# Patient Record
Sex: Male | Born: 1965 | Race: White | Hispanic: No | Marital: Married | Smoking: Never smoker
Health system: Southern US, Community
[De-identification: ages and names within clinical notes are randomized; demographics above are authoritative.]

## PROBLEM LIST (undated history)

## (undated) DIAGNOSIS — B029 Zoster without complications: Secondary | ICD-10-CM

## (undated) DIAGNOSIS — M25539 Pain in unspecified wrist: Secondary | ICD-10-CM

## (undated) DIAGNOSIS — E8881 Metabolic syndrome: Secondary | ICD-10-CM

## (undated) DIAGNOSIS — M1990 Unspecified osteoarthritis, unspecified site: Secondary | ICD-10-CM

## (undated) DIAGNOSIS — R739 Hyperglycemia, unspecified: Secondary | ICD-10-CM

## (undated) DIAGNOSIS — R609 Edema, unspecified: Secondary | ICD-10-CM

## (undated) DIAGNOSIS — I10 Essential (primary) hypertension: Secondary | ICD-10-CM

## (undated) DIAGNOSIS — R51 Headache: Secondary | ICD-10-CM

## (undated) DIAGNOSIS — G4730 Sleep apnea, unspecified: Secondary | ICD-10-CM

## (undated) DIAGNOSIS — E250 Congenital adrenogenital disorders associated with enzyme deficiency: Secondary | ICD-10-CM

## (undated) DIAGNOSIS — E7800 Pure hypercholesterolemia, unspecified: Secondary | ICD-10-CM

## (undated) HISTORY — DX: Unspecified osteoarthritis, unspecified site: M19.90

## (undated) HISTORY — DX: Metabolic syndrome: E88.810

## (undated) HISTORY — DX: Metabolic syndrome: E88.81

## (undated) HISTORY — DX: Zoster without complications: B02.9

---

## 1984-12-20 HISTORY — PX: APPENDECTOMY: SHX54

## 1999-04-06 ENCOUNTER — Ambulatory Visit (HOSPITAL_COMMUNITY): Admission: RE | Admit: 1999-04-06 | Discharge: 1999-04-06 | Payer: Self-pay | Admitting: Family Medicine

## 1999-04-06 ENCOUNTER — Encounter: Payer: Self-pay | Admitting: Family Medicine

## 1999-12-21 HISTORY — PX: KNEE ARTHROSCOPY: SUR90

## 2000-05-31 ENCOUNTER — Ambulatory Visit (HOSPITAL_COMMUNITY): Admission: RE | Admit: 2000-05-31 | Discharge: 2000-05-31 | Payer: Self-pay | Admitting: Family Medicine

## 2000-05-31 ENCOUNTER — Encounter: Payer: Self-pay | Admitting: Family Medicine

## 2002-07-23 ENCOUNTER — Encounter: Admission: RE | Admit: 2002-07-23 | Discharge: 2002-07-23 | Payer: Self-pay | Admitting: *Deleted

## 2002-07-23 ENCOUNTER — Encounter: Payer: Self-pay | Admitting: *Deleted

## 2002-07-31 ENCOUNTER — Ambulatory Visit (HOSPITAL_BASED_OUTPATIENT_CLINIC_OR_DEPARTMENT_OTHER): Admission: RE | Admit: 2002-07-31 | Discharge: 2002-07-31 | Payer: Self-pay | Admitting: Orthopaedic Surgery

## 2004-11-19 ENCOUNTER — Encounter: Admission: RE | Admit: 2004-11-19 | Discharge: 2004-11-19 | Payer: Self-pay | Admitting: Family Medicine

## 2006-10-06 ENCOUNTER — Emergency Department (HOSPITAL_COMMUNITY): Admission: EM | Admit: 2006-10-06 | Discharge: 2006-10-06 | Payer: Self-pay | Admitting: Emergency Medicine

## 2006-11-14 ENCOUNTER — Encounter: Admission: RE | Admit: 2006-11-14 | Discharge: 2007-02-12 | Payer: Self-pay | Admitting: Specialist

## 2007-03-27 ENCOUNTER — Encounter: Admission: RE | Admit: 2007-03-27 | Discharge: 2007-06-25 | Payer: Self-pay | Admitting: Specialist

## 2008-05-24 ENCOUNTER — Emergency Department (HOSPITAL_COMMUNITY): Admission: AC | Admit: 2008-05-24 | Discharge: 2008-05-24 | Payer: Self-pay | Admitting: Emergency Medicine

## 2011-01-09 ENCOUNTER — Encounter: Payer: Self-pay | Admitting: Family Medicine

## 2011-05-07 NOTE — Op Note (Signed)
NAME:  Brian Newton, Brian Newton NO.:  [id number]   MEDICAL RECORD NO.:  [id number]                    PATIENT TYPE:   LOCATION:                                       FACILITY:   PHYSICIAN:  Lubertha Basque. Jerl Santos, M.D.             DATE OF BIRTH:   DATE OF PROCEDURE:  DATE OF DISCHARGE:                                 OPERATIVE REPORT   DIAGNOSES:  1. Right knee torn medial meniscus.  2. Right knee chondromalacia.   PROCEDURES:  1. Right knee partial medial meniscectomy.  2. Right knee chondroplasty of patellofemoral joint.   ANESTHESIA:  Knee block and MAC.   ATTENDING SURGEON:  Lubertha Basque. Jerl Santos, M.D.   ASSISTANT:  Prince Rome, P.A.   INDICATIONS FOR PROCEDURE:  The patient is a 45 year old male with a long  history of right knee pain and swelling.  This has persisted despite oral  anti-inflammatories, a brace, and injection which did help for a short  period of time.  He has undergone an MRI scan, which shows a complex tear of  the posterior horn of the medial meniscus.  He is offered an arthroscopy.  The procedure was discussed with the patient and an informed operative  consent was obtained, after discussion with possible complications of  reaction to anesthesia and infection.   DESCRIPTION OF PROCEDURE:  The patient was taken to the operating suite,  where a knee block was applied without difficulty.  He was also given some  MAC.  He was positioned supine and prepped and draped in normal sterile  fashion.  After the administration of preoperative antibiotics, an  arthroscopy of the right knee was performed through two inferior portals.  Suprapatellar pouch, one with __________ mild chondromalacia addressed with  a brief chondroplasty.  Medial compartment exhibited a complex tear of the  posterior horn of the medial meniscus.  This was also involved with a  portion of the middle horn.  A 15% partial medial meniscectomy was  performed.  There  were minimal degenerative changes in this compartment.  Lateral compartment was completely benign and the ACL and PCL were intact in  the notch.  The knee was thoroughly irrigated at the end of the case,  followed the placement of Marcaine with epinephrine and morphine.  Adaptic  was sewn through the portals, followed by dry gauze and a loose Ace wrap.  The estimated blood loss and intraoperative fluids can be obtained from the  anesthesia records.   DISPOSITION:  The patient was taken to recovery room in stable condition.    PLAN:  It was allowed for him to go home the same day and follow up in the  office in one week.  I will contact him by phone tonight.  Lubertha Basque Jerl Santos, M.D.    PGD/MEDQ  D:  07/31/2002  T:  08/02/2002  Job:  [id number]

## 2011-09-16 LAB — POCT I-STAT, CHEM 8
Chloride: 105
Glucose, Bld: 123 — ABNORMAL HIGH
HCT: 50
Hemoglobin: 17
Potassium: 3.6
Sodium: 139

## 2011-09-16 LAB — URINALYSIS, ROUTINE W REFLEX MICROSCOPIC
Nitrite: NEGATIVE
Specific Gravity, Urine: 1.016
Urobilinogen, UA: 1
pH: 7

## 2011-09-16 LAB — DIFFERENTIAL
Basophils Absolute: 0
Basophils Relative: 1
Eosinophils Absolute: 0.2
Monocytes Relative: 8
Neutro Abs: 4.6
Neutrophils Relative %: 57

## 2011-09-16 LAB — CBC
MCHC: 34.5
Platelets: 255
RBC: 5.02

## 2013-01-24 ENCOUNTER — Other Ambulatory Visit: Payer: Self-pay | Admitting: Gastroenterology

## 2013-02-23 ENCOUNTER — Ambulatory Visit (HOSPITAL_COMMUNITY)
Admission: RE | Admit: 2013-02-23 | Payer: Federal, State, Local not specified - PPO | Source: Ambulatory Visit | Admitting: Gastroenterology

## 2013-02-23 ENCOUNTER — Encounter (HOSPITAL_COMMUNITY): Admission: RE | Payer: Self-pay | Source: Ambulatory Visit

## 2013-02-23 SURGERY — COLONOSCOPY
Anesthesia: Moderate Sedation

## 2013-02-26 ENCOUNTER — Other Ambulatory Visit: Payer: Self-pay | Admitting: Gastroenterology

## 2013-03-01 ENCOUNTER — Encounter (HOSPITAL_COMMUNITY): Payer: Self-pay

## 2013-03-02 ENCOUNTER — Encounter (HOSPITAL_COMMUNITY)
Admission: RE | Disposition: A | Discharge status: Home / Self Care | Payer: Self-pay | Source: Ambulatory Visit | Attending: Gastroenterology

## 2013-03-02 ENCOUNTER — Encounter (HOSPITAL_COMMUNITY): Payer: Self-pay

## 2013-03-02 ENCOUNTER — Ambulatory Visit (HOSPITAL_COMMUNITY)
Admission: RE | Admit: 2013-03-02 | Discharge: 2013-03-02 | Disposition: A | Discharge status: Home / Self Care | Payer: Federal, State, Local not specified - PPO | Source: Ambulatory Visit | Attending: Gastroenterology | Admitting: Gastroenterology

## 2013-03-02 DIAGNOSIS — E7800 Pure hypercholesterolemia, unspecified: Secondary | ICD-10-CM | POA: Insufficient documentation

## 2013-03-02 DIAGNOSIS — D371 Neoplasm of uncertain behavior of stomach: Secondary | ICD-10-CM | POA: Insufficient documentation

## 2013-03-02 DIAGNOSIS — K5730 Diverticulosis of large intestine without perforation or abscess without bleeding: Secondary | ICD-10-CM | POA: Insufficient documentation

## 2013-03-02 DIAGNOSIS — I10 Essential (primary) hypertension: Secondary | ICD-10-CM | POA: Insufficient documentation

## 2013-03-02 HISTORY — DX: Congenital adrenogenital disorders associated with enzyme deficiency: E25.0

## 2013-03-02 HISTORY — DX: Essential (primary) hypertension: I10

## 2013-03-02 HISTORY — DX: Pure hypercholesterolemia, unspecified: E78.00

## 2013-03-02 HISTORY — DX: Headache: R51

## 2013-03-02 HISTORY — PX: COLONOSCOPY: SHX5424

## 2013-03-02 SURGERY — COLONOSCOPY
Anesthesia: Moderate Sedation

## 2013-03-02 MED ORDER — FENTANYL CITRATE 0.05 MG/ML IJ SOLN
INTRAMUSCULAR | Status: AC
  Filled 2013-03-02: qty 2

## 2013-03-02 MED ORDER — FENTANYL CITRATE 0.05 MG/ML IJ SOLN
INTRAMUSCULAR | Status: DC | PRN
  Administered 2013-03-02 (×3): 25 ug via INTRAVENOUS

## 2013-03-02 MED ORDER — MIDAZOLAM HCL 10 MG/2ML IJ SOLN
INTRAMUSCULAR | Status: AC
  Filled 2013-03-02: qty 2

## 2013-03-02 MED ORDER — MIDAZOLAM HCL 5 MG/5ML IJ SOLN
INTRAMUSCULAR | Status: DC | PRN
  Administered 2013-03-02: 2 mg via INTRAVENOUS
  Administered 2013-03-02: 3 mg via INTRAVENOUS
  Administered 2013-03-02: 2 mg via INTRAVENOUS

## 2013-03-02 MED ORDER — DIPHENHYDRAMINE HCL 50 MG/ML IJ SOLN
INTRAMUSCULAR | Status: AC
  Filled 2013-03-02: qty 1

## 2013-03-02 MED ORDER — SODIUM CHLORIDE 0.9 % IV SOLN
INTRAVENOUS | Status: DC
  Administered 2013-03-02: 500 mL via INTRAVENOUS

## 2013-03-02 NOTE — H&P (Signed)
  Brian Newton HPI: The patient has a history of an adenoma in the past and he is here to have a repeat colonoscopy.  As a result of his weight gain he is undergoing the procedure at the hospital.  Past Medical History  Diagnosis Date  . Hypertension   . 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 Headache   . Hypercholesteremia     Past Surgical History  Procedure Laterality Date  . Appendectomy    . Ankle arthroscopy      History reviewed. No pertinent family history.  Social History:  has no tobacco, alcohol, and drug history on file.  Allergies:  Allergies  Allergen Reactions  . Penicillins Rash    Medications:  Scheduled:  Continuous: . sodium chloride 500 mL (03/02/13 1027)    No results found for this or any previous visit (from the past 24 hour(s)).   No results found.  ROS:  As stated above in the HPI otherwise negative.  Blood pressure 151/92, pulse 76, temperature 98.2 F (36.8 C), temperature source Oral, resp. rate 21, height 5\' 5"  (1.651 m), weight 330 lb (149.687 kg), SpO2 94.00%.    PE: Gen: NAD, Alert and Oriented HEENT:  /AT, EOMI Neck: Supple, no LAD, thick neck Lungs: CTA Bilaterally CV: RRR without M/G/R ABM: Soft, morbidly obese, +BS Ext: No C/C/E  Assessment/Plan: 1) Personal history of polyps.  Plan: 1) Colonoscopy.  HUNG,PATRICK D 03/02/2013, 11:37 AM

## 2013-03-02 NOTE — Op Note (Signed)
[facility], [facility] [address] [place], [address]   COLONOSCOPY PROCEDURE REPORT  PATIENT: Brian Newton, Brian Newton  MR#: [id number] BIRTHDATE: [birth year] , 46  yrs. old GENDER: Male ENDOSCOPIST: Jeani Hawking, MD REFERRED BY: PROCEDURE DATE:  03/02/2013 PROCEDURE:   Colonoscopy with snare polypectomy ASA CLASS:   Class III INDICATIONS:Patient's personal history of colon polyps. MEDICATIONS: Versed 7 mg IV and Fentanyl 75 mcg IV  DESCRIPTION OF PROCEDURE:   After the risks benefits and alternatives of the procedure were thoroughly explained, informed consent was obtained.  A digital rectal exam revealed no abnormalities of the rectum.   The Pentax Colonoscope [id number] endoscope was introduced through the anus and advanced to the cecum, which was identified by both the appendix and ileocecal valve. No adverse events experienced.   The quality of the prep was good.  The instrument was then slowly withdrawn as the colon was fully examined.      FINDINGS: A 3 mm sessile proximal transverse colon polyp was removed with a cold snare.  A few left sided diverticula were identified. No other abnormalities noted.  Retroflexed views revealed internal/external hemorrhoids. The time to cecum=  .  Withdrawal time=  .  The scope was withdrawn and the procedure completed. COMPLICATIONS: There were no complications.  ENDOSCOPIC IMPRESSION: 1) Transverse colon polyp, but the specimen was not able to be retrieved.  It broke up during the suctioning process.  RECOMMENDATIONS: 1) Repeat the colonoscopy in 5 years.   eSigned:  Jeani Hawking, MD 03/02/2013 12:25 PM   cc:

## 2013-03-02 NOTE — Progress Notes (Signed)
Polyp removed by Dr Elnoria Howard with cold snare and suctioned into trap . Fluid  Passed through filter no polyp found . Dr Elnoria Howard notified polyp not retrived.

## 2013-03-05 ENCOUNTER — Encounter (HOSPITAL_COMMUNITY): Payer: Self-pay | Admitting: Gastroenterology

## 2013-04-03 ENCOUNTER — Telehealth: Payer: Self-pay | Admitting: Family Medicine

## 2013-04-03 MED ORDER — PRAVASTATIN SODIUM 40 MG PO TABS: 40.0000 mg | ORAL_TABLET | Freq: Every day | ORAL | Status: DC

## 2013-04-03 MED ORDER — AMLODIPINE BESY-BENAZEPRIL HCL 5-20 MG PO CAPS: 2.0000 | ORAL_CAPSULE | Freq: Every morning | ORAL | Status: DC

## 2013-04-03 NOTE — Telephone Encounter (Signed)
Rx Refilled  

## 2013-04-11 ENCOUNTER — Other Ambulatory Visit (INDEPENDENT_AMBULATORY_CARE_PROVIDER_SITE_OTHER): Payer: Federal, State, Local not specified - PPO

## 2013-04-11 ENCOUNTER — Other Ambulatory Visit: Payer: Self-pay | Admitting: Family Medicine

## 2013-04-11 DIAGNOSIS — E785 Hyperlipidemia, unspecified: Secondary | ICD-10-CM

## 2013-04-11 DIAGNOSIS — E291 Testicular hypofunction: Secondary | ICD-10-CM

## 2013-04-11 DIAGNOSIS — Z0000 Encounter for general adult medical examination without abnormal findings: Secondary | ICD-10-CM

## 2013-04-11 DIAGNOSIS — Z79899 Other long term (current) drug therapy: Secondary | ICD-10-CM

## 2013-04-11 DIAGNOSIS — I10 Essential (primary) hypertension: Secondary | ICD-10-CM

## 2013-04-11 LAB — CBC WITH DIFFERENTIAL/PLATELET
Basophils Absolute: 0 10*3/uL (ref 0.0–0.1)
Eosinophils Relative: 3 % (ref 0–5)
HCT: 44.5 % (ref 39.0–52.0)
Hemoglobin: 14.8 g/dL (ref 13.0–17.0)
Lymphocytes Relative: 46 % (ref 12–46)
MCV: 89 fL (ref 78.0–100.0)
Monocytes Absolute: 0.4 10*3/uL (ref 0.1–1.0)
Monocytes Relative: 7 % (ref 3–12)
RDW: 14.5 % (ref 11.5–15.5)
WBC: 5.8 10*3/uL (ref 4.0–10.5)

## 2013-04-11 LAB — LIPID PANEL
HDL: 45 mg/dL (ref >39)
LDL Cholesterol: 70 mg/dL (ref 0–99)
Total CHOL/HDL Ratio: 2.8 Ratio
VLDL: 11 mg/dL (ref 0–40)

## 2013-04-11 LAB — TESTOSTERONE: Testosterone: 162 ng/dL — ABNORMAL LOW (ref 300–890)

## 2013-04-12 LAB — COMPLETE METABOLIC PANEL WITH GFR
AST: 21 U/L (ref 0–37)
Albumin: 4.1 g/dL (ref 3.5–5.2)
Alkaline Phosphatase: 73 U/L (ref 39–117)
BUN: 14 mg/dL (ref 6–23)
Potassium: 4.5 mEq/L (ref 3.5–5.3)
Sodium: 139 mEq/L (ref 135–145)
Total Protein: 6.5 g/dL (ref 6.0–8.3)

## 2013-04-19 ENCOUNTER — Ambulatory Visit: Payer: Self-pay | Admitting: Family Medicine

## 2013-04-25 ENCOUNTER — Encounter: Payer: Self-pay | Admitting: Family Medicine

## 2013-04-25 ENCOUNTER — Ambulatory Visit (INDEPENDENT_AMBULATORY_CARE_PROVIDER_SITE_OTHER): Payer: Federal, State, Local not specified - PPO | Admitting: Family Medicine

## 2013-04-25 VITALS — BP 132/80 | HR 72 | Temp 98.0°F | Resp 20 | Wt 342.0 lb

## 2013-04-25 DIAGNOSIS — I10 Essential (primary) hypertension: Secondary | ICD-10-CM

## 2013-04-25 DIAGNOSIS — E291 Testicular hypofunction: Secondary | ICD-10-CM

## 2013-04-25 DIAGNOSIS — E250 Congenital adrenogenital disorders associated with enzyme deficiency: Secondary | ICD-10-CM | POA: Insufficient documentation

## 2013-04-25 DIAGNOSIS — M7750 Other enthesopathy of unspecified foot: Secondary | ICD-10-CM

## 2013-04-25 DIAGNOSIS — M7742 Metatarsalgia, left foot: Secondary | ICD-10-CM

## 2013-04-25 DIAGNOSIS — E7800 Pure hypercholesterolemia, unspecified: Secondary | ICD-10-CM | POA: Insufficient documentation

## 2013-04-25 DIAGNOSIS — E8881 Metabolic syndrome: Secondary | ICD-10-CM

## 2013-04-25 DIAGNOSIS — B353 Tinea pedis: Secondary | ICD-10-CM

## 2013-04-25 NOTE — Progress Notes (Signed)
Subjective:    Patient ID: Brian Newton, male    DOB: 02/27/1966, 47 y.o.   MRN: [id number]  HPI Patient reports pain in the lateral aspect of his left foot along the sixth menopausal now for several months. He denies any specific injury. He denies any swelling or erythema. The pain is burning and constant in nature. It is worse with prolonged standing and walking. He tried over-the-counter anti-inflammatories with little success. He also has significant athlete's foot on both feet. Is a red rash with serpiginous borders and white scale. It covers all his toes and the plantar aspects of both feet. He is also discuss his metabolic syndrome, hyperlipidemia, and hypertension. His medication list is reviewed and I reviewed a CMP, fasting lipid panel, and testosterone level with the patient. His labs were significant for an elevated blood sugar of 105. Have his fasting lipid panel looks excellent. His LDL is well below 100. His triglycerides are within normal limits. He is no evidence of liver irritation. He denies any right upper quadrant pain or myalgias on pravastatin. His blood pressure is currently well controlled. He denies any chest pain short of breath or dyspnea on exertion. Past Medical History  Diagnosis Date  . 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 Headache   . Hypercholesteremia   . Hypertension   . Metabolic syndrome    Current Outpatient Prescriptions on File Prior to Visit  Medication Sig Dispense Refill  . amLODipine-benazepril (LOTREL) 5-20 MG per capsule Take 2 capsules by mouth every morning.  60 capsule  5  . aspirin 81 MG tablet Take 81 mg by mouth daily.      . cholecalciferol (VITAMIN D) 1000 UNITS tablet Take 1,000 Units by mouth daily.      Marland Kitchen glucosamine-chondroitin 500-400 MG tablet Take 1 tablet by mouth every morning.      . loratadine (CLARITIN) 10 MG tablet Take 10 mg by mouth every morning.      . metoprolol succinate (TOPROL-XL) 25 MG 24 hr tablet  Take 25 mg by mouth every morning.      . Multiple Vitamins-Minerals (MULTIVITAMIN WITH MINERALS) tablet Take 1 tablet by mouth daily.      . Omega-3 Fatty Acids (FISH OIL) 1000 MG CAPS Take 1,000 mg by mouth daily.      . pravastatin (PRAVACHOL) 40 MG tablet Take 1 tablet (40 mg total) by mouth daily.  30 tablet  5   No current facility-administered medications on file prior to visit.   Allergies  Allergen Reactions  . Penicillins Rash   History   Social History  . Marital Status: Married    Spouse Name: N/A    Number of Children: N/A  . Years of Education: N/A   Occupational History  . Not on file.   Social History Main Topics  . Smoking status: Never Smoker   . Smokeless tobacco: Not on file  . Alcohol Use: No  . Drug Use: No  . Sexually Active: Not on file   Other Topics Concern  . Not on file   Social History Narrative  . No narrative on file      Review of Systems  All other systems reviewed and are negative.       Objective:   Physical Exam  Constitutional: He is oriented to person, place, and time. He appears well-developed and well-nourished.  Cardiovascular: Normal rate, regular rhythm and normal heart sounds.   No murmur heard. Pulmonary/Chest: Effort normal and breath sounds normal.  No respiratory distress. He has no wheezes. He has no rales.  Abdominal: Soft. Bowel sounds are normal. He exhibits no distension. There is no tenderness. There is no rebound and no guarding.  Musculoskeletal: He exhibits tenderness.  Neurological: He is alert and oriented to person, place, and time.  Skin: Rash noted.   patient has significant tenia pedis on both feet.  I watched the patient ambulate in the hallway. He suppinates his feet when he walks. He carries his weight primarily on the fifth metatarsal. He does not pronate with his gait.        Assessment & Plan:  1. Metabolic syndrome Discussed therapeutic lifestyle changes including weight loss. Also  recommended the patient discuss bariatric surgery with [facility].  Patient states he will call the clinic and possibly attend mother seminars.  2. HTN (hypertension) Blood pressure is currently well controlled continue present medications.  3. Hypogonadism male Testosterone remains low, but the patient defers treatment as he saw little benefit when he took testosterone in the past and his hemoglobin became polycythemic.  4. Morbid obesity See problem number1  5. Tinea pedis Lotrimin cream twice a day x2 weeks  6. Metatarsalgia, left The patient would benefit from orthotics which we correct his hyper supination and take pressure off the lateral aspect of his midfoot. - Ambulatory referral to Podiatry

## 2013-05-17 ENCOUNTER — Ambulatory Visit: Payer: Federal, State, Local not specified - PPO | Admitting: Physician Assistant

## 2013-05-25 ENCOUNTER — Other Ambulatory Visit: Payer: Self-pay | Admitting: Family Medicine

## 2013-05-25 NOTE — Telephone Encounter (Signed)
Medication refilled per protocol. 

## 2013-07-19 ENCOUNTER — Other Ambulatory Visit: Payer: Self-pay | Admitting: Family Medicine

## 2013-07-19 NOTE — Telephone Encounter (Signed)
Medication refilled per protocol. 

## 2013-10-18 ENCOUNTER — Other Ambulatory Visit: Payer: Federal, State, Local not specified - PPO

## 2013-10-18 ENCOUNTER — Telehealth: Payer: Self-pay | Admitting: Family Medicine

## 2013-10-18 ENCOUNTER — Other Ambulatory Visit: Payer: Self-pay | Admitting: Family Medicine

## 2013-10-18 DIAGNOSIS — E8881 Metabolic syndrome: Secondary | ICD-10-CM

## 2013-10-18 DIAGNOSIS — E785 Hyperlipidemia, unspecified: Secondary | ICD-10-CM

## 2013-10-18 DIAGNOSIS — I10 Essential (primary) hypertension: Secondary | ICD-10-CM

## 2013-10-18 DIAGNOSIS — Z79899 Other long term (current) drug therapy: Secondary | ICD-10-CM

## 2013-10-18 LAB — CBC WITH DIFFERENTIAL/PLATELET
Basophils Absolute: 0 10*3/uL (ref 0.0–0.1)
HCT: 46 % (ref 39.0–52.0)
Hemoglobin: 15.3 g/dL (ref 13.0–17.0)
Lymphocytes Relative: 36 % (ref 12–46)
Monocytes Absolute: 0.8 10*3/uL (ref 0.1–1.0)
Neutro Abs: 3.5 10*3/uL (ref 1.7–7.7)
RBC: 5.32 MIL/uL (ref 4.22–5.81)
RDW: 14.8 % (ref 11.5–15.5)
WBC: 6.9 10*3/uL (ref 4.0–10.5)

## 2013-10-18 LAB — COMPLETE METABOLIC PANEL WITH GFR
AST: 24 U/L (ref 0–37)
Alkaline Phosphatase: 74 U/L (ref 39–117)
BUN: 20 mg/dL (ref 6–23)
Creat: 0.94 mg/dL (ref 0.50–1.35)
GFR, Est Non African American: >89 mL/min
Glucose, Bld: 93 mg/dL (ref 70–99)
Potassium: 4.8 mEq/L (ref 3.5–5.3)
Total Bilirubin: 0.6 mg/dL (ref 0.3–1.2)

## 2013-10-18 LAB — LIPID PANEL
Cholesterol: 157 mg/dL (ref 0–200)
Total CHOL/HDL Ratio: 3.3 Ratio
Triglycerides: 97 mg/dL (ref <150)
VLDL: 19 mg/dL (ref 0–40)

## 2013-10-18 MED ORDER — AMLODIPINE BESY-BENAZEPRIL HCL 5-20 MG PO CAPS: 2.0000 | ORAL_CAPSULE | Freq: Every morning | ORAL | Status: DC

## 2013-10-18 MED ORDER — PRAVASTATIN SODIUM 40 MG PO TABS: 40.0000 mg | ORAL_TABLET | Freq: Every day | ORAL | Status: DC

## 2013-10-18 NOTE — Telephone Encounter (Signed)
Needs Amlodipine and Prevastatin called in

## 2013-10-18 NOTE — Telephone Encounter (Signed)
Rx Refilled  

## 2013-10-25 ENCOUNTER — Ambulatory Visit (INDEPENDENT_AMBULATORY_CARE_PROVIDER_SITE_OTHER): Payer: Federal, State, Local not specified - PPO | Admitting: Family Medicine

## 2013-10-25 ENCOUNTER — Encounter: Payer: Self-pay | Admitting: Family Medicine

## 2013-10-25 VITALS — BP 122/80 | HR 80 | Temp 97.3°F | Resp 20 | Ht 65.0 in | Wt 344.0 lb

## 2013-10-25 DIAGNOSIS — M25569 Pain in unspecified knee: Secondary | ICD-10-CM

## 2013-10-25 DIAGNOSIS — E8881 Metabolic syndrome: Secondary | ICD-10-CM

## 2013-10-25 DIAGNOSIS — M25561 Pain in right knee: Secondary | ICD-10-CM

## 2013-10-25 NOTE — Progress Notes (Signed)
Subjective:    Patient ID: Brian Newton, male    DOB: September 10, 1966, 47 y.o.   MRN: [id number]  HPI Patient is a very pleasant 47 year old white male with a history of hypertension, hyperlipidemia, hypogonadism, and metabolic syndrome. He is currently taking Lotrel 5/20 one by mouth daily, Toprol-XL 25 mg by mouth daily and pravastatin 40 mg by mouth daily. He denies any myalgia right quadrant pain. He denies any chest pain, shortness of breath, dyspnea on exertion. His most recent labwork as listed below. He described his blood sugar almost 10 points. Unfortunately his weight remains significantly elevated he has a BMI greater than 50. He is also complaining of medial joint line pain in his right knee. He denies any specific injury. He denies any locking or catching in the knee. He denies any laxity in the knee. He denies any erythema or effusion. This has been a gradual pain that has slowly worsened. Past Medical History  Diagnosis Date  . 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 Headache(784.0)   . Hypercholesteremia   . Hypertension   . Metabolic syndrome    Past Surgical History  Procedure Laterality Date  . Appendectomy    . Ankle arthroscopy    . Colonoscopy N/A 03/02/2013    Procedure: COLONOSCOPY;  Surgeon: Theda Belfast, MD;  Location: [facility];  Service: Endoscopy;  Laterality: N/A;   Current Outpatient Prescriptions on File Prior to Visit  Medication Sig Dispense Refill  . amLODipine-benazepril (LOTREL) 5-20 MG per capsule TAKE (2) CAPSULES BY MOUTH ONCE EVERY MORNING.  60 capsule  2  . aspirin 81 MG tablet Take 81 mg by mouth daily.      . cholecalciferol (VITAMIN D) 1000 UNITS tablet Take 1,000 Units by mouth daily.      . furosemide (LASIX) 20 MG tablet TAKE 1 TABLET BY MOUTH ONCE DAILY AS NEEDED FOR LEG SWELLING.  30 tablet  5  . glucosamine-chondroitin 500-400 MG tablet Take 1 tablet by mouth every morning.      . loratadine (CLARITIN) 10 MG tablet Take 10  mg by mouth every morning.      . metoprolol succinate (TOPROL-XL) 25 MG 24 hr tablet TAKE 1 TABLET BY MOUTH ONCE A DAY.  30 tablet  5  . Multiple Vitamins-Minerals (MULTIVITAMIN WITH MINERALS) tablet Take 1 tablet by mouth daily.      . Omega-3 Fatty Acids (FISH OIL) 1000 MG CAPS Take 1,000 mg by mouth daily.      . pravastatin (PRAVACHOL) 40 MG tablet Take 1 tablet (40 mg total) by mouth daily.  30 tablet  5   No current facility-administered medications on file prior to visit.   Allergies  Allergen Reactions  . Penicillins Rash   History   Social History  . Marital Status: Married    Spouse Name: N/A    Number of Children: N/A  . Years of Education: N/A   Occupational History  . Not on file.   Social History Main Topics  . Smoking status: Never Smoker   . Smokeless tobacco: Not on file  . Alcohol Use: No  . Drug Use: No  . Sexual Activity: Not on file   Other Topics Concern  . Not on file   Social History Narrative  . No narrative on file      Review of Systems  All other systems reviewed and are negative.       Objective:   Physical Exam  Vitals reviewed. Cardiovascular: Normal rate, regular  rhythm and intact distal pulses.  Exam reveals no gallop and no friction rub.   No murmur heard. Pulmonary/Chest: Effort normal and breath sounds normal. No respiratory distress. He has no wheezes. He has no rales. He exhibits no tenderness.  Abdominal: Soft. Bowel sounds are normal. He exhibits no distension. There is no tenderness. There is no rebound and no guarding.  Musculoskeletal: He exhibits no edema.       Right knee: He exhibits decreased range of motion. He exhibits no swelling, no effusion, no erythema, no LCL laxity, normal patellar mobility, normal meniscus and no MCL laxity. Tenderness found. Medial joint line tenderness noted. No lateral joint line, no MCL and no LCL tenderness noted.          Assessment & Plan:  1. Metabolic syndrome Regarding the  patient's metabolic syndrome, his blood pressure is excellent. His cholesterol is very good, and his blood sugar is much improved. The patient's main medical problem now is his obesity. I recommended at least 100 pounds of weight loss. I feel the patient is an excellent candidate for gastric bypass consultation. I recommended he call [facility] and attend one of their information sessions discussing the gastric bypass.  2. Right knee pain I believe the patient has osteoarthritis in the medial joint line. Discussed the options including daily NSAID therapy versus a cortisone injection in the knee. The patient elects to proceed with a cortisone injection.  Using sterile technique I injected a mixture of 2 cc of lidocaine, 2 cc of Marcaine, and 2 cc of 40 mg mL Kenalog into the right knee using a medial approach and sterile technique. The patient tolerated the procedure well without complication.

## 2013-11-19 ENCOUNTER — Other Ambulatory Visit: Payer: Self-pay | Admitting: Family Medicine

## 2013-11-19 MED ORDER — SUMATRIPTAN SUCCINATE 50 MG PO TABS: ORAL_TABLET | ORAL | Status: DC

## 2014-01-03 ENCOUNTER — Other Ambulatory Visit: Payer: Self-pay | Admitting: Family Medicine

## 2014-01-03 NOTE — Telephone Encounter (Signed)
Medication refilled per protocol. 

## 2014-04-01 ENCOUNTER — Encounter: Payer: Self-pay | Admitting: Physician Assistant

## 2014-04-01 ENCOUNTER — Ambulatory Visit (INDEPENDENT_AMBULATORY_CARE_PROVIDER_SITE_OTHER): Payer: Federal, State, Local not specified - PPO | Admitting: Physician Assistant

## 2014-04-01 VITALS — BP 156/96 | HR 106 | Temp 99.1°F | Resp 20 | Wt 352.0 lb

## 2014-04-01 DIAGNOSIS — J988 Other specified respiratory disorders: Secondary | ICD-10-CM

## 2014-04-01 DIAGNOSIS — J029 Acute pharyngitis, unspecified: Secondary | ICD-10-CM

## 2014-04-01 DIAGNOSIS — A499 Bacterial infection, unspecified: Secondary | ICD-10-CM

## 2014-04-01 DIAGNOSIS — B9689 Other specified bacterial agents as the cause of diseases classified elsewhere: Secondary | ICD-10-CM

## 2014-04-01 LAB — RAPID STREP SCREEN (MED CTR MEBANE ONLY): Streptococcus, Group A Screen (Direct): NEGATIVE

## 2014-04-01 MED ORDER — AZITHROMYCIN 250 MG PO TABS: ORAL_TABLET | ORAL | Status: DC

## 2014-04-01 NOTE — Progress Notes (Signed)
Patient ID: Brian Newton MRN: [id number], DOB: 17-Oct-1966, 48 y.o. Date of Encounter: 04/01/2014, 11:35 AM    Chief Complaint:  Chief Complaint  Patient presents with  . cough, sore thorat, congestion    x 4 days  dtr has strep throat     HPI: 48 y.o. year old morbidly obese white male presents with  symptoms as above.  Says Last week his daughter was seen here and was positive for strep. However patient says that he has different symptoms. Says he has had no significant sore throat with this entire illness.  Reports that he started to get sick on Thursday 03/28/14. At that time developed a cough. Says that since then his symptoms are only worsening. He has had worsening cough and also head and nasal congestion. He feels pressure in his face. Says that this morning he was very hoarse but had no sore throat. Says that last night he checked his temperature at different times through the night and got between 99.1-99.6.  Says that his usual work schedule is that he is off Wednesdays and Thursdays. Usually works Fridays through Tuesdays.  Because of this illness he has been out of work since Friday and is still out of work today.     Home Meds: See attached medication section for any medications that were entered at today's visit. The computer does not put those onto this list.The following list is a list of meds entered prior to today's visit.   Current Outpatient Prescriptions on File Prior to Visit  Medication Sig Dispense Refill  . amLODipine-benazepril (LOTREL) 5-20 MG per capsule TAKE (2) CAPSULES BY MOUTH ONCE EVERY MORNING.  60 capsule  2  . aspirin 81 MG tablet Take 81 mg by mouth daily.      . cholecalciferol (VITAMIN D) 1000 UNITS tablet Take 1,000 Units by mouth daily.      . furosemide (LASIX) 20 MG tablet TAKE 1 TABLET BY MOUTH ONCE DAILY AS NEEDED FOR LEG SWELLING.  30 tablet  5  . glucosamine-chondroitin 500-400 MG tablet Take 1 tablet by mouth every morning.      .  loratadine (CLARITIN) 10 MG tablet Take 10 mg by mouth every morning.      . metoprolol succinate (TOPROL-XL) 25 MG 24 hr tablet TAKE 1 TABLET BY MOUTH ONCE A DAY.  30 tablet  5  . Multiple Vitamins-Minerals (MULTIVITAMIN WITH MINERALS) tablet Take 1 tablet by mouth daily.      . Omega-3 Fatty Acids (FISH OIL) 1000 MG CAPS Take 1,000 mg by mouth daily.      . pravastatin (PRAVACHOL) 40 MG tablet Take 1 tablet (40 mg total) by mouth daily.  30 tablet  5  . SUMAtriptan (IMITREX) 50 MG tablet May repeat in 2 hours if headache persists or recurs.  10 tablet  3   No current facility-administered medications on file prior to visit.    Allergies:  Allergies  Allergen Reactions  . Penicillins Rash      Review of Systems: See HPI for pertinent ROS. All other ROS negative.    Physical Exam: Blood pressure 156/96, pulse 106, temperature 99.1 F (37.3 C), temperature source Oral, resp. rate 20, weight 352 lb (159.666 kg), SpO2 95.00%., Body mass index is 58.58 kg/(m^2). General: Morbidly obese white male. Appears in no acute distress. HEENT: Normocephalic, atraumatic, eyes without discharge, sclera non-icteric, nares are without discharge. Bilateral auditory canals clear, TM's are without perforation, pearly grey and translucent with reflective  cone of light bilaterally. Oral cavity moist, posterior pharynx without exudate, erythema, peritonsillar abscess. No Tenderness with percussion of frontal and maxillary sinuses bilaterally.  Neck: Supple. No thyromegaly. No lymphadenopathy. Lungs: Clear bilaterally to auscultation without wheezes, rales, or rhonchi. Breathing is unlabored. Because of his obesity, breath sounds are distant but I hear no wheezes rhonchi or rales. Heart: Regular rhythm. No murmurs, rubs, or gallops. Msk:  Strength and tone normal for age. Extremities/Skin: Warm and dry. Neuro: Alert and oriented X 3. Moves all extremities spontaneously. Gait is normal. CNII-XII grossly in  tact. Psych:  Responds to questions appropriately with a normal affect.   Results for orders placed in visit on 04/01/14  RAPID STREP SCREEN      Result Value Ref Range   Source THROAT     Streptococcus, Group A Screen (Direct) NEG  NEGATIVE     ASSESSMENT AND PLAN:  48 y.o. year old male with  1. Bacterial respiratory infection - azithromycin (ZITHROMAX) 250 MG tablet; Day 1: Take 2 daily.   Days 2-5: Take 1 daily.  Dispense: 6 tablet; Refill: 0  Complete all of  Antibiotic. If Symptoms do not resolve within one week after completion of antibiotic then follow up. Recommend  using over-the-counter Mucinex DM as expectorant and can use decongestants as needed. Gave him a note to be out of work for Friday 03/29/14   Through  Tuesday 04/02/14. He is then already out of work Wednesday he the 15th and  Thursday the 16th. During this time he is to rest and drink increased fluids as well as the above.  2. Sorethroat - Rapid Strep Screen  3. Morbid obesity   Signed, Olean Ree Ailey, [place], [facility] 04/01/2014 11:35 AM

## 2014-04-03 ENCOUNTER — Telehealth: Payer: Self-pay | Admitting: Family Medicine

## 2014-04-03 NOTE — Telephone Encounter (Signed)
Feeling worse.  Taking meds, OTC, tylenol.  Still with fever, cough, congestion.  Feels much worse.

## 2014-04-04 MED ORDER — LEVOFLOXACIN 750 MG PO TABS: 750.0000 mg | ORAL_TABLET | Freq: Every day | ORAL | Status: DC

## 2014-04-04 NOTE — Telephone Encounter (Signed)
I just called pt and discussed his symptoms.  Says he took 2 Azithro on Mon. Took 1 Azithro on [employer] on Wed None yet today. Still with fever.Head /Nasal congestion and mucus  not improved at all. Chestcongestion/cough not improved at all. Told him we will send in a prescription for stronger antibiotic. Offered to give note to be out of work tomorrow. He says at this point he hopes that he can return to work and will followup with [place] if he does need further notes to be out of work.  Prescription for Levaquin 750 mg 1 by mouth daily x10 days #10 with 0 refills --[employer]

## 2014-04-04 NOTE — Telephone Encounter (Signed)
RX to pharmacy 

## 2014-04-18 ENCOUNTER — Other Ambulatory Visit: Payer: Federal, State, Local not specified - PPO

## 2014-04-18 DIAGNOSIS — E250 Congenital adrenogenital disorders associated with enzyme deficiency: Secondary | ICD-10-CM

## 2014-04-18 DIAGNOSIS — E7800 Pure hypercholesterolemia, unspecified: Secondary | ICD-10-CM

## 2014-04-18 DIAGNOSIS — Z0000 Encounter for general adult medical examination without abnormal findings: Secondary | ICD-10-CM

## 2014-04-18 DIAGNOSIS — I10 Essential (primary) hypertension: Secondary | ICD-10-CM

## 2014-04-18 DIAGNOSIS — Z79899 Other long term (current) drug therapy: Secondary | ICD-10-CM

## 2014-04-18 LAB — COMPLETE METABOLIC PANEL WITH GFR
ALK PHOS: 60 U/L (ref 39–117)
ALT: 33 U/L (ref 0–53)
AST: 22 U/L (ref 0–37)
Albumin: 3.9 g/dL (ref 3.5–5.2)
BILIRUBIN TOTAL: 0.4 mg/dL (ref 0.2–1.2)
BUN: 23 mg/dL (ref 6–23)
CO2: 27 mEq/L (ref 19–32)
CREATININE: 0.85 mg/dL (ref 0.50–1.35)
Calcium: 8.9 mg/dL (ref 8.4–10.5)
Chloride: 103 mEq/L (ref 96–112)
GFR, Est Non African American: >89 mL/min
Glucose, Bld: 108 mg/dL — ABNORMAL HIGH (ref 70–99)
Potassium: 4.7 mEq/L (ref 3.5–5.3)
SODIUM: 140 meq/L (ref 135–145)
TOTAL PROTEIN: 6.2 g/dL (ref 6.0–8.3)

## 2014-04-18 LAB — CBC WITH DIFFERENTIAL/PLATELET
BASOS ABS: 0 10*3/uL (ref 0.0–0.1)
BASOS PCT: 0 % (ref 0–1)
Eosinophils Absolute: 0.3 10*3/uL (ref 0.0–0.7)
Eosinophils Relative: 5 % (ref 0–5)
HEMATOCRIT: 46.2 % (ref 39.0–52.0)
Hemoglobin: 15.5 g/dL (ref 13.0–17.0)
LYMPHS PCT: 35 % (ref 12–46)
Lymphs Abs: 2.4 10*3/uL (ref 0.7–4.0)
MCH: 29.1 pg (ref 26.0–34.0)
MCHC: 33.5 g/dL (ref 30.0–36.0)
MCV: 86.8 fL (ref 78.0–100.0)
Monocytes Absolute: 0.7 10*3/uL (ref 0.1–1.0)
Monocytes Relative: 11 % (ref 3–12)
NEUTROS ABS: 3.3 10*3/uL (ref 1.7–7.7)
NEUTROS PCT: 49 % (ref 43–77)
Platelets: 322 10*3/uL (ref 150–400)
RBC: 5.32 MIL/uL (ref 4.22–5.81)
RDW: 14.7 % (ref 11.5–15.5)
WBC: 6.8 10*3/uL (ref 4.0–10.5)

## 2014-04-18 LAB — LIPID PANEL
Cholesterol: 129 mg/dL (ref 0–200)
HDL: 49 mg/dL (ref >39)
LDL CALC: 70 mg/dL (ref 0–99)
TRIGLYCERIDES: 48 mg/dL (ref <150)
Total CHOL/HDL Ratio: 2.6 Ratio
VLDL: 10 mg/dL (ref 0–40)

## 2014-04-18 LAB — TESTOSTERONE: TESTOSTERONE: 123 ng/dL — AB (ref 300–890)

## 2014-04-19 LAB — PSA: PSA: 0.43 ng/mL (ref <=4.00)

## 2014-04-25 ENCOUNTER — Encounter: Payer: Self-pay | Admitting: Family Medicine

## 2014-04-25 ENCOUNTER — Ambulatory Visit (INDEPENDENT_AMBULATORY_CARE_PROVIDER_SITE_OTHER): Payer: Federal, State, Local not specified - PPO | Admitting: Family Medicine

## 2014-04-25 DIAGNOSIS — Z79899 Other long term (current) drug therapy: Secondary | ICD-10-CM

## 2014-04-25 DIAGNOSIS — E8881 Metabolic syndrome: Secondary | ICD-10-CM

## 2014-04-25 DIAGNOSIS — I10 Essential (primary) hypertension: Secondary | ICD-10-CM

## 2014-04-25 DIAGNOSIS — E7800 Pure hypercholesterolemia, unspecified: Secondary | ICD-10-CM

## 2014-04-25 DIAGNOSIS — E291 Testicular hypofunction: Secondary | ICD-10-CM

## 2014-04-25 MED ORDER — PHENTERMINE HCL 37.5 MG PO TABS: 37.5000 mg | ORAL_TABLET | Freq: Every day | ORAL | Status: DC

## 2014-04-25 NOTE — Progress Notes (Signed)
Subjective:    Patient ID: Brian Newton, male    DOB: 03/03/1966, 48 y.o.   MRN: [id number]  HPI Patient is here for follow up of his multiple medical problems including hyperlipidemia, hypertension, morbid obesity, testosterone deficiency, and metabolic syndrome. His blood pressure slightly elevated today at 144/80. His cholesterol is excellent. His fasting blood sugar is slightly elevated at 108.  His most recent labwork as listed below: Lab on 04/18/2014  Component Date Value Ref Range Status  . Cholesterol 04/18/2014 129  0 - 200 mg/dL Final   Comment: ATP III Classification:                                < 200        mg/dL        Desirable                               200 - 239     mg/dL        Borderline High                               >= 240        mg/dL        High                             . Triglycerides 04/18/2014 48  <150 mg/dL Final  . HDL 04/18/2014 49  >39 mg/dL Final  . Total CHOL/HDL Ratio 04/18/2014 2.6   Final  . VLDL 04/18/2014 10  0 - 40 mg/dL Final  . LDL Cholesterol 04/18/2014 70  0 - 99 mg/dL Final   Comment:                            Total Cholesterol/HDL Ratio:CHD Risk                                                 Coronary Heart Disease Risk Table                                                                 Men       Women                                   1/2 Average Risk              3.4        3.3                                       Average Risk              5.0  4.4                                    2X Average Risk              9.6        7.1                                    3X Average Risk             23.4       11.0                          Use the calculated Patient Ratio above and the CHD Risk table                           to determine the patient's CHD Risk.                          ATP III Classification (LDL):                                < 100        mg/dL         Optimal                               100 - 129      mg/dL         Near or Above Optimal                               130 - 159     mg/dL         Borderline High                               160 - 189     mg/dL         High                                > 190        mg/dL         Very High                             . WBC 04/18/2014 6.8  4.0 - 10.5 K/uL Final  . RBC 04/18/2014 5.32  4.22 - 5.81 MIL/uL Final  . Hemoglobin 04/18/2014 15.5  13.0 - 17.0 g/dL Final  . HCT 04/18/2014 46.2  39.0 - 52.0 % Final  . MCV 04/18/2014 86.8  78.0 - 100.0 fL Final  . MCH 04/18/2014 29.1  26.0 - 34.0 pg Final  . MCHC 04/18/2014 33.5  30.0 - 36.0 g/dL Final  . RDW 04/18/2014 14.7  11.5 - 15.5 % Final  . Platelets 04/18/2014 322  150 - 400 K/uL Final  . Neutrophils Relative % 04/18/2014 49  43 - 77 %  Final  . Neutro Abs 04/18/2014 3.3  1.7 - 7.7 K/uL Final  . Lymphocytes Relative 04/18/2014 35  12 - 46 % Final  . Lymphs Abs 04/18/2014 2.4  0.7 - 4.0 K/uL Final  . Monocytes Relative 04/18/2014 11  3 - 12 % Final  . Monocytes Absolute 04/18/2014 0.7  0.1 - 1.0 K/uL Final  . Eosinophils Relative 04/18/2014 5  0 - 5 % Final  . Eosinophils Absolute 04/18/2014 0.3  0.0 - 0.7 K/uL Final  . Basophils Relative 04/18/2014 0  0 - 1 % Final  . Basophils Absolute 04/18/2014 0.0  0.0 - 0.1 K/uL Final  . Smear Review 04/18/2014 Criteria for review not met   Final  . Testosterone 04/18/2014 123* 300 - 890 ng/dL Final   Comment:           [facility]       Male              Male                                        I              < 30 ng/dL        < 10 ng/dL                                        II             < 150 ng/dL       < 30 ng/dL                                        III            100-320 ng/dL     < 35 ng/dL                                        IV             200-970 ng/dL     15-40 ng/dL                                        V/Adult        300-890 ng/dL     10-70 ng/dL                             . PSA 04/18/2014 0.43  <=4.00 ng/mL Final    Comment: Test Methodology: ECLIA PSA (Electrochemiluminescence Immunoassay)                                                     For PSA values from 2.5-4.0, particularly in younger men <60 years  old, the AUA and NCCN suggest testing for % Free PSA (3515) and                          evaluation of the rate of increase in PSA (PSA velocity).  . Sodium 04/18/2014 140  135 - 145 mEq/L Final  . Potassium 04/18/2014 4.7  3.5 - 5.3 mEq/L Final  . Chloride 04/18/2014 103  96 - 112 mEq/L Final  . CO2 04/18/2014 27  19 - 32 mEq/L Final  . Glucose, Bld 04/18/2014 108* 70 - 99 mg/dL Final  . BUN 04/18/2014 23  6 - 23 mg/dL Final  . Creat 04/18/2014 0.85  0.50 - 1.35 mg/dL Final  . Total Bilirubin 04/18/2014 0.4  0.2 - 1.2 mg/dL Final  . Alkaline Phosphatase 04/18/2014 60  39 - 117 U/L Final  . AST 04/18/2014 22  0 - 37 U/L Final  . ALT 04/18/2014 33  0 - 53 U/L Final  . Total Protein 04/18/2014 6.2  6.0 - 8.3 g/dL Final  . Albumin 04/18/2014 3.9  3.5 - 5.2 g/dL Final  . Calcium 04/18/2014 8.9  8.4 - 10.5 mg/dL Final  . GFR, Est African American 04/18/2014 >89   Final  . GFR, Est Non African American 04/18/2014 >89   Final   Comment:                            The estimated GFR is a calculation valid for adults (>=55 years old)                          that uses the CKD-EPI algorithm to adjust for age and sex. It is                            not to be used for children, pregnant women, hospitalized patients,                             patients on dialysis, or with rapidly changing kidney function.                          According to the NKDEP, eGFR >89 is normal, 60-89 shows mild                          impairment, 30-59 shows moderate impairment, 15-29 shows severe                          impairment and <15 is ESRD.                             Office Visit on 04/01/2014  Component Date Value Ref Range Status  . Source 04/01/2014 THROAT   Final  . Streptococcus,  Group A Screen (Dir* 04/01/2014 NEG  NEGATIVE Final   Comment:                            A Rapid Antigen test may result negative if the antigen level in the  sample is below the detection level of this test. The FDA has not                          cleared this test as a stand-alone test therefore the rapid antigen                          negative result has reflexed to a Group A Strep culture, unit code                          70060.                               Unfortunately his weight continues to go up. He now has a BMI greater than 50. He is trying diet and exercise although is not able to exercise very often because of severe pain in his right knee due to his weight, severe osteoarthritis, and low back pain. He is now interested in bariatric surgery along with weight loss medication Wt Readings from Last 3 Encounters:  04/25/14 352 lb (159.666 kg)  04/01/14 352 lb (159.666 kg)  10/25/13 344 lb (156.037 kg)   Past Medical History  Diagnosis Date  . 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 Headache(784.0)   . Hypercholesteremia   . Hypertension   . Metabolic syndrome    Past Surgical History  Procedure Laterality Date  . Appendectomy    . Ankle arthroscopy    . Colonoscopy N/A 03/02/2013    Procedure: COLONOSCOPY;  Surgeon: Beryle Beams, MD;  Location: [facility];  Service: Endoscopy;  Laterality: N/A;   Current Outpatient Prescriptions on File Prior to Visit  Medication Sig Dispense Refill  . amLODipine-benazepril (LOTREL) 5-20 MG per capsule TAKE (2) CAPSULES BY MOUTH ONCE EVERY MORNING.  60 capsule  2  . aspirin 81 MG tablet Take 81 mg by mouth daily.      . cholecalciferol (VITAMIN D) 1000 UNITS tablet Take 1,000 Units by mouth daily.      . furosemide (LASIX) 20 MG tablet TAKE 1 TABLET BY MOUTH ONCE DAILY AS NEEDED FOR LEG SWELLING.  30 tablet  5  . glucosamine-chondroitin 500-400 MG tablet Take 1 tablet by mouth every morning.       . loratadine (CLARITIN) 10 MG tablet Take 10 mg by mouth every morning.      . metoprolol succinate (TOPROL-XL) 25 MG 24 hr tablet TAKE 1 TABLET BY MOUTH ONCE A DAY.  30 tablet  5  . Multiple Vitamins-Minerals (MULTIVITAMIN WITH MINERALS) tablet Take 1 tablet by mouth daily.      . Omega-3 Fatty Acids (FISH OIL) 1000 MG CAPS Take 1,000 mg by mouth daily.      . pravastatin (PRAVACHOL) 40 MG tablet Take 1 tablet (40 mg total) by mouth daily.  30 tablet  5  . SUMAtriptan (IMITREX) 50 MG tablet May repeat in 2 hours if headache persists or recurs.  10 tablet  3   No current facility-administered medications on file prior to visit.   Allergies  Allergen Reactions  . Penicillins Rash   History   Social History  . Marital Status: Married    Spouse Name: N/A    Number of Children: N/A  . Years of Education: N/A   Occupational History  . Not on file.   Social  History Main Topics  . Smoking status: Never Smoker   . Smokeless tobacco: Never Used  . Alcohol Use: No  . Drug Use: No  . Sexual Activity: Yes   Other Topics Concern  . Not on file   Social History Narrative  . No narrative on file     Review of Systems  All other systems reviewed and are negative.      Objective:   Physical Exam  Vitals reviewed. Constitutional: He appears well-developed and well-nourished.  Cardiovascular: Normal rate, regular rhythm and normal heart sounds.   No murmur heard. Pulmonary/Chest: Effort normal and breath sounds normal. No respiratory distress. He has no wheezes. He has no rales.  Abdominal: Soft. Bowel sounds are normal. He exhibits no distension. There is no tenderness. There is no rebound and no guarding.  Musculoskeletal: He exhibits edema.          Assessment & Plan:  Morbid obesity - Plan: phentermine (ADIPEX-P) 37.5 MG tablet, Ambulatory referral to General Surgery  HTN (hypertension)  Hypercholesteremia  Encounter for long-term (current) use of other  medications  Hypogonadism male  Metabolic syndrome   I had a long discussion with Gerik today regarding his multiple medical problems. Sugar is starting to rise. His blood pressure slightly elevated. His right knee pain is getting worse. I believe his sleep apnea is likely getting worse and we have not checked that in several years. However all these problems are related to his increasing weight. I believe his life is in danger of the next 2 years if we do not see drastic weight reduction. Therefore I have recommended a referral to general surgeon to discuss bariatric surgery. In the meantime will start patient on adipex 37.5 mg poqam for 3 months.  I encouraged the lifestyle changes to address his obesity as well including diet and exercise. I've also discussed with the patient using contrave in the future.

## 2014-05-21 ENCOUNTER — Telehealth: Payer: Self-pay | Admitting: *Deleted

## 2014-05-21 MED ORDER — MELOXICAM 15 MG PO TABS: 15.0000 mg | ORAL_TABLET | Freq: Every day | ORAL | Status: DC

## 2014-05-21 MED ORDER — HYDROCODONE-ACETAMINOPHEN 5-325 MG PO TABS: 1.0000 | ORAL_TABLET | Freq: Four times a day (QID) | ORAL | Status: DC | PRN

## 2014-05-21 NOTE — Telephone Encounter (Signed)
Start Mobic 15 mg poqday for knee pain.  He can use norco 5/325 poq 6 hrs prn severe pain (20).  He can get a cortisone injection as well if he would like or we can also consult ortho.

## 2014-05-21 NOTE — Telephone Encounter (Signed)
Pt called needing pain medication for his left knee  Pharmacy [facility]

## 2014-05-21 NOTE — Telephone Encounter (Signed)
Do you know what he is referring to?

## 2014-05-21 NOTE — Telephone Encounter (Signed)
Pt states that the injections did not help that much so he prefers not to have that done and does not want to see ortho right now. Will try Mobic and Norco and call if no better. Rx printed and left up front for pt and mobic sent to pharm

## 2014-05-23 ENCOUNTER — Other Ambulatory Visit: Payer: Self-pay | Admitting: Family Medicine

## 2014-05-23 NOTE — Telephone Encounter (Signed)
Refill appropriate and filled per protocol. 

## 2014-05-23 NOTE — Telephone Encounter (Signed)
Prescription sent to pharmacy.

## 2014-06-26 ENCOUNTER — Encounter (INDEPENDENT_AMBULATORY_CARE_PROVIDER_SITE_OTHER): Payer: Self-pay | Admitting: General Surgery

## 2014-06-26 ENCOUNTER — Ambulatory Visit (INDEPENDENT_AMBULATORY_CARE_PROVIDER_SITE_OTHER): Payer: Federal, State, Local not specified - PPO | Admitting: General Surgery

## 2014-06-26 NOTE — Progress Notes (Signed)
Subjective:   morbid obesity  Patient ID: Brian Newton, male   DOB: [birth year], 48 y.o.   MRN: [id number]  HPI Patient is a very pleasant 48 year old male referred by Dr. Dennard Schaumann for consideration for surgical treatment for morbid obesity. The patient gives a history of struggling with his weight essentially all of his adult life. He has been through enumerable efforts at physician supervised and unsupervised attempts at weight loss including standardized and exercise programs and medications. He has been able to lose up to 40-50 pounds at a time but then experiences progressive weight regain and additional weight. His current weight of 343 pounds and BMI of 57 is close to his maximum. His weight has begun to affect his life and numerous ways. He feels continually fatigue. He has increasing difficulty performing routine activities of daily life and participating in activities with his family. He has seen progressive deterioration in his health and development of multiple illnesses directly related to his weight including hypertension, elevated cholesterol, degenerative joint disease, obstructive sleep apnea and metabolic syndrome. He is concerned about his long-term health going forward and is currently and is unable to control it with routine measures. He has participated in our on line seminar and has an aunt who has had successful bariatric surgery.  He is open to discussion of various options currently. He denies any significant GI complaints such as abdominal pain or reflux. He has had mildly elevated glucose but no definite diagnosis of diabetes.  Past Medical History  Diagnosis Date  . 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 Headache(784.0)   . Hypercholesteremia   . Hypertension   . Metabolic syndrome   . Arthritis    Past Surgical History  Procedure Laterality Date  . Appendectomy    . Ankle arthroscopy    . Colonoscopy N/A 03/02/2013    Procedure: COLONOSCOPY;  Surgeon:  Beryle Beams, MD;  Location: [facility];  Service: Endoscopy;  Laterality: N/A;   Current Outpatient Prescriptions  Medication Sig Dispense Refill  . amLODipine-benazepril (LOTREL) 5-20 MG per capsule TAKE (2) CAPSULES BY MOUTH ONCE EVERY MORNING.  60 capsule  3  . aspirin 81 MG tablet Take 81 mg by mouth daily.      . cholecalciferol (VITAMIN D) 1000 UNITS tablet Take 1,000 Units by mouth daily.      . furosemide (LASIX) 20 MG tablet TAKE 1 TABLET BY MOUTH ONCE DAILY AS NEEDED FOR LEG SWELLING.  30 tablet  5  . glucosamine-chondroitin 500-400 MG tablet Take 1 tablet by mouth every morning.      Marland Kitchen HYDROcodone-acetaminophen (NORCO) 5-325 MG per tablet Take 1 tablet by mouth every 6 (six) hours as needed for moderate pain.  20 tablet  0  . loratadine (CLARITIN) 10 MG tablet Take 10 mg by mouth every morning.      . meloxicam (MOBIC) 15 MG tablet Take 1 tablet (15 mg total) by mouth daily.  30 tablet  5  . metoprolol succinate (TOPROL-XL) 25 MG 24 hr tablet TAKE 1 TABLET BY MOUTH ONCE A DAY.  30 tablet  5  . Multiple Vitamins-Minerals (MULTIVITAMIN WITH MINERALS) tablet Take 1 tablet by mouth daily.      . Omega-3 Fatty Acids (FISH OIL) 1000 MG CAPS Take 1,000 mg by mouth daily.      . phentermine (ADIPEX-P) 37.5 MG tablet Take 1 tablet (37.5 mg total) by mouth daily before breakfast.  30 tablet  2  . pravastatin (PRAVACHOL) 40 MG tablet TAKE (1)  TABLET BY MOUTH ONCE DAILY.  30 tablet  3  . SUMAtriptan (IMITREX) 50 MG tablet May repeat in 2 hours if headache persists or recurs.  10 tablet  3   No current facility-administered medications for this visit.   Allergies  Allergen Reactions  . Penicillins Rash   History  Substance Use Topics  . Smoking status: Never Smoker   . Smokeless tobacco: Never Used  . Alcohol Use: No     Review of Systems  Constitutional: Positive for fatigue.  HENT: Negative.   Eyes: Negative.   Respiratory: Positive for shortness of breath. Negative for  wheezing and stridor.        Sleep apnea on CPAP  Cardiovascular: Positive for leg swelling. Negative for chest pain and palpitations.  Gastrointestinal: Negative.        Colonoscopy is up-to-date  Genitourinary: Negative.   Musculoskeletal: Positive for arthralgias.  Hematological: Negative.   Psychiatric/Behavioral: Negative.        Objective:   Physical Exam General: Alert, morbidly obese Caucasian male, in no distress Skin: Warm and dry without rash or infection. HEENT: No palpable masses or thyromegaly. Sclera nonicteric. Pupils equal round and reactive. Oropharynx clear. Lymph nodes: No cervical, supraclavicular, or inguinal nodes palpable. Lungs: Breath sounds clear and equal without increased work of breathing Cardiovascular: Regular rate and rhythm without murmur. No JVD or edema. Peripheral pulses intact. Abdomen: Nondistended. Soft and nontender. No masses palpable. No organomegaly. No palpable hernias. Extremities: 1+ lower extremity edema, mild chronic venous stasis changes.  No joint swelling or deformity.  Neurologic: Alert and fully oriented. Gait normal.    Assessment:     48 year old with progressive marked morbid obesity unresponsive to multiple efforts at nonsurgical management. He presents with a BMI of 57 and multiple comorbidities including hypertension, elevated cholesterol, degenerative joint disease, obstructive sleep apnea and metabolic syndrome. We discussed the risks of morbid obesity and of remaining at his current weight.  We had a long discussion regarding surgical treatment offered for morbid obesity including lap band, sleeve gastrectomy, and a gastric bypass. We discussed lap band with advantages of safety and quick return to work but discussed that weight loss has been off any inadequate in our higher BMI patients.  We discussed the sleeve gastrectomy and Roux-en-Y gastric bypass in detail and compared the potential complications of the operations  including anesthetic complications, bleeding, leakage and infection, pulmonary embolus and risk of death. We discussed long-term complications of each including stricture, ulceration, risk of reflux with sleeve gastrectomy, internal hernia with gastric bypass, nutritional deficiencies. We discussed risk of gallstones. All of his and his wife's questions were answered. I believe he would be a good candidate for either sleeve gastrectomy or gastric bypass. We discussed that sleeve gastrectomy has slightly lower risk profile the gastric bypass and he does not have any specific indications for gastric bypass such as insulin dependent diabetes mellitus or reflux. At this point he would like to think over his options a little bit more and discussed it with his family and will get back to me regarding choice of surgical option. He feels strongly that he would like to proceed with surgical intervention and I believe this would be in his best interest with potential significant medical benefits.     Plan:     Patient to consider surgical options and then we will begin preoperative workup based on this decision. I gave him further references for education and urged him to call me  should he have any questions.

## 2014-06-28 ENCOUNTER — Ambulatory Visit (INDEPENDENT_AMBULATORY_CARE_PROVIDER_SITE_OTHER): Payer: Federal, State, Local not specified - PPO | Admitting: Surgery

## 2014-07-01 ENCOUNTER — Other Ambulatory Visit (INDEPENDENT_AMBULATORY_CARE_PROVIDER_SITE_OTHER): Payer: Self-pay

## 2014-07-01 DIAGNOSIS — E119 Type 2 diabetes mellitus without complications: Secondary | ICD-10-CM

## 2014-07-01 DIAGNOSIS — G4733 Obstructive sleep apnea (adult) (pediatric): Secondary | ICD-10-CM

## 2014-07-01 DIAGNOSIS — E785 Hyperlipidemia, unspecified: Secondary | ICD-10-CM

## 2014-07-04 ENCOUNTER — Other Ambulatory Visit (INDEPENDENT_AMBULATORY_CARE_PROVIDER_SITE_OTHER): Payer: Self-pay

## 2014-07-04 ENCOUNTER — Encounter: Payer: Self-pay | Admitting: Dietician

## 2014-07-04 ENCOUNTER — Encounter: Payer: Federal, State, Local not specified - PPO | Attending: General Surgery | Admitting: Dietician

## 2014-07-04 DIAGNOSIS — E119 Type 2 diabetes mellitus without complications: Secondary | ICD-10-CM

## 2014-07-04 DIAGNOSIS — E785 Hyperlipidemia, unspecified: Secondary | ICD-10-CM

## 2014-07-04 DIAGNOSIS — Z713 Dietary counseling and surveillance: Secondary | ICD-10-CM | POA: Insufficient documentation

## 2014-07-04 DIAGNOSIS — G4733 Obstructive sleep apnea (adult) (pediatric): Secondary | ICD-10-CM

## 2014-07-04 DIAGNOSIS — Z6841 Body Mass Index (BMI) 40.0 and over, adult: Secondary | ICD-10-CM | POA: Insufficient documentation

## 2014-07-04 LAB — COMPREHENSIVE METABOLIC PANEL
ALBUMIN: 4.1 g/dL (ref 3.5–5.2)
ALK PHOS: 68 U/L (ref 39–117)
ALT: 36 U/L (ref 0–53)
AST: 23 U/L (ref 0–37)
BUN: 21 mg/dL (ref 6–23)
CALCIUM: 8.9 mg/dL (ref 8.4–10.5)
CHLORIDE: 103 meq/L (ref 96–112)
CO2: 28 mEq/L (ref 19–32)
Creat: 0.88 mg/dL (ref 0.50–1.35)
Glucose, Bld: 105 mg/dL — ABNORMAL HIGH (ref 70–99)
POTASSIUM: 4.5 meq/L (ref 3.5–5.3)
SODIUM: 139 meq/L (ref 135–145)
TOTAL PROTEIN: 6.3 g/dL (ref 6.0–8.3)
Total Bilirubin: 0.4 mg/dL (ref 0.2–1.2)

## 2014-07-04 LAB — CBC WITH DIFFERENTIAL/PLATELET
BASOS PCT: 0 % (ref 0–1)
Basophils Absolute: 0 10*3/uL (ref 0.0–0.1)
Eosinophils Absolute: 0.2 10*3/uL (ref 0.0–0.7)
Eosinophils Relative: 4 % (ref 0–5)
HCT: 43.9 % (ref 39.0–52.0)
Hemoglobin: 15.3 g/dL (ref 13.0–17.0)
Lymphocytes Relative: 35 % (ref 12–46)
Lymphs Abs: 2.1 10*3/uL (ref 0.7–4.0)
MCH: 30.1 pg (ref 26.0–34.0)
MCHC: 34.9 g/dL (ref 30.0–36.0)
MCV: 86.2 fL (ref 78.0–100.0)
MONOS PCT: 10 % (ref 3–12)
Monocytes Absolute: 0.6 10*3/uL (ref 0.1–1.0)
NEUTROS ABS: 3.1 10*3/uL (ref 1.7–7.7)
NEUTROS PCT: 51 % (ref 43–77)
Platelets: 282 10*3/uL (ref 150–400)
RBC: 5.09 MIL/uL (ref 4.22–5.81)
RDW: 14.5 % (ref 11.5–15.5)
WBC: 6 10*3/uL (ref 4.0–10.5)

## 2014-07-04 LAB — T4: T4 TOTAL: 9.3 ug/dL (ref 5.0–12.5)

## 2014-07-04 LAB — HEMOGLOBIN A1C
Hgb A1c MFr Bld: 6.2 % — ABNORMAL HIGH (ref <5.7)
MEAN PLASMA GLUCOSE: 131 mg/dL — AB (ref <117)

## 2014-07-04 LAB — TSH: TSH: 2.116 u[IU]/mL (ref 0.350–4.500)

## 2014-07-04 NOTE — Patient Instructions (Signed)
Follow Pre-Op Goals Try Protein Shakes Call [facility] at [phone] when surgery is scheduled to enroll in Pre-Op Class 

## 2014-07-04 NOTE — Progress Notes (Signed)
  Pre-Op Assessment Visit:  Pre-Operative RYGB Surgery  Medical Nutrition Therapy:  Appt start time: 0800   End time:  0830.  Patient was seen on 07/04/2014 for Pre-Operative RYGB Nutrition Assessment. Assessment and letter of approval faxed to [facility] Surgery Bariatric Surgery Program coordinator on 07/03/2014.   Preferred Learning Style:   No preference indicated   Learning Readiness:   Ready  Handouts given during visit include:  Pre-Op Goals Bariatric Surgery Protein Shakes  Teaching Method Utilized:  Visual Auditory Hands on  Barriers to learning/adherence to lifestyle change: none  Demonstrated degree of understanding via:  Teach Back   Patient to call [facility] to enroll in Pre-Op and Post-Op Nutrition Education when surgery date is scheduled.

## 2014-07-16 ENCOUNTER — Other Ambulatory Visit (HOSPITAL_COMMUNITY): Payer: Federal, State, Local not specified - PPO

## 2014-07-16 ENCOUNTER — Ambulatory Visit (HOSPITAL_COMMUNITY): Payer: Federal, State, Local not specified - PPO

## 2014-08-07 ENCOUNTER — Ambulatory Visit: Payer: Federal, State, Local not specified - PPO | Admitting: Dietician

## 2014-08-07 ENCOUNTER — Other Ambulatory Visit (INDEPENDENT_AMBULATORY_CARE_PROVIDER_SITE_OTHER): Payer: Self-pay | Admitting: General Surgery

## 2014-08-07 ENCOUNTER — Ambulatory Visit (HOSPITAL_COMMUNITY)
Admission: RE | Admit: 2014-08-07 | Discharge: 2014-08-07 | Disposition: A | Discharge status: Home / Self Care | Payer: Federal, State, Local not specified - PPO | Source: Ambulatory Visit | Attending: General Surgery | Admitting: General Surgery

## 2014-08-07 ENCOUNTER — Encounter: Payer: Federal, State, Local not specified - PPO | Attending: General Surgery | Admitting: Dietician

## 2014-08-07 DIAGNOSIS — E119 Type 2 diabetes mellitus without complications: Secondary | ICD-10-CM

## 2014-08-07 DIAGNOSIS — G4733 Obstructive sleep apnea (adult) (pediatric): Secondary | ICD-10-CM | POA: Diagnosis not present

## 2014-08-07 DIAGNOSIS — Z6841 Body Mass Index (BMI) 40.0 and over, adult: Secondary | ICD-10-CM | POA: Insufficient documentation

## 2014-08-07 DIAGNOSIS — Z713 Dietary counseling and surveillance: Secondary | ICD-10-CM | POA: Insufficient documentation

## 2014-08-07 DIAGNOSIS — E785 Hyperlipidemia, unspecified: Secondary | ICD-10-CM

## 2014-08-07 DIAGNOSIS — I10 Essential (primary) hypertension: Secondary | ICD-10-CM | POA: Diagnosis not present

## 2014-08-07 DIAGNOSIS — Z13820 Encounter for screening for osteoporosis: Secondary | ICD-10-CM | POA: Insufficient documentation

## 2014-08-07 DIAGNOSIS — R918 Other nonspecific abnormal finding of lung field: Secondary | ICD-10-CM | POA: Diagnosis not present

## 2014-08-07 DIAGNOSIS — E8881 Metabolic syndrome: Secondary | ICD-10-CM | POA: Diagnosis not present

## 2014-08-07 DIAGNOSIS — M1990 Unspecified osteoarthritis, unspecified site: Secondary | ICD-10-CM | POA: Diagnosis not present

## 2014-08-07 DIAGNOSIS — E7800 Pure hypercholesterolemia, unspecified: Secondary | ICD-10-CM | POA: Diagnosis not present

## 2014-08-07 DIAGNOSIS — I517 Cardiomegaly: Secondary | ICD-10-CM | POA: Diagnosis not present

## 2014-08-07 DIAGNOSIS — K7689 Other specified diseases of liver: Secondary | ICD-10-CM | POA: Insufficient documentation

## 2014-08-07 NOTE — Patient Instructions (Addendum)
-  Practice eating slowly and chewing thoroughly -Exercise as tolerated

## 2014-08-07 NOTE — Progress Notes (Signed)
  Medical Nutrition Therapy:  Appt start time: 1130 end time:  8299.  SWL visit #1:  Primary concerns today:  Javonn returns today with his wife for his 1st SWL visit in preparation for RYGB. He brings a protein shake (Ansi Flurry Protein Shake) from [employer] that meets the protein and carbohydrate criteria. He reports he is having a hard time chewing thoroughly. Not having trouble avoiding drinking while eating. Does not have energy after work to exercise. However, they have a pool at home and [employer] "gets in" a few times a week when the weather is nice.  MEDICATIONS: see list  Recent physical activity: none  Estimated energy needs: 1800-2000 calories  Progress Towards Goal(s):  In progress.   Nutritional Diagnosis:  [place]-3.3 Overweight/obesity related to past poor dietary habits and physical inactivity as evidenced by patient w/ pending RYGB surgery following dietary guidelines for continued weight loss.     Intervention:  Nutrition counseling provided. Reviewed pre op goals.  Monitoring/Evaluation:  Dietary intake, exercise, and body weight in 4 week(s).

## 2014-08-08 ENCOUNTER — Encounter (HOSPITAL_COMMUNITY)
Admission: RE | Disposition: A | Discharge status: Home / Self Care | Payer: Federal, State, Local not specified - PPO | Source: Ambulatory Visit | Attending: General Surgery

## 2014-08-08 ENCOUNTER — Ambulatory Visit (HOSPITAL_COMMUNITY)
Admission: RE | Admit: 2014-08-08 | Discharge: 2014-08-08 | Disposition: A | Discharge status: Home / Self Care | Payer: Federal, State, Local not specified - PPO | Source: Ambulatory Visit | Attending: General Surgery | Admitting: General Surgery

## 2014-08-08 HISTORY — PX: BREATH TEK H PYLORI: SHX5422

## 2014-08-08 SURGERY — BREATH TEST, FOR HELICOBACTER PYLORI

## 2014-08-09 ENCOUNTER — Encounter (HOSPITAL_COMMUNITY): Payer: Self-pay | Admitting: General Surgery

## 2014-08-09 NOTE — Progress Notes (Signed)
08/08/14 [place]  Referring MD [place]  Time of Last PO Intake 2100 (08/07/2014)  The Patient Had The Following Meds In The Last Two Weeks (No)  Baseline Breath At: 0748  Pranactin Given At: 0750  Post-Dose Breath At: 0805  Sample 1 3.7  Sample 2 3.1  Test Negative

## 2014-08-14 ENCOUNTER — Other Ambulatory Visit: Payer: Self-pay | Admitting: Family Medicine

## 2014-09-04 ENCOUNTER — Encounter: Payer: Federal, State, Local not specified - PPO | Attending: General Surgery | Admitting: Dietician

## 2014-09-04 ENCOUNTER — Encounter: Payer: Self-pay | Admitting: Family Medicine

## 2014-09-04 DIAGNOSIS — Z6841 Body Mass Index (BMI) 40.0 and over, adult: Secondary | ICD-10-CM | POA: Insufficient documentation

## 2014-09-04 DIAGNOSIS — Z713 Dietary counseling and surveillance: Secondary | ICD-10-CM | POA: Diagnosis not present

## 2014-09-04 NOTE — Patient Instructions (Addendum)
-  Practice eating slowly and chewing thoroughly -Fix up weight bench -Try using bike for exercise as tolerated -Work on phasing out fried foods

## 2014-09-04 NOTE — Progress Notes (Addendum)
  Medical Nutrition Therapy:  Appt start time: 920 end time:  940.  SWL visit #2:  Primary concerns today:  Brian Newton returns today with his wife for his 2nd SWL visit in preparation for RYGB. Returns with a 6 lb weight loss since last visit. Still working on chewing foods well. Has tried protein shakes. Has a lot of knee pain so unable to exercise much. Not eating too much sugar. Does not drink soda. Has sweet tea with Splenda at home.   Wt Readings from Last 3 Encounters:  09/04/14 337 lb 9.6 oz (153.134 kg)  08/07/14 343 lb 14.4 oz (155.992 kg)  07/04/14 344 lb 9.6 oz (156.31 kg)   Ht Readings from Last 3 Encounters:  09/04/14 5\' 5"  (1.651 m)  08/07/14 5\' 5"  (1.651 m)  07/04/14 5\' 5"  (1.651 m)   Body mass index is 56.18 kg/(m^2). @BMIFA @ Normalized weight-for-age data available only for age 44 to 64 years. Normalized stature-for-age data available only for age 44 to 50 years.   MEDICATIONS: see list  Recent physical activity: nothing outside of working  Estimated energy needs: 1800-2000 calories  Progress Towards Goal(s):  In progress.   Nutritional Diagnosis:  [place]-3.3 Overweight/obesity related to past poor dietary habits and physical inactivity as evidenced by patient w/ pending RYGB surgery following dietary guidelines for continued weight loss.     Intervention:  Nutrition counseling provided. Reviewed pre op goals.  Plan: -Practice eating slowly and chewing thoroughly -Fix up weight bench -Try using bike for exercise as tolerated -Work on phasing out fried foods  Monitoring/Evaluation:  Dietary intake, exercise, and body weight in 4 week(s).

## 2014-09-13 ENCOUNTER — Other Ambulatory Visit: Payer: Self-pay | Admitting: Family Medicine

## 2014-09-13 NOTE — Telephone Encounter (Signed)
Medication refilled per protocol. 

## 2014-09-19 ENCOUNTER — Ambulatory Visit (INDEPENDENT_AMBULATORY_CARE_PROVIDER_SITE_OTHER): Payer: Federal, State, Local not specified - PPO | Admitting: Family Medicine

## 2014-09-19 ENCOUNTER — Other Ambulatory Visit: Payer: Federal, State, Local not specified - PPO

## 2014-09-19 DIAGNOSIS — Z23 Encounter for immunization: Secondary | ICD-10-CM

## 2014-09-19 DIAGNOSIS — E785 Hyperlipidemia, unspecified: Secondary | ICD-10-CM

## 2014-09-19 LAB — LIPID PANEL
Cholesterol: 132 mg/dL (ref 0–200)
HDL: 46 mg/dL (ref >39)
LDL Cholesterol: 74 mg/dL (ref 0–99)
Total CHOL/HDL Ratio: 2.9 Ratio
Triglycerides: 61 mg/dL (ref <150)
VLDL: 12 mg/dL (ref 0–40)

## 2014-09-24 ENCOUNTER — Encounter: Payer: Self-pay | Admitting: Family Medicine

## 2014-09-24 ENCOUNTER — Ambulatory Visit (INDEPENDENT_AMBULATORY_CARE_PROVIDER_SITE_OTHER): Payer: Federal, State, Local not specified - PPO | Admitting: Family Medicine

## 2014-09-24 VITALS — BP 130/74 | HR 82 | Temp 98.4°F | Resp 22 | Ht 65.0 in | Wt 342.0 lb

## 2014-09-24 DIAGNOSIS — E8881 Metabolic syndrome: Secondary | ICD-10-CM

## 2014-09-24 DIAGNOSIS — G43001 Migraine without aura, not intractable, with status migrainosus: Secondary | ICD-10-CM

## 2014-09-24 MED ORDER — KETOROLAC TROMETHAMINE 60 MG/2ML IM SOLN
60.0000 mg | Freq: Once | INTRAMUSCULAR | Status: AC
  Administered 2014-09-24: 60 mg via INTRAMUSCULAR

## 2014-09-24 MED ORDER — PROMETHAZINE HCL 25 MG/ML IJ SOLN
25.0000 mg | Freq: Once | INTRAMUSCULAR | Status: AC
  Administered 2014-09-24: 25 mg via INTRAMUSCULAR

## 2014-09-24 NOTE — Addendum Note (Signed)
Addended by: Shary Decamp B on: 09/24/2014 05:07 PM   Modules accepted: Orders

## 2014-09-24 NOTE — Progress Notes (Signed)
Subjective:    Patient ID: Brian Newton, male    DOB: 10-23-1966, 48 y.o.   MRN: [id number]  HPI Patient is a severe headache now for greater than 72 hours. It is located in both temples. It is pulsatile in nature and associated with photophobia and phonophobia. He also reports nausea. Patient has a past medical history of migraines. This feels like his typical migraine. He denies any neurologic deficits. He denies any vision changes. He also followup for his metabolic syndrome. Since I last saw the patient in May, he has lost 10 pounds through aggressive diet exercise and nutrition. Unfortunately he continues to have issues with obesity. In July his hemoglobin A1c was elevated at 6.2. His cholesterol was acceptable in July. His most recent cholesterol in October was excellent. He is scheduled for gastric bypass in November. Past Medical History  Diagnosis Date  . 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 Headache(784.0)   . Hypercholesteremia   . Hypertension   . Metabolic syndrome   . Arthritis    Past Surgical History  Procedure Laterality Date  . Appendectomy    . Ankle arthroscopy    . Colonoscopy N/A 03/02/2013    Procedure: COLONOSCOPY;  Surgeon: Beryle Beams, MD;  Location: [facility];  Service: Endoscopy;  Laterality: N/A;  . Breath tek h pylori N/A 08/08/2014    Procedure: BREATH TEK H PYLORI;  Surgeon: Edward Jolly, MD;  Location: [facility];  Service: General;  Laterality: N/A;   Current Outpatient Prescriptions on File Prior to Visit  Medication Sig Dispense Refill  . amLODipine-benazepril (LOTREL) 5-20 MG per capsule TAKE (2) CAPSULES BY MOUTH ONCE EVERY MORNING.  60 capsule  2  . aspirin 81 MG tablet Take 81 mg by mouth daily.      . cholecalciferol (VITAMIN D) 1000 UNITS tablet Take 1,000 Units by mouth daily.      . furosemide (LASIX) 20 MG tablet TAKE 1 TABLET BY MOUTH ONCE DAILY AS NEEDED FOR LEG SWELLING.  30 tablet  5  .  glucosamine-chondroitin 500-400 MG tablet Take 1 tablet by mouth every morning.      Marland Kitchen HYDROcodone-acetaminophen (NORCO) 5-325 MG per tablet Take 1 tablet by mouth every 6 (six) hours as needed for moderate pain.  20 tablet  0  . loratadine (CLARITIN) 10 MG tablet Take 10 mg by mouth every morning.      . meloxicam (MOBIC) 15 MG tablet Take 1 tablet (15 mg total) by mouth daily.  30 tablet  5  . metoprolol succinate (TOPROL-XL) 25 MG 24 hr tablet TAKE 1 TABLET BY MOUTH ONCE A DAY.  30 tablet  5  . Multiple Vitamins-Minerals (MULTIVITAMIN WITH MINERALS) tablet Take 1 tablet by mouth daily.      . Omega-3 Fatty Acids (FISH OIL) 1000 MG CAPS Take 1,000 mg by mouth daily.      . phentermine (ADIPEX-P) 37.5 MG tablet Take 1 tablet (37.5 mg total) by mouth daily before breakfast.  30 tablet  2  . pravastatin (PRAVACHOL) 40 MG tablet TAKE (1) TABLET BY MOUTH ONCE DAILY.  30 tablet  2  . SUMAtriptan (IMITREX) 50 MG tablet May repeat in 2 hours if headache persists or recurs.  10 tablet  3  . vitamin C (ASCORBIC ACID) 250 MG tablet Take 250 mg by mouth daily.       No current facility-administered medications on file prior to visit.   Allergies  Allergen Reactions  . Penicillins Rash  History   Social History  . Marital Status: Married    Spouse Name: N/A    Number of Children: N/A  . Years of Education: N/A   Occupational History  . Not on file.   Social History Main Topics  . Smoking status: Never Smoker   . Smokeless tobacco: Never Used  . Alcohol Use: No  . Drug Use: No  . Sexual Activity: Yes   Other Topics Concern  . Not on file   Social History Narrative  . No narrative on file      Review of Systems  All other systems reviewed and are negative.      Objective:   Physical Exam  Vitals reviewed. Constitutional: He is oriented to person, place, and time. He appears well-developed and well-nourished. No distress.  Eyes: Conjunctivae and EOM are normal. Pupils are  equal, round, and reactive to light.  Cardiovascular: Normal rate, regular rhythm and normal heart sounds.   Pulmonary/Chest: Effort normal and breath sounds normal. No respiratory distress. He has no wheezes. He has no rales.  Abdominal: Soft. Bowel sounds are normal. He exhibits no distension and no mass. There is no tenderness. There is no rebound and no guarding.  Neurological: He is alert and oriented to person, place, and time. He has normal reflexes. He displays normal reflexes. No cranial nerve deficit. He exhibits normal muscle tone. Coordination normal.  Skin: He is not diaphoretic.          Assessment & Plan:  Metabolic syndrome  Migraine without aura and with status migrainosus, not intractable  Patient received Toradol 60 mg IM times one and Phenergan 25 mg IM x1. This is typically aborted his migraine headaches in the past. His blood pressures well controlled and his cholesterol is acceptable. I am very excited for the patient he is scheduled for gastric. I hope with substantial weight loss we can help to his metabolic syndrome including his prediabetes, hyperlipidemia, and hypertension.Marland Kitchen

## 2014-09-26 ENCOUNTER — Ambulatory Visit: Payer: Federal, State, Local not specified - PPO | Admitting: Family Medicine

## 2014-10-02 ENCOUNTER — Ambulatory Visit: Payer: Federal, State, Local not specified - PPO | Admitting: Dietician

## 2014-10-03 ENCOUNTER — Encounter: Payer: Federal, State, Local not specified - PPO | Attending: General Surgery | Admitting: Dietician

## 2014-10-03 DIAGNOSIS — Z713 Dietary counseling and surveillance: Secondary | ICD-10-CM | POA: Insufficient documentation

## 2014-10-03 DIAGNOSIS — Z6841 Body Mass Index (BMI) 40.0 and over, adult: Secondary | ICD-10-CM | POA: Insufficient documentation

## 2014-10-03 NOTE — Progress Notes (Signed)
  Medical Nutrition Therapy:  Appt start time: 305 end time:  320  SWL visit #3:  Primary concerns today:  Brian Newton returns today with his wife for his 44rd and final SWL visit in preparation for RYGB. He has lost 3 pounds in the last week. He states he has been taking Adipex for 3 months. He has been trying to be more active at work and baking most foods. Has been working on pre op goals like chewing thoroughly.    Wt Readings from Last 3 Encounters:  09/24/14 342 lb (155.13 kg)  09/04/14 337 lb 9.6 oz (153.134 kg)  08/07/14 343 lb 14.4 oz (155.992 kg)   Ht Readings from Last 3 Encounters:  09/24/14 5\' 5"  (1.651 m)  09/04/14 5\' 5"  (1.651 m)  08/07/14 5\' 5"  (1.651 m)   There is no weight on file to calculate BMI. @BMIFA @ Normalized weight-for-age data available only for age 39 to 49 years. Normalized stature-for-age data available only for age 39 to 41 years.   MEDICATIONS: see list  Recent physical activity: nothing outside of working  Estimated energy needs: 1800-2000 calories  Progress Towards Goal(s):  In progress.   Nutritional Diagnosis:  [place]-3.3 Overweight/obesity related to past poor dietary habits and physical inactivity as evidenced by patient w/ pending RYGB surgery following dietary guidelines for continued weight loss.     Intervention:  Nutrition counseling provided. Reviewed pre op goals.  Plan: -Practice eating slowly and chewing thoroughly -Fix up weight bench -Try using bike for exercise as tolerated -Work on phasing out fried foods  Monitoring/Evaluation:  Dietary intake, exercise, and body weight in 4 week(s).

## 2014-10-15 NOTE — Progress Notes (Signed)
  Pre-Operative Nutrition Class:  Appt start time: 6004   End time:  1830.  Patient was seen on 10/14/14 for Pre-Operative Bariatric Surgery Education at [facility].   Surgery date: 11/05/2014 Surgery type: RYGB Start weight at [facility]: 344.5 on 07/25/14 Weight today: 344.5 lbs  TANITA  BODY COMP RESULTS  No pre op tanita   BMI (kg/m^2)    Fat Mass (lbs)    Fat Free Mass (lbs)    Total Body Water (lbs)    Samples given per MNT protocol. Patient educated on appropriate usage: Unjury protein powder (chocolate) - qty 1 Lot #: [id number] Exp: 09/2015  Premier protein shake (strawberry) - qty 1 Lot #: [id number] Exp: 04/2015  Celebrate Vitamins Multivitamin (grape) - qty 1 Lot #: [id number] Exp: 04/2015  PB2 - qty 1 Lot #: [id number]  Exp: 07/2015  The following the learning objectives were met by the patient during this course:  Identify Pre-Op Dietary Goals and will begin 2 weeks pre-operatively  Identify appropriate sources of fluids and proteins   State protein recommendations and appropriate sources pre and post-operatively  Identify Post-Operative Dietary Goals and will follow for 2 weeks post-operatively  Identify appropriate multivitamin and calcium sources  Describe the need for physical activity post-operatively and will follow MD recommendations  State when to call healthcare provider regarding medication questions or post-operative complications  Handouts given during class include:  Pre-Op Bariatric Surgery Diet Handout  Protein Shake Handout  Post-Op Bariatric Surgery Nutrition Handout  BELT Program Information Flyer  Support Group Information Flyer  [facility] Bariatric Supplements Price List  Follow-Up Plan: Patient will follow-up at [facility] 2 weeks post operatively for diet advancement per MD.

## 2014-10-18 ENCOUNTER — Telehealth: Payer: Self-pay | Admitting: Family Medicine

## 2014-10-18 NOTE — Telephone Encounter (Signed)
Patient is having bariatric surgery and needs an additional letter sent to the insurance company saying he has not been on drugs, and that he has not been on drugs in the past year  [phone]

## 2014-10-21 NOTE — Telephone Encounter (Signed)
PT has came by to give [place] this contact person [facility]) phone number is [phone] Fax number is [phone] and they are needing this information to get to the insurance to okay it so they can officially schedule his apt.

## 2014-10-22 ENCOUNTER — Encounter: Payer: Self-pay | Admitting: Family Medicine

## 2014-10-22 NOTE — Telephone Encounter (Signed)
[facility] - Not sure what he needs as far as he is not on "drugs"!?

## 2014-10-23 NOTE — Telephone Encounter (Signed)
Letter written, signed and faxed on 10/22/14 to the below stated fax number

## 2014-10-23 NOTE — Progress Notes (Signed)
Please put orders in Epic surgery 11-05-14 pre op 10-31-14 Thanks

## 2014-10-25 ENCOUNTER — Other Ambulatory Visit (INDEPENDENT_AMBULATORY_CARE_PROVIDER_SITE_OTHER): Payer: Self-pay | Admitting: General Surgery

## 2014-10-29 ENCOUNTER — Other Ambulatory Visit (INDEPENDENT_AMBULATORY_CARE_PROVIDER_SITE_OTHER): Payer: Self-pay | Admitting: General Surgery

## 2014-10-30 NOTE — Patient Instructions (Addendum)
Brian Newton  10/30/2014                           YOUR PROCEDURE IS SCHEDULED ON:  11/05/14                ENTER FROM [address] - GO TO PARKING DECK               LOOK FOR VALET PARKING  / [employer]                              FOLLOW  SIGNS TO SHORT STAY CENTER                 ARRIVE AT SHORT STAY AT:  10:45 am               CALL THIS NUMBER IF ANY PROBLEMS THE DAY OF SURGERY :               [phone]                                REMEMBER:   Do not eat food or drink liquids AFTER MIDNIGHT              May have water until 6:40 am                  Take these medicines the morning of surgery with               A SIPS OF WATER :    PRAVASTATIN / METOPROLOL               BRING C PAP MASK AND TUBING TO HOSPITAL     Do not wear jewelry, make-up   Do not wear lotions, powders, or perfumes.   Do not shave legs or underarms 12 hrs. before surgery (men may shave face)  Do not bring valuables to the hospital.  Contacts, dentures or bridgework may not be worn into surgery.  Leave suitcase in the car. After surgery it may be brought to your room.  For patients admitted to the hospital more than one night, checkout time is            11:00 AM                                                        ________________________________________________________________________                                                                                                  [place]  Before surgery, you can play an important role.  Because skin is not sterile, your skin needs to be as free of germs as possible.  You  can reduce the number of germs on your skin by washing with CHG (chlorahexidine gluconate) soap before surgery.  CHG is an antiseptic cleaner which kills germs and bonds with the skin to continue killing germs even after washing. Please DO NOT use if you have an allergy to CHG or antibacterial soaps.  If your skin becomes  reddened/irritated stop using the CHG and inform your nurse when you arrive at Short Stay. Do not shave (including legs and underarms) for at least 48 hours prior to the first CHG shower.  You may shave your face. Please follow these instructions carefully:   1.  Shower with CHG Soap the night before surgery and the  morning of Surgery.   2.  If you choose to wash your hair, wash your hair first as usual with your  normal  Shampoo.   3.  After you shampoo, rinse your hair and body thoroughly to remove the  shampoo.                                         4.  Use CHG as you would any other liquid soap.  You can apply chg directly  to the skin and wash . Gently wash with scrungie or clean wascloth    5.  Apply the CHG Soap to your body ONLY FROM THE NECK DOWN.   Do not use on open                           Wound or open sores. Avoid contact with eyes, ears mouth and genitals (private parts).                        Genitals (private parts) with your normal soap.              6.  Wash thoroughly, paying special attention to the area where your surgery  will be performed.   7.  Thoroughly rinse your body with warm water from the neck down.   8.  DO NOT shower/wash with your normal soap after using and rinsing off  the CHG Soap .                9.  Pat yourself dry with a clean towel.             10.  Wear clean pajamas.             11.  Place clean sheets on your bed the night of your first shower and do not  sleep with pets.  Day of Surgery : Do not apply any lotions/deodorants the morning of surgery.  Please wear clean clothes to the hospital/surgery center.  FAILURE TO FOLLOW THESE INSTRUCTIONS MAY RESULT IN THE CANCELLATION OF YOUR SURGERY    PATIENT SIGNATURE_________________________________  ______________________________________________________________________

## 2014-10-31 ENCOUNTER — Ambulatory Visit: Payer: Federal, State, Local not specified - PPO | Admitting: Family Medicine

## 2014-10-31 ENCOUNTER — Encounter (HOSPITAL_COMMUNITY)
Admission: RE | Admit: 2014-10-31 | Discharge: 2014-10-31 | Disposition: A | Discharge status: Home / Self Care | Payer: Federal, State, Local not specified - PPO | Source: Ambulatory Visit | Attending: General Surgery | Admitting: General Surgery

## 2014-10-31 ENCOUNTER — Encounter (HOSPITAL_COMMUNITY): Payer: Self-pay

## 2014-10-31 DIAGNOSIS — Z01812 Encounter for preprocedural laboratory examination: Secondary | ICD-10-CM | POA: Insufficient documentation

## 2014-10-31 HISTORY — DX: Sleep apnea, unspecified: G47.30

## 2014-10-31 HISTORY — DX: Edema, unspecified: R60.9

## 2014-10-31 HISTORY — DX: Pain in unspecified wrist: M25.539

## 2014-10-31 HISTORY — DX: Hyperglycemia, unspecified: R73.9

## 2014-10-31 LAB — COMPREHENSIVE METABOLIC PANEL
ALT: 36 U/L (ref 0–53)
AST: 34 U/L (ref 0–37)
Albumin: 4.1 g/dL (ref 3.5–5.2)
Alkaline Phosphatase: 71 U/L (ref 39–117)
Anion gap: 11 (ref 5–15)
BILIRUBIN TOTAL: 0.5 mg/dL (ref 0.3–1.2)
BUN: 25 mg/dL — ABNORMAL HIGH (ref 6–23)
CHLORIDE: 101 meq/L (ref 96–112)
CO2: 28 meq/L (ref 19–32)
CREATININE: 0.97 mg/dL (ref 0.50–1.35)
Calcium: 9.5 mg/dL (ref 8.4–10.5)
GLUCOSE: 90 mg/dL (ref 70–99)
Potassium: 4.4 mEq/L (ref 3.7–5.3)
Sodium: 140 mEq/L (ref 137–147)
Total Protein: 7.4 g/dL (ref 6.0–8.3)

## 2014-10-31 LAB — CBC WITH DIFFERENTIAL/PLATELET
Basophils Absolute: 0 10*3/uL (ref 0.0–0.1)
Basophils Relative: 0 % (ref 0–1)
Eosinophils Absolute: 0.3 10*3/uL (ref 0.0–0.7)
Eosinophils Relative: 3 % (ref 0–5)
HEMATOCRIT: 45.6 % (ref 39.0–52.0)
HEMOGLOBIN: 14.7 g/dL (ref 13.0–17.0)
LYMPHS ABS: 2.6 10*3/uL (ref 0.7–4.0)
LYMPHS PCT: 33 % (ref 12–46)
MCH: 29 pg (ref 26.0–34.0)
MCHC: 32.2 g/dL (ref 30.0–36.0)
MCV: 89.9 fL (ref 78.0–100.0)
MONO ABS: 1 10*3/uL (ref 0.1–1.0)
Monocytes Relative: 13 % — ABNORMAL HIGH (ref 3–12)
Neutro Abs: 4 10*3/uL (ref 1.7–7.7)
Neutrophils Relative %: 51 % (ref 43–77)
Platelets: 289 10*3/uL (ref 150–400)
RBC: 5.07 MIL/uL (ref 4.22–5.81)
RDW: 13.9 % (ref 11.5–15.5)
WBC: 7.9 10*3/uL (ref 4.0–10.5)

## 2014-10-31 NOTE — Progress Notes (Signed)
cmet results routed to dr Excell Seltzer inbasket by epic

## 2014-10-31 NOTE — Progress Notes (Signed)
[employer] notified of beri bed order

## 2014-11-05 ENCOUNTER — Encounter (HOSPITAL_COMMUNITY)
Admission: RE | Disposition: A | Discharge status: Home / Self Care | Payer: Self-pay | Source: Ambulatory Visit | Attending: General Surgery

## 2014-11-05 ENCOUNTER — Inpatient Hospital Stay (HOSPITAL_COMMUNITY): Payer: Federal, State, Local not specified - PPO | Admitting: Anesthesiology

## 2014-11-05 ENCOUNTER — Encounter (HOSPITAL_COMMUNITY): Payer: Self-pay | Admitting: *Deleted

## 2014-11-05 ENCOUNTER — Inpatient Hospital Stay (HOSPITAL_COMMUNITY)
Admission: RE | Admit: 2014-11-05 | Discharge: 2014-11-07 | DRG: 621 | Disposition: A | Discharge status: Home / Self Care | Payer: Federal, State, Local not specified - PPO | Source: Ambulatory Visit | Attending: General Surgery | Admitting: General Surgery

## 2014-11-05 DIAGNOSIS — G8929 Other chronic pain: Secondary | ICD-10-CM | POA: Diagnosis present

## 2014-11-05 DIAGNOSIS — E780 Pure hypercholesterolemia: Secondary | ICD-10-CM | POA: Diagnosis present

## 2014-11-05 DIAGNOSIS — Z7982 Long term (current) use of aspirin: Secondary | ICD-10-CM | POA: Diagnosis not present

## 2014-11-05 DIAGNOSIS — Z01812 Encounter for preprocedural laboratory examination: Secondary | ICD-10-CM

## 2014-11-05 DIAGNOSIS — Z79899 Other long term (current) drug therapy: Secondary | ICD-10-CM

## 2014-11-05 DIAGNOSIS — E8881 Metabolic syndrome: Secondary | ICD-10-CM | POA: Diagnosis present

## 2014-11-05 DIAGNOSIS — G4733 Obstructive sleep apnea (adult) (pediatric): Secondary | ICD-10-CM | POA: Diagnosis present

## 2014-11-05 DIAGNOSIS — I10 Essential (primary) hypertension: Secondary | ICD-10-CM | POA: Diagnosis present

## 2014-11-05 DIAGNOSIS — Z6841 Body Mass Index (BMI) 40.0 and over, adult: Secondary | ICD-10-CM | POA: Diagnosis not present

## 2014-11-05 DIAGNOSIS — Z9884 Bariatric surgery status: Secondary | ICD-10-CM

## 2014-11-05 DIAGNOSIS — M1990 Unspecified osteoarthritis, unspecified site: Secondary | ICD-10-CM | POA: Diagnosis present

## 2014-11-05 HISTORY — PX: GASTRIC ROUX-EN-Y: SHX5262

## 2014-11-05 HISTORY — PX: UPPER GI ENDOSCOPY: SHX6162

## 2014-11-05 LAB — HEMOGLOBIN AND HEMATOCRIT, BLOOD
HEMATOCRIT: 44.5 % (ref 39.0–52.0)
HEMOGLOBIN: 14.8 g/dL (ref 13.0–17.0)

## 2014-11-05 SURGERY — LAPAROSCOPIC ROUX-EN-Y GASTRIC BYPASS WITH UPPER ENDOSCOPY
Anesthesia: General

## 2014-11-05 MED ORDER — MIDAZOLAM HCL 5 MG/5ML IJ SOLN
INTRAMUSCULAR | Status: DC | PRN
Start: 2014-11-05 — End: 2014-11-05
  Administered 2014-11-05: 2 mg via INTRAVENOUS

## 2014-11-05 MED ORDER — METOCLOPRAMIDE HCL 5 MG/ML IJ SOLN
INTRAMUSCULAR | Status: AC
  Filled 2014-11-05: qty 2

## 2014-11-05 MED ORDER — GLYCOPYRROLATE 0.2 MG/ML IJ SOLN
INTRAMUSCULAR | Status: AC
  Filled 2014-11-05: qty 4

## 2014-11-05 MED ORDER — UNJURY VANILLA POWDER
2.0000 [oz_av] | Freq: Four times a day (QID) | ORAL | Status: DC
  Administered 2014-11-07: 2 [oz_av] via ORAL

## 2014-11-05 MED ORDER — LACTATED RINGERS IV SOLN
INTRAVENOUS | Status: DC | PRN
  Administered 2014-11-05 (×4): via INTRAVENOUS

## 2014-11-05 MED ORDER — NEOSTIGMINE METHYLSULFATE 10 MG/10ML IV SOLN
INTRAVENOUS | Status: DC | PRN
  Administered 2014-11-05: 4 mg via INTRAVENOUS

## 2014-11-05 MED ORDER — CETYLPYRIDINIUM CHLORIDE 0.05 % MT LIQD
7.0000 mL | Freq: Two times a day (BID) | OROMUCOSAL | Status: DC
  Administered 2014-11-06 (×2): 7 mL via OROMUCOSAL

## 2014-11-05 MED ORDER — MORPHINE SULFATE 2 MG/ML IJ SOLN
2.0000 mg | INTRAMUSCULAR | Status: DC | PRN
  Administered 2014-11-05 (×2): 4 mg via INTRAVENOUS
  Administered 2014-11-05: 2 mg via INTRAVENOUS
  Administered 2014-11-06 (×2): 4 mg via INTRAVENOUS
  Administered 2014-11-06: 2 mg via INTRAVENOUS
  Filled 2014-11-05: qty 1
  Filled 2014-11-05 (×2): qty 2
  Filled 2014-11-05 (×2): qty 1
  Filled 2014-11-05: qty 2
  Filled 2014-11-05: qty 1

## 2014-11-05 MED ORDER — CHLORHEXIDINE GLUCONATE CLOTH 2 % EX PADS: 6.0000 | MEDICATED_PAD | Freq: Once | CUTANEOUS | Status: DC

## 2014-11-05 MED ORDER — POTASSIUM CHLORIDE IN NACL 20-0.9 MEQ/L-% IV SOLN
INTRAVENOUS | Status: DC
  Administered 2014-11-05: 100 mL/h via INTRAVENOUS
  Administered 2014-11-06: 1000 mL via INTRAVENOUS
  Administered 2014-11-07: 04:00:00 via INTRAVENOUS
  Filled 2014-11-05 (×6): qty 1000

## 2014-11-05 MED ORDER — AMLODIPINE BESYLATE 5 MG PO TABS
5.0000 mg | ORAL_TABLET | Freq: Once | ORAL | Status: AC
  Administered 2014-11-05: 5 mg via ORAL
  Filled 2014-11-05: qty 1

## 2014-11-05 MED ORDER — UNJURY CHOCOLATE CLASSIC POWDER: 2.0000 [oz_av] | Freq: Four times a day (QID) | ORAL | Status: DC

## 2014-11-05 MED ORDER — BUPIVACAINE-EPINEPHRINE 0.25% -1:200000 IJ SOLN
INTRAMUSCULAR | Status: DC | PRN
Start: 2014-11-05 — End: 2014-11-05
  Administered 2014-11-05: 30 mL

## 2014-11-05 MED ORDER — LACTATED RINGERS IR SOLN
Status: DC | PRN
  Administered 2014-11-05: 1000 mL

## 2014-11-05 MED ORDER — HYDROMORPHONE HCL 2 MG/ML IJ SOLN
INTRAMUSCULAR | Status: AC
  Filled 2014-11-05: qty 1

## 2014-11-05 MED ORDER — ACETAMINOPHEN 160 MG/5ML PO SOLN: 650.0000 mg | ORAL | Status: DC | PRN

## 2014-11-05 MED ORDER — FENTANYL CITRATE 0.05 MG/ML IJ SOLN
INTRAMUSCULAR | Status: AC
  Filled 2014-11-05: qty 5

## 2014-11-05 MED ORDER — HYDROMORPHONE HCL 1 MG/ML IJ SOLN: 0.2500 mg | INTRAMUSCULAR | Status: DC | PRN

## 2014-11-05 MED ORDER — PROPOFOL 10 MG/ML IV BOLUS
INTRAVENOUS | Status: DC | PRN
  Administered 2014-11-05: 200 mg via INTRAVENOUS

## 2014-11-05 MED ORDER — ACETAMINOPHEN 160 MG/5ML PO SOLN
325.0000 mg | ORAL | Status: DC | PRN
  Administered 2014-11-06: 650 mg via ORAL
  Filled 2014-11-05: qty 20.3

## 2014-11-05 MED ORDER — FENTANYL CITRATE 0.05 MG/ML IJ SOLN
INTRAMUSCULAR | Status: DC | PRN
  Administered 2014-11-05 (×3): 50 ug via INTRAVENOUS
  Administered 2014-11-05: 100 ug via INTRAVENOUS

## 2014-11-05 MED ORDER — METOCLOPRAMIDE HCL 5 MG/ML IJ SOLN
INTRAMUSCULAR | Status: DC | PRN
  Administered 2014-11-05: 10 mg via INTRAVENOUS

## 2014-11-05 MED ORDER — CISATRACURIUM BESYLATE 20 MG/10ML IV SOLN
INTRAVENOUS | Status: AC
  Filled 2014-11-05: qty 10

## 2014-11-05 MED ORDER — UNJURY CHICKEN SOUP POWDER: 2.0000 [oz_av] | Freq: Four times a day (QID) | ORAL | Status: DC

## 2014-11-05 MED ORDER — TISSEEL VH 10 ML EX KIT
PACK | CUTANEOUS | Status: AC
  Filled 2014-11-05: qty 2

## 2014-11-05 MED ORDER — HYDROMORPHONE HCL 1 MG/ML IJ SOLN
INTRAMUSCULAR | Status: DC | PRN
  Administered 2014-11-05: 1 mg via INTRAVENOUS

## 2014-11-05 MED ORDER — LACTATED RINGERS IV SOLN: INTRAVENOUS | Status: DC

## 2014-11-05 MED ORDER — ONDANSETRON HCL 4 MG/2ML IJ SOLN
INTRAMUSCULAR | Status: DC | PRN
  Administered 2014-11-05: 4 mg via INTRAVENOUS

## 2014-11-05 MED ORDER — 0.9 % SODIUM CHLORIDE (POUR BTL) OPTIME
TOPICAL | Status: DC | PRN
  Administered 2014-11-05: 1000 mL

## 2014-11-05 MED ORDER — GLYCOPYRROLATE 0.2 MG/ML IJ SOLN
INTRAMUSCULAR | Status: DC | PRN
  Administered 2014-11-05: .8 mg via INTRAVENOUS

## 2014-11-05 MED ORDER — SUCCINYLCHOLINE CHLORIDE 20 MG/ML IJ SOLN
INTRAMUSCULAR | Status: DC | PRN
  Administered 2014-11-05: 100 mg via INTRAVENOUS

## 2014-11-05 MED ORDER — CHLORHEXIDINE GLUCONATE 0.12 % MT SOLN
15.0000 mL | Freq: Two times a day (BID) | OROMUCOSAL | Status: DC
  Administered 2014-11-05 – 2014-11-07 (×3): 15 mL via OROMUCOSAL
  Filled 2014-11-05 (×6): qty 15

## 2014-11-05 MED ORDER — MIDAZOLAM HCL 2 MG/2ML IJ SOLN
INTRAMUSCULAR | Status: AC
  Filled 2014-11-05: qty 2

## 2014-11-05 MED ORDER — HEPARIN SODIUM (PORCINE) 5000 UNIT/ML IJ SOLN
5000.0000 [IU] | Freq: Three times a day (TID) | INTRAMUSCULAR | Status: DC
  Administered 2014-11-05 – 2014-11-07 (×5): 5000 [IU] via SUBCUTANEOUS
  Filled 2014-11-05 (×8): qty 1

## 2014-11-05 MED ORDER — METOPROLOL TARTRATE 1 MG/ML IV SOLN
5.0000 mg | Freq: Four times a day (QID) | INTRAVENOUS | Status: DC
  Administered 2014-11-06 – 2014-11-07 (×6): 5 mg via INTRAVENOUS
  Filled 2014-11-05 (×11): qty 5

## 2014-11-05 MED ORDER — PROPOFOL 10 MG/ML IV BOLUS
INTRAVENOUS | Status: AC
  Filled 2014-11-05: qty 20

## 2014-11-05 MED ORDER — ONDANSETRON HCL 4 MG/2ML IJ SOLN: 4.0000 mg | INTRAMUSCULAR | Status: DC | PRN

## 2014-11-05 MED ORDER — ONDANSETRON HCL 4 MG/2ML IJ SOLN
INTRAMUSCULAR | Status: AC
  Filled 2014-11-05: qty 2

## 2014-11-05 MED ORDER — TISSEEL VH 10 ML EX KIT
PACK | CUTANEOUS | Status: DC | PRN
  Administered 2014-11-05: 1

## 2014-11-05 MED ORDER — LACTATED RINGERS IV SOLN
INTRAVENOUS | Status: DC
  Administered 2014-11-05: 1000 mL via INTRAVENOUS

## 2014-11-05 MED ORDER — LEVOFLOXACIN IN D5W 750 MG/150ML IV SOLN
750.0000 mg | INTRAVENOUS | Status: AC
  Administered 2014-11-05: 750 mg via INTRAVENOUS
  Filled 2014-11-05: qty 150

## 2014-11-05 MED ORDER — OXYCODONE HCL 5 MG/5ML PO SOLN
5.0000 mg | ORAL | Status: DC | PRN
  Administered 2014-11-06 – 2014-11-07 (×4): 5 mg via ORAL
  Filled 2014-11-05 (×4): qty 5

## 2014-11-05 MED ORDER — BUPIVACAINE-EPINEPHRINE 0.25% -1:200000 IJ SOLN
INTRAMUSCULAR | Status: AC
  Filled 2014-11-05: qty 1

## 2014-11-05 MED ORDER — CISATRACURIUM BESYLATE (PF) 10 MG/5ML IV SOLN
INTRAVENOUS | Status: DC | PRN
  Administered 2014-11-05 (×2): 4 mg via INTRAVENOUS
  Administered 2014-11-05: 8 mg via INTRAVENOUS
  Administered 2014-11-05: 4 mg via INTRAVENOUS

## 2014-11-05 MED ORDER — DEXAMETHASONE SODIUM PHOSPHATE 10 MG/ML IJ SOLN
INTRAMUSCULAR | Status: AC
  Filled 2014-11-05: qty 1

## 2014-11-05 MED ORDER — PHENYLEPHRINE HCL 10 MG/ML IJ SOLN
INTRAMUSCULAR | Status: DC | PRN
  Administered 2014-11-05 (×2): 80 ug via INTRAVENOUS
  Administered 2014-11-05: 160 ug via INTRAVENOUS

## 2014-11-05 MED ORDER — DEXAMETHASONE SODIUM PHOSPHATE 10 MG/ML IJ SOLN
INTRAMUSCULAR | Status: DC | PRN
  Administered 2014-11-05: 10 mg via INTRAVENOUS

## 2014-11-05 SURGICAL SUPPLY — 85 items
ADH SKN CLS APL DERMABOND .7 (GAUZE/BANDAGES/DRESSINGS) ×2
APL SRG 32X5 SNPLK LF DISP (MISCELLANEOUS) ×2
APPLICATOR COTTON TIP 6IN STRL (MISCELLANEOUS) ×6 IMPLANT
APPLIER CLIP ROT 13.4 12 LRG (CLIP)
APR CLP LRG 13.4X12 ROT 20 MLT (CLIP)
BAG SPEC RTRVL LRG 6X4 10 (ENDOMECHANICALS)
BLADE SURG SZ11 CARB STEEL (BLADE) ×3 IMPLANT
CABLE HIGH FREQUENCY MONO STRZ (ELECTRODE) ×3 IMPLANT
CANISTER SUCTION 2500CC (MISCELLANEOUS) ×3 IMPLANT
CHLORAPREP W/TINT 26ML (MISCELLANEOUS) ×3 IMPLANT
CLIP APPLIE ROT 13.4 12 LRG (CLIP) IMPLANT
CLIP SUT LAPRA TY ABSORB (SUTURE) ×6 IMPLANT
CUTTER FLEX LINEAR 45M (STAPLE) ×3 IMPLANT
DECANTER SPIKE VIAL GLASS SM (MISCELLANEOUS) ×3 IMPLANT
DERMABOND ADVANCED (GAUZE/BANDAGES/DRESSINGS) ×1
DERMABOND ADVANCED .7 DNX12 (GAUZE/BANDAGES/DRESSINGS) ×1 IMPLANT
DEVICE SUTURE ENDOST 10MM (ENDOMECHANICALS) IMPLANT
DISSECTOR BLUNT TIP ENDO 5MM (MISCELLANEOUS) IMPLANT
DRAIN PENROSE 18X1/4 LTX STRL (WOUND CARE) ×3 IMPLANT
DRAPE CAMERA CLOSED 9X96 (DRAPES) ×3 IMPLANT
ELECT REM PT RETURN 9FT ADLT (ELECTROSURGICAL) ×3
ELECTRODE REM PT RTRN 9FT ADLT (ELECTROSURGICAL) ×2 IMPLANT
GAUZE SPONGE 4X4 12PLY STRL (GAUZE/BANDAGES/DRESSINGS) ×3 IMPLANT
GAUZE SPONGE 4X4 16PLY XRAY LF (GAUZE/BANDAGES/DRESSINGS) ×3 IMPLANT
GLOVE BIOGEL PI IND STRL 7.5 (GLOVE) ×2 IMPLANT
GLOVE BIOGEL PI INDICATOR 7.5 (GLOVE) ×1
GLOVE SS BIOGEL STRL SZ 7.5 (GLOVE) ×2 IMPLANT
GLOVE SUPERSENSE BIOGEL SZ 7.5 (GLOVE) ×1
GOWN STRL REUS W/TWL XL LVL3 (GOWN DISPOSABLE) ×6 IMPLANT
HEMOSTAT SURGICEL 4X8 (HEMOSTASIS) IMPLANT
HOVERMATT SINGLE USE (MISCELLANEOUS) ×3 IMPLANT
KIT BASIN OR (CUSTOM PROCEDURE TRAY) ×3 IMPLANT
KIT GASTRIC LAVAGE 34FR ADT (SET/KITS/TRAYS/PACK) ×3 IMPLANT
LIQUID BAND (GAUZE/BANDAGES/DRESSINGS) ×3 IMPLANT
NDL SPNL 22GX3.5 QUINCKE BK (NEEDLE) ×2 IMPLANT
NEEDLE SPNL 22GX3.5 QUINCKE BK (NEEDLE) ×3 IMPLANT
PACK CARDIOVASCULAR III (CUSTOM PROCEDURE TRAY) ×3 IMPLANT
PEN SKIN MARKING BROAD (MISCELLANEOUS) ×3 IMPLANT
POUCH SPECIMEN RETRIEVAL 10MM (ENDOMECHANICALS) IMPLANT
RELOAD 45 VASCULAR/THIN (ENDOMECHANICALS) ×3 IMPLANT
RELOAD ENDO STITCH 2.0 (ENDOMECHANICALS)
RELOAD STAPLE 45 2.5 WHT GRN (ENDOMECHANICALS) ×2 IMPLANT
RELOAD STAPLE 45 3.5 BLU ETS (ENDOMECHANICALS) ×1 IMPLANT
RELOAD STAPLE 60 2.6 WHT THN (STAPLE) ×1 IMPLANT
RELOAD STAPLE 60 3.6 BLU REG (STAPLE) ×4 IMPLANT
RELOAD STAPLE 60 3.8 GOLD REG (STAPLE) ×2 IMPLANT
RELOAD STAPLE TA45 3.5 REG BLU (ENDOMECHANICALS) ×6 IMPLANT
RELOAD STAPLER BLUE 60MM (STAPLE) ×8 IMPLANT
RELOAD STAPLER GOLD 60MM (STAPLE) ×2 IMPLANT
RELOAD STAPLER WHITE 60MM (STAPLE) ×2 IMPLANT
RELOAD SUT SNGL STCH ABSRB 2-0 (ENDOMECHANICALS) IMPLANT
RELOAD SUT SNGL STCH BLK 2-0 (ENDOMECHANICALS) IMPLANT
SCISSORS LAP 5X45 EPIX DISP (ENDOMECHANICALS) ×3 IMPLANT
SEALANT SURGICAL APPL DUAL CAN (MISCELLANEOUS) ×3 IMPLANT
SET IRRIG TUBING LAPAROSCOPIC (IRRIGATION / IRRIGATOR) ×3 IMPLANT
SHEARS CURVED HARMONIC AC 45CM (MISCELLANEOUS) ×3 IMPLANT
SLEEVE ADV FIXATION 12X100MM (TROCAR) ×6 IMPLANT
SOLUTION ANTI FOG 6CC (MISCELLANEOUS) ×3 IMPLANT
STAPLE ECHEON FLEX 60 POW ENDO (STAPLE) IMPLANT
STAPLER ECHELON LONG 60 440 (INSTRUMENTS) IMPLANT
STAPLER RELOAD BLUE 60MM (STAPLE) ×12
STAPLER RELOAD GOLD 60MM (STAPLE) ×3
STAPLER RELOAD WHITE 60MM (STAPLE) ×3
STAPLER VISISTAT 35W (STAPLE) ×3 IMPLANT
SUT DEVICE BRAIDED 0X39 (SUTURE) IMPLANT
SUT DVC SILK 2.0X39 (SUTURE) ×15 IMPLANT
SUT DVC VICRYL PGA 2.0X39 (SUTURE) ×15 IMPLANT
SUT MNCRL AB 4-0 PS2 18 (SUTURE) ×3 IMPLANT
SUT RELOAD ENDO STITCH 2 48X1 (ENDOMECHANICALS)
SUT RELOAD ENDO STITCH 2.0 (ENDOMECHANICALS)
SUT VIC AB 2-0 SH 27 (SUTURE)
SUT VIC AB 2-0 SH 27X BRD (SUTURE) IMPLANT
SUTURE RELOAD END STTCH 2 48X1 (ENDOMECHANICALS) IMPLANT
SUTURE RELOAD ENDO STITCH 2.0 (ENDOMECHANICALS) IMPLANT
SYR 20CC LL (SYRINGE) ×6 IMPLANT
SYR 50ML LL SCALE MARK (SYRINGE) ×3 IMPLANT
TOWEL OR 17X26 10 PK STRL BLUE (TOWEL DISPOSABLE) ×3 IMPLANT
TOWEL OR NON WOVEN STRL DISP B (DISPOSABLE) ×3 IMPLANT
TRAY FOLEY CATH 14FRSI W/METER (CATHETERS) ×3 IMPLANT
TROCAR ADV FIXATION 12X100MM (TROCAR) ×3 IMPLANT
TROCAR ADV FIXATION 5X100MM (TROCAR) ×3 IMPLANT
TROCAR BLADELESS OPT 5 100 (ENDOMECHANICALS) ×3 IMPLANT
TROCAR XCEL 12X100 BLDLESS (ENDOMECHANICALS) ×3 IMPLANT
TUBING ENDO SMARTCAP PENTAX (MISCELLANEOUS) ×3 IMPLANT
TUBING FILTER THERMOFLATOR (ELECTROSURGICAL) ×3 IMPLANT

## 2014-11-05 NOTE — Plan of Care (Signed)
Problem: Consults Goal: Gastric Bypass Patient Education See Patient Education Module for education specifics. Outcome: Completed/Met Date Met:  11/05/14 Goal: Skin Care Protocol Initiated - if Braden Score 18 or less If consults are not indicated, leave blank or document N/A Outcome: Not Applicable Date Met:  89/37/37 Goal: Nutrition Consult-if indicated Outcome: Completed/Met Date Met:  11/05/14 Goal: Diabetes Guidelines if Diabetic/Glucose > 140 If diabetic or lab glucose is > 140 mg/dl - Initiate Diabetes/Hyperglycemia Guidelines & Document Interventions  Outcome: Not Applicable Date Met:  49/66/46

## 2014-11-05 NOTE — Progress Notes (Signed)
RT placed patient on CPAP. Patient setting is auto titrate (min 6 and max 20). Sterile water added to water chamber for humidification. 2 liters oxygen bleed into tubing. Patient is tolerating well. RT will continue to monitor.

## 2014-11-05 NOTE — H&P (Signed)
History of Present Illness Brian Kitchen T. Malillany Kazlauskas MD; 10/25/2014 12:06 PM) Patient words: bariatric.  The patient is a 48 year old male who presents with obesity. -patient returns for His preop visit prior to planned Roux-en-Y gastric bypass for marked morbid obesity and multiple comorbidities including degenerative joint disease, obstructive sleep apnea and metabolic syndrome. He has successfully completed his preoperative workup. No concerns on psych her nutrition evaluation. Ultrasound was unremarkable. H. pylori negative. Lab work unremarkable.   Other Problems Davy Pique Bynum, CMA; 10/25/2014 11:48 AM) Back Pain Hemorrhoids High blood pressure Hypercholesterolemia Migraine Headache Sleep Apnea  Past Surgical History Marjean Donna, CMA; 10/25/2014 11:48 AM) Appendectomy Colon Polyp Removal - Colonoscopy Knee Surgery Right. Oral Surgery  Diagnostic Studies History ([place]; 10/25/2014 11:48 AM) Colonoscopy 1-5 years ago  Allergies Davy Pique Bynum, CMA; 10/25/2014 11:48 AM) Penicillamine *ASSORTED CLASSES*  Medication History (Sonya Bynum, CMA; 10/25/2014 11:55 AM) Amlodipine Besy-Benazepril HCl (5-20MG  Capsule, Oral) Active. Aspirin EC (81MG  Tablet DR, Oral daily) Active. Lasix (20MG  Tablet, Oral) Active. Claritin (10MG  Capsule, Oral) Active. Meloxicam (15MG  Tablet, Oral) Active. Metoprolol Succinate ER (25MG  Tablet ER 24HR, Oral) Active. Pravastatin Sodium (40MG  Tablet, Oral) Active. Imitrex (50MG  Tablet, Oral as needed) Active. Vitamin C (250MG  Tablet Chewable, Oral) Active. Glucosamine Chondroitin Complx (Oral) Active.  Social History Marjean Donna, CMA; 10/25/2014 11:48 AM) Caffeine use Carbonated beverages, Tea. No alcohol use No drug use Tobacco use Never smoker.  Family History Marjean Donna, CMA; 10/25/2014 11:48 AM) Heart disease in male family member before age 48 Hypertension Daughter, Mother. Migraine Headache Daughter,  Mother.  Review of Systems ([place]; 10/25/2014 11:48 AM) General Present- Weight Loss. Not Present- Appetite Loss, Chills, Fatigue, Fever, Night Sweats and Weight Gain. Skin Not Present- Change in Wart/Mole, Dryness, Hives, Jaundice, New Lesions, Non-Healing Wounds, Rash and Ulcer. HEENT Present- Wears glasses/contact lenses. Not Present- Earache, Hearing Loss, Hoarseness, Nose Bleed, Oral Ulcers, Ringing in the Ears, Seasonal Allergies, Sinus Pain, Sore Throat, Visual Disturbances and Yellow Eyes. Respiratory Not Present- Bloody sputum, Chronic Cough, Difficulty Breathing, Snoring and Wheezing. Breast Not Present- Breast Mass, Breast Pain, Nipple Discharge and Skin Changes. Cardiovascular Not Present- Chest Pain, Difficulty Breathing Lying Down, Leg Cramps, Palpitations, Rapid Heart Rate, Shortness of Breath and Swelling of Extremities. Gastrointestinal Not Present- Abdominal Pain, Bloating, Bloody Stool, Change in Bowel Habits, Chronic diarrhea, Constipation, Difficulty Swallowing, Excessive gas, Gets full quickly at meals, Hemorrhoids, Indigestion, Nausea, Rectal Pain and Vomiting. Male Genitourinary Not Present- Blood in Urine, Change in Urinary Stream, Frequency, Impotence, Nocturia, Painful Urination, Urgency and Urine Leakage. Musculoskeletal Not Present- Back Pain, Joint Pain, Joint Stiffness, Muscle Pain, Muscle Weakness and Swelling of Extremities. Neurological Not Present- Decreased Memory, Fainting, Headaches, Numbness, Seizures, Tingling, Tremor, Trouble walking and Weakness. Psychiatric Not Present- Anxiety, Bipolar, Change in Sleep Pattern, Depression, Fearful and Frequent crying. Endocrine Not Present- Cold Intolerance, Excessive Hunger, Hair Changes, Heat Intolerance, Hot flashes and New Diabetes. Hematology Not Present- Easy Bruising, Excessive bleeding, Gland problems, HIV and Persistent Infections.   Vitals (Sonya Bynum CMA; 10/25/2014 11:50 AM) 10/25/2014 11:49  AM Weight: 336 lb Height: 65in Body Surface Area: 2.64 m Body Mass Index: 55.91 kg/m Temp.: 54F(Temporal)  Pulse: 79 (Regular)  BP: 136/78 (Sitting, Left Arm, Standard)    Physical Exam Brian Kitchen T. Sammi Stolarz MD; 10/25/2014 12:07 PM) The physical exam findings are as follows: Note:General: Alert, Morbidly obese Caucasian male, in no distress Skin: Warm and dry without rash or infection. HEENT: No palpable masses or thyromegaly. Sclera nonicteric. Pupils equal  round and reactive. Oropharynx clear. Lymph nodes: No cervical, supraclavicular, or inguinal nodes palpable. Lungs: Breath sounds clear and equal. No wheezing or increased work of breathing. Cardiovascular: Regular rate and rhythm without murmer. No JVD or edema. Peripheral pulses intact. No carotid bruits. Abdomen: Nondistended. Soft and nontender. No masses palpable. No organomegaly. No palpable hernias. Extremities: 1+ edema or joint swelling or deformity. mild chronic venous stasis changes. Neurologic: Alert and fully oriented. Gait normal. No focal weakness. Psychiatric: Normal mood and affect. Thought content appropriate with normal judgement and insight    Assessment & Plan Brian Kitchen T. Shawntae Lowy MD; 10/25/2014 12:10 PM) MORBID OBESITY (278.01  E66.01) Impression: Ready to proceed with planned laparoscopic Roux-en-Y gastric bypass. We reviewed the consent form. All his questions were answered. He n medication and bowel prep. his postoperative pain medication and bowel prep. Current Plans  Started OxyCODONE HCl 5MG /5ML, 5-10 Milliliter every four hours, as needed, 200 Milliliter, 10/25/2014, No Refill.

## 2014-11-05 NOTE — Op Note (Signed)
Name:  Brian Newton MRN: [id number] Date of Surgery: 11/05/2014  Preop Diagnosis:  Morbid Obesity, S/P RYGB  Postop Diagnosis:  Morbid Obesity (Weight - 336, BMI - 55.9), S/P RYGB  Procedure:  Upper endoscopy  (Intraoperative)  Surgeon:  Alphonsa Overall, M.D.  Anesthesia:  GET  Indications for procedure: Brian Newton is a 48 y.o. male whose primary care physician is [facility] TOM, MD and has completed a Roux-en-Y gastric bypass today by Dr. Excell Seltzer.  I am doing an intraoperative upper endoscopy to evaluate the gastric pouch and the gastro-jejunal anastomosis.  Operative Note: The patient is under general anesthesia.  Dr. Excell Seltzer is laparoscoping the patient while I do an upper endoscopy to evaluate the stomach pouch and gastrojejunal anastomosis.  With the patient intubated, I passed the Pentax endoscope without difficulty down the esophagus.  The esophago-gastric junction was at 40 cm.  The gastro-jejunal anastomosis was at 45 cm.  The mucosa of the stomach looked viable and the staple line was intact without bleeding.  The gastro-jejunal anastomosis looked okay.  While I insufflated the stomach pouch with air, Dr. Excell Seltzer clamped off the efferent limb of the jejunum.  He then flooded the upper abdomen with saline to put the gastric pouch and gastro-jejunal anastomosis under saline.  There was no bubbling or evidence of a leak.  Photos were taken of the anastomosis.  The scope was then withdrawn.  The esophagus was unremarkable and the patient tolerated the endoscopy without difficulty.  Alphonsa Overall, MD, [facility] Surgery Pager: [phone] Office phone:  [phone]

## 2014-11-05 NOTE — Transfer of Care (Signed)
Immediate Anesthesia Transfer of Care Note  Patient: Brian Newton  Procedure(s) Performed: Procedure(s): LAPAROSCOPIC ROUX-EN-Y GASTRIC BYPASS WITH UPPER ENDOSCOPY (N/A) UPPER GI ENDOSCOPY  Patient Location: PACU  Anesthesia Type:General  Level of Consciousness: awake, alert  and oriented  Airway & Oxygen Therapy: Patient Spontanous Breathing and Patient connected to face mask oxygen  Post-op Assessment: Report given to PACU RN and Post -op Vital signs reviewed and stable  Post vital signs: Reviewed and stable  Complications: No apparent anesthesia complications

## 2014-11-05 NOTE — Interval H&P Note (Signed)
History and Physical Interval Note:  11/05/2014 1:15 PM  Brian Newton  has presented today for surgery, with the diagnosis of Morbid Obesity  The various methods of treatment have been discussed with the patient and family. After consideration of risks, benefits and other options for treatment, the patient has consented to  Procedure(s): LAPAROSCOPIC ROUX-EN-Y GASTRIC BYPASS WITH UPPER ENDOSCOPY (N/A) as a surgical intervention .  The patient's history has been reviewed, patient examined, no change in status, stable for surgery.  I have reviewed the patient's chart and labs.  Questions were answered to the patient's satisfaction.     [name] T

## 2014-11-05 NOTE — Anesthesia Preprocedure Evaluation (Addendum)
Anesthesia Evaluation  Patient identified by MRN, date of birth, ID band Patient awake    Reviewed: Allergy & Precautions, H&P , NPO status , Patient's Chart, lab work & pertinent test results, reviewed documented beta blocker date and time   Airway Mallampati: III  TM Distance: >3 FB Neck ROM: full    Dental no notable dental hx. (+) Teeth Intact, Dental Advisory Given   Pulmonary sleep apnea and Continuous Positive Airway Pressure Ventilation ,  breath sounds clear to auscultation  Pulmonary exam normal       Cardiovascular hypertension, Pt. on medications and Pt. on home beta blockers Rhythm:regular Rate:Normal     Neuro/Psych negative neurological ROS  negative psych ROS   GI/Hepatic negative GI ROS, Neg liver ROS,   Endo/Other  Morbid obesityBorderline DM  Renal/GU negative Renal ROS  negative genitourinary   Musculoskeletal   Abdominal (+) + obese,   Peds  Hematology negative hematology ROS (+)   Anesthesia Other Findings   Reproductive/Obstetrics negative OB ROS                            Anesthesia Physical Anesthesia Plan  ASA: III  Anesthesia Plan: General   Post-op Pain Management:    Induction: Intravenous  Airway Management Planned: Oral ETT  Additional Equipment:   Intra-op Plan:   Post-operative Plan: Extubation in OR  Informed Consent: I have reviewed the patients History and Physical, chart, labs and discussed the procedure including the risks, benefits and alternatives for the proposed anesthesia with the patient or authorized representative who has indicated his/her understanding and acceptance.   Dental Advisory Given  Plan Discussed with: CRNA and Surgeon  Anesthesia Plan Comments:         Anesthesia Quick Evaluation

## 2014-11-05 NOTE — Op Note (Signed)
Preop diagnosis: Morbid obesity  Postop diagnosis: Morbid obesity  Body mass index is 52.59 kg/(m^2).  Surgical procedure: Laparoscopic Roux-en-Y gastric bypass  Surgeon: Marland Kitchen T.Marcques Wrightsman M.D.  Asst.: Alphonsa Overall M.D.  Anesthesia: General  Complications:  None  EBL: Minimal  Drains: None  Disposition: PACU in good condition  Description of procedure: Patient is brought to the operating room and general anesthesia induced. She had received preoperative broad-spectrum IV antibiotics and subcutaneous heparin. The abdomen was widely sterilely prepped and draped. Patient timeout was performed and correct patient and procedure confirmed. Access was obtained with a 12 mm Optiview trocar in the left upper quadrant and pneumoperitoneum established without difficulty. Under direct vision 12 mm trocars were placed laterally in the right upper quadrant, right upper quadrant midclavicular line, and to the left and above the umbilicus for the camera port. A 5 mm trocar was placed laterally in the left upper quadrant. The omentum was brought into the upper abdomen and the transverse mesocolon elevated and the ligament of Treitz clearly identified. A 40 cm biliopancreatic limb was then carefully measured from the ligament of Treitz. The small intestine was divided at this point with a single firing of the white load linear stapler. A Penrose drain was sutured to the end of the Roux-en-Y limb for later identification. A 100 cm Roux-en-Y limb was then carefully measured. At this point a side-to-side anastomosis was created between the Roux limb and the end of the biliopancreatic limb. This was accomplished with a single firing of the 45 mm white load linear stapler. The common enterotomy was closed with a running 2-0 Vicryl begun at either end of the enterotomy and tied centrally. The mesenteric defect was then closed with running 2-0 silk. The omentum was then divided with the harmonic scalpel up towards  the transverse colon to allow mobility of the Roux limb toward the gastric pouch. The patient was then placed in steep reversed Trendelenburg. Through a 5 mm subxiphoid site the [facility] retractor was placed and the left lobe of the liver elevated with excellent exposure of the upper stomach and hiatus. The angle of Hiss was then mobilized with the harmonic scalpel. A 4 cm gastric pouch was then carefully measured along the lesser curve of the stomach. Dissection was carried along the lesser curve at this point with the Harmonic scalpel working carefully back toward the lesser sac at right angles to the lesser curve. The free lesser sac was then entered. After being sure all tubes were removed from the stomach an initial firing of the gold load 60 mm linear stapler was fired at right angles across the lesser curve for about 4 cm. The gastric pouch was further mobilized posteriorly and then the pouch was completed with 2 further firings of the 60 mm blue load linear stapler up through the previously dissected angle of His. It was ensured that the pouch was completely mobilized away from the gastric remnant. This created a nice tubular 4-5 cm gastric pouch. The staple line of the gastric remnant was then oversewn with 2-0 silk for hemostasis. The Roux limb was then brought up in an antecolic fashion with the candycane facing to the patient's left without undue tension. The gastrojejunostomy was created with an initial posterior row of 2-0 Vicryl between the Roux limb and the staple line of the gastric pouch. Enterotomies were then made in the gastric pouch and the Roux limb with the harmonic scalpel and at approximately 2-2-1/2 cm anastomosis was created with a single  firing of the blue load linear stapler. The staple line was inspected and was intact without bleeding. The common enterotomy was then closed with running 2-0 Vicryl begun at either end and tied centrally. The wall tube was then easily passed through the  anastomosis and an outer anterior layer of running 2-0 Vicryl was placed. The Ewald tube was removed. With the outlet of the gastrojejunostomy clamped and under saline irrigation the assistant performed upper endoscopy and with the gastric pouch tensely distended with air there was no evidence of leak. The pouch was desufflated. The Terance Hart defect was closed with running 2-0 silk. The abdomen was inspected for any evidence of bleeding or bowel injury and everything looked fine. The Nathanson retractor was removed under direct vision after coating the anastomosis with Tisseel tissue sealant. All CO2 was evacuated and trochars removed. Skin incisions were closed with staples. Sponge needle and instrument counts were correct. The patient was taken to the PACU in good condition.     Edward Jolly MD, FACS  11/05/2014, 4:43 PM

## 2014-11-05 NOTE — Anesthesia Postprocedure Evaluation (Addendum)
  Anesthesia Post-op Note  Patient: Brian Newton  Procedure(s) Performed: Procedure(s) (LRB): LAPAROSCOPIC ROUX-EN-Y GASTRIC BYPASS WITH UPPER ENDOSCOPY (N/A) UPPER GI ENDOSCOPY  Patient Location: PACU  Anesthesia Type: General  Level of Consciousness: awake and alert   Airway and Oxygen Therapy: Patient Spontanous Breathing  Post-op Pain: mild  Post-op Assessment: Post-op Vital signs reviewed, Patient's Cardiovascular Status Stable, Respiratory Function Stable, Patent Airway and No signs of Nausea or vomiting  Last Vitals:  Filed Vitals:   11/05/14 1744  BP: 138/58  Pulse: 61  Temp: 36.4 C  Resp:     Post-op Vital Signs: stable   Complications: No apparent anesthesia complications. To floor on CO2 monitoring. OSA monitoring.

## 2014-11-06 ENCOUNTER — Inpatient Hospital Stay (HOSPITAL_COMMUNITY): Payer: Federal, State, Local not specified - PPO

## 2014-11-06 LAB — COMPREHENSIVE METABOLIC PANEL
ALK PHOS: 70 U/L (ref 39–117)
ALT: 59 U/L — AB (ref 0–53)
AST: 37 U/L (ref 0–37)
Albumin: 3.8 g/dL (ref 3.5–5.2)
Anion gap: 14 (ref 5–15)
BILIRUBIN TOTAL: 0.3 mg/dL (ref 0.3–1.2)
BUN: 12 mg/dL (ref 6–23)
CO2: 26 mEq/L (ref 19–32)
Calcium: 9.3 mg/dL (ref 8.4–10.5)
Chloride: 98 mEq/L (ref 96–112)
Creatinine, Ser: 0.91 mg/dL (ref 0.50–1.35)
GFR calc non Af Amer: >90 mL/min (ref >90)
Glucose, Bld: 129 mg/dL — ABNORMAL HIGH (ref 70–99)
POTASSIUM: 4.6 meq/L (ref 3.7–5.3)
SODIUM: 138 meq/L (ref 137–147)
Total Protein: 7.2 g/dL (ref 6.0–8.3)

## 2014-11-06 LAB — CBC WITH DIFFERENTIAL/PLATELET
Basophils Absolute: 0 10*3/uL (ref 0.0–0.1)
Basophils Relative: 0 % (ref 0–1)
Eosinophils Absolute: 0 10*3/uL (ref 0.0–0.7)
Eosinophils Relative: 0 % (ref 0–5)
HCT: 45.5 % (ref 39.0–52.0)
Hemoglobin: 15.1 g/dL (ref 13.0–17.0)
LYMPHS ABS: 0.8 10*3/uL (ref 0.7–4.0)
Lymphocytes Relative: 8 % — ABNORMAL LOW (ref 12–46)
MCH: 29.5 pg (ref 26.0–34.0)
MCHC: 33.2 g/dL (ref 30.0–36.0)
MCV: 88.9 fL (ref 78.0–100.0)
Monocytes Absolute: 0.6 10*3/uL (ref 0.1–1.0)
Monocytes Relative: 6 % (ref 3–12)
NEUTROS ABS: 9.1 10*3/uL — AB (ref 1.7–7.7)
NEUTROS PCT: 86 % — AB (ref 43–77)
PLATELETS: 284 10*3/uL (ref 150–400)
RBC: 5.12 MIL/uL (ref 4.22–5.81)
RDW: 13.2 % (ref 11.5–15.5)
WBC: 10.4 10*3/uL (ref 4.0–10.5)

## 2014-11-06 LAB — HEMOGLOBIN AND HEMATOCRIT, BLOOD
HCT: 43.7 % (ref 39.0–52.0)
Hemoglobin: 14.5 g/dL (ref 13.0–17.0)

## 2014-11-06 MED ORDER — IOHEXOL 300 MG/ML  SOLN
50.0000 mL | Freq: Once | INTRAMUSCULAR | Status: AC | PRN
  Administered 2014-11-06: 50 mL via ORAL

## 2014-11-06 NOTE — Progress Notes (Addendum)
Patient refused  ETCO2, He does have CPAP, he voiced that he feels he will lose the seal on  CPAP by wearing both.  Sats in 97, and pulse 90. Pt will wear ETCO2 monitoring during the day.

## 2014-11-06 NOTE — Progress Notes (Signed)
Patient alert and oriented, Post op day 1.  Provided support and encouragement.  Encouraged pulmonary toilet, ambulation and small sips of liquids.  All questions answered.  Will continue to monitor. 

## 2014-11-06 NOTE — Plan of Care (Signed)
Problem: Phase I Progression Outcomes Goal: Pain controlled with appropriate interventions Outcome: Completed/Met Date Met:  18-Apr-2014

## 2014-11-06 NOTE — Care Management Note (Signed)
    Page 1 of 2   11/06/2014     10:43:58 AM CARE MANAGEMENT NOTE 11/06/2014  Patient:  Brian Newton, Brian Newton   Account Number:  [id number]  Date Initiated:  11/06/2014  Documentation initiated by:  Olman Yono  Subjective/Objective Assessment:   Body mass index is 52.59 kg/(m 2).  Laparoscopic Roux-en-Y gastric bypass      Action/Plan:   home when stable   Anticipated DC Date:  11/08/2014   Anticipated DC Plan:  HOME/SELF CARE  In-house referral  NA      DC Planning Services  CM consult      PAC Choice  NA   Choice offered to / List presented to:  NA      DME agency  NA     Glide arranged  NA      [place] agency  NA   Status of service:  In process, will continue to follow Medicare Important Message given?  NA - LOS <3 / Initial given by admissions (If response is "NO", the following Medicare IM given date fields will be blank) Date Medicare IM given:   Medicare IM given by:   Date Additional Medicare IM given:   Additional Medicare IM given by:    Discharge Disposition:    Per UR Regulation:  Reviewed for med. necessity/level of care/duration of stay  If discussed at [place] of Stay Meetings, dates discussed:    Comments:  [id number]/Suellyn Meenan Rosana Hoes, RN, BSN, CCM Chart reviewed. Discharge needs and patient's stay to be reviewed and followed by case manager.

## 2014-11-06 NOTE — Plan of Care (Signed)
Problem: Phase I Progression Outcomes Goal: Voiding-avoid urinary catheter unless indicated Outcome: Completed/Met Date Met:  11/06/14     

## 2014-11-06 NOTE — Progress Notes (Signed)
Patient ID: Brian Newton, male   DOB: 12-08-1966, 48 y.o.   MRN: [id number] 1 Day Post-Op  Subjective: No complaints this morning. Denies pain or nausea. Has been up walking in the halls.  Objective: Vital signs in last 24 hours: Temp:  [97.5 F (36.4 C)-98.6 F (37 C)] 98.3 F (36.8 C) (11/18 1000) Pulse Rate:  [61-79] 68 (11/18 1200) Resp:  [18-24] 18 (11/18 1000) BP: (124-157)/(54-73) 138/59 mmHg (11/18 1200) SpO2:  [90 %-99 %] 97 % (11/18 1000) Last BM Date: 11/04/14  Intake/Output from previous day: 11/17 0701 - 11/18 0700 In: 3200 [I.V.:3200] Out: 1375 [Urine:425; Blood:950] Intake/Output this shift: Total I/O In: -  Out: 500 [Urine:500]  General appearance: alert, cooperative and no distress Resp: no wheezing or increased work of breathing GI: normal findings: soft, non-tender Incision/Wound: no evidence of infection  Lab Results:   Recent Labs  11/05/14 1909 11/06/14 0403  WBC  --  10.4  HGB 14.8 15.1  HCT 44.5 45.5  PLT  --  284   BMET  Recent Labs  11/06/14 0403  NA 138  K 4.6  CL 98  CO2 26  GLUCOSE 129*  BUN 12  CREATININE 0.91  CALCIUM 9.3     Studies/Results: Dg Ugi W/water Sol Cm  11/06/2014   CLINICAL DATA:  Post gastric bypass  EXAM: UPPER GI SERIES WITH KUB  TECHNIQUE: After obtaining a scout radiograph a routine upper GI series was performed using thin barium  FLUOROSCOPY TIME:  53 seconds  COMPARISON:  None.  FINDINGS: Scout radiograph demonstrates surgical sutures in the left upper abdomen.  Normal gastric pouch.  No evidence of leak.  After a brief delay, contrast opacifies the efferent limb.  Reflux of contrast into the distal esophagus.  IMPRESSION: No evidence of leak.  Delayed emptying of contrast into the efferent limb, suggesting edema at the gastrojejunostomy.  Gastroesophageal reflux.   Electronically Signed   By: Julian Hy M.D.   On: 11/06/2014 11:00    Anti-infectives: Anti-infectives    Start     Dose/Rate  Route Frequency Ordered Stop   11/05/14 1053  levofloxacin (LEVAQUIN) IVPB 750 mg     750 mg100 mL/hr over 90 Minutes Intravenous On call to O.R. 11/05/14 1053 11/05/14 1317      Assessment/Plan: s/p Procedure(s): LAPAROSCOPIC ROUX-EN-Y GASTRIC BYPASS WITH UPPER ENDOSCOPY UPPER GI ENDOSCOPY Doing well without evidence of complication. Begin postoperative day #1 diet.   LOS: 1 day    Mandisa Persinger T 11/06/2014

## 2014-11-06 NOTE — Plan of Care (Signed)
Problem: Phase II Progression Outcomes Goal: CPAP/BI-PAP utilized with sleeping per order Outcome: Completed/Met Date Met:  11/06/14

## 2014-11-06 NOTE — Plan of Care (Signed)
Problem: Phase I Progression Outcomes Goal: Diet - NPO Outcome: Completed/Met Date Met:  11/06/14

## 2014-11-06 NOTE — Progress Notes (Signed)
Discussed UGI results with Dr Excell Seltzer orders received

## 2014-11-06 NOTE — Plan of Care (Signed)
Problem: Phase II Progression Outcomes Goal: Surgical site without signs of infection Outcome: Completed/Met Date Met:  11/06/14

## 2014-11-06 NOTE — Plan of Care (Signed)
Problem: Phase II Progression Outcomes Goal: Tolerating diet advance to Outcome: Progressing

## 2014-11-06 NOTE — Plan of Care (Signed)
Problem: Phase II Progression Outcomes Goal: Upper GI SWALLOW ACCEPTABLE Outcome: Completed/Met Date Met:  11/06/14

## 2014-11-06 NOTE — Plan of Care (Signed)
Problem: Food- and Nutrition-Related Knowledge Deficit (NB-1.1) Goal: Nutrition education Formal process to instruct or train a patient/client in a skill or to impart knowledge to help patients/clients voluntarily manage or modify food choices and eating behavior to maintain or improve health. Outcome: Completed/Met Date Met:  11/06/14 Nutrition Education Note  Received consult for diet education per DROP protocol.   11/17 s/p Procedure(s) (LRB): LAPAROSCOPIC ROUX-EN-Y GASTRIC BYPASS WITH UPPER ENDOSCOPY (N/A) UPPER GI ENDOSCOPY  Discussed 2 week post op diet with pt. Emphasized that liquids must be non carbonated, non caffeinated, and sugar free. Fluid goals discussed. Pt to follow up with outpatient bariatric RD for further diet progression after 2 weeks. Multivitamins and minerals also reviewed. Teach back method used, pt expressed understanding, expect good compliance.   Diet: First 2 Weeks  You will see the nutritionist about two (2) weeks after your surgery. The nutritionist will increase the types of foods you can eat if you are handling liquids well:  If you have severe vomiting or nausea and cannot handle clear liquids lasting longer than 1 day, call your surgeon  Protein Shake  Drink at least 2 ounces of shake 5-6 times per day  Each serving of protein shakes (usually 8 - 12 ounces) should have a minimum of:  15 grams of protein  And no more than 5 grams of carbohydrate  Goal for protein each day:  Men = 80 grams per day  Women = 60 grams per day  Protein powder may be added to fluids such as non-fat milk or Lactaid milk or Soy milk (limit to 35 grams added protein powder per serving)   Hydration  Slowly increase the amount of water and other clear liquids as tolerated (See Acceptable Fluids)  Slowly increase the amount of protein shake as tolerated  Sip fluids slowly and throughout the day  May use sugar substitutes in small amounts (no more than 6 - 8 packets per day; i.e.  Splenda)   Fluid Goal  The first goal is to drink at least 8 ounces of protein shake/drink per day (or as directed by the nutritionist); some examples of protein shakes are [employer], [employer], EAS Edge HP, and Unjury. See handout from pre-op Bariatric Education Class:  Slowly increase the amount of protein shake you drink as tolerated  You may find it easier to slowly sip shakes throughout the day  It is important to get your proteins in first  Your fluid goal is to drink 64 - 100 ounces of fluid daily  It may take a few weeks to build up to this  32 oz (or more) should be clear liquids  And  32 oz (or more) should be full liquids (see below for examples)  Liquids should not contain sugar, caffeine, or carbonation   Clear Liquids:  Water or Sugar-free flavored water (i.e. Fruit H2O, Propel)  Decaffeinated coffee or tea (sugar-free)  Crystal Lite, Wyler's Lite, Minute Maid Lite  Sugar-free Jell-O  Bouillon or broth  Sugar-free Popsicle: *Less than 20 calories each; Limit 1 per day   Full Liquids:  Protein Shakes/Drinks + 2 choices per day of other full liquids  Full liquids must be:  No More Than 12 grams of Carbs per serving  No More Than 3 grams of Fat per serving  Strained low-fat cream soup  Non-Fat milk  Fat-free Lactaid Milk  Sugar-free yogurt (Dannon Lite & Fit, Greek yogurt)     Clayton Bibles, MS, RD, LDN Pager: [phone] After  Hours Pager: [phone]

## 2014-11-06 NOTE — Plan of Care (Signed)
Problem: Phase I Progression Outcomes Goal: Operative site clean or minimal drainage Outcome: Completed/Met Date Met:  11/06/14

## 2014-11-06 NOTE — Plan of Care (Signed)
Problem: Phase I Progression Outcomes Goal: CPAP/BI-PAP utilized with sleeping per order Outcome: Completed/Met Date Met:  11/06/14

## 2014-11-07 ENCOUNTER — Encounter (HOSPITAL_COMMUNITY): Payer: Self-pay | Admitting: General Surgery

## 2014-11-07 LAB — CBC WITH DIFFERENTIAL/PLATELET
Basophils Absolute: 0 10*3/uL (ref 0.0–0.1)
Basophils Relative: 0 % (ref 0–1)
EOS ABS: 0.1 10*3/uL (ref 0.0–0.7)
Eosinophils Relative: 1 % (ref 0–5)
HEMATOCRIT: 43.2 % (ref 39.0–52.0)
Hemoglobin: 14 g/dL (ref 13.0–17.0)
LYMPHS ABS: 2.4 10*3/uL (ref 0.7–4.0)
Lymphocytes Relative: 20 % (ref 12–46)
MCH: 28.9 pg (ref 26.0–34.0)
MCHC: 32.4 g/dL (ref 30.0–36.0)
MCV: 89.3 fL (ref 78.0–100.0)
MONO ABS: 1.1 10*3/uL — AB (ref 0.1–1.0)
Monocytes Relative: 9 % (ref 3–12)
Neutro Abs: 8.7 10*3/uL — ABNORMAL HIGH (ref 1.7–7.7)
Neutrophils Relative %: 70 % (ref 43–77)
PLATELETS: 320 10*3/uL (ref 150–400)
RBC: 4.84 MIL/uL (ref 4.22–5.81)
RDW: 14.2 % (ref 11.5–15.5)
WBC: 12.2 10*3/uL — ABNORMAL HIGH (ref 4.0–10.5)

## 2014-11-07 NOTE — Progress Notes (Signed)
Patient alert and oriented, pain is controlled. Patient is tolerating fluids, advanced to protein shake today, tolerating well. Reviewed Gastric Bypass discharge instructions with patient and patient is able to articulate understanding. Provided information on BELT program, Support Group and [facility] outpatient pharmacy. All questions answered, will continue to monitor.

## 2014-11-07 NOTE — Progress Notes (Signed)
DC instructions given and reviewer with the pt. All questions and concerns were addressed. Pt alert and oriented times 4, VSS, skin intact and incisions are unremarkable, no s/s of distress or discomfort at this time, pt denies any pain. DC home with family.

## 2014-11-07 NOTE — Plan of Care (Signed)
Problem: Phase I Progression Outcomes Goal: OOB as tolerated unless otherwise ordered Outcome: Completed/Met Date Met:  11/07/14 Goal: Initial discharge plan identified Outcome: Completed/Met Date Met:  11/07/14 Goal: Hemodynamically stable Outcome: Completed/Met Date Met:  11/07/14 Goal: Pulmonary function stable Outcome: Completed/Met Date Met:  11/07/14  Problem: Phase II Progression Outcomes Goal: Pain controlled on oral analgesia Outcome: Completed/Met Date Met:  11/07/14 Goal: Activity at appropriate level-compared to baseline (UP IN CHAIR FOR HEMODIALYSIS)  Outcome: Completed/Met Date Met:  11/07/14 Goal: Hemodynamically stable Outcome: Completed/Met Date Met:  11/07/14 Goal: Pulmonary function stable Outcome: Completed/Met Date Met:  11/07/14

## 2014-11-07 NOTE — Discharge Summary (Signed)
   Patient ID: Brian Newton [id number] 48 y.o. 04/01/1966  11/05/2014  Discharge date and time: 11/07/2014   Admitting Physician: Excell Seltzer T  Discharge Physician: Excell Seltzer T  Admission Diagnoses: Morbid Obesity  Discharge Diagnoses: Same  Operations: Procedure(s): LAPAROSCOPIC ROUX-EN-Y GASTRIC BYPASS WITH UPPER ENDOSCOPY UPPER GI ENDOSCOPY  Admission Condition: good  Discharged Condition: good  Indication for Admission: Patient is a 48 year old male with progressive morbid obesity unresponsive to medical management who presents at a BMI of 55 and multiple comorbidities including obstructive sleep apnea, hypertension, chronic joint pain and metabolic syndrome. After extensive preoperative workup and discussion detailed elsewhere with electrocautery proceed with laparoscopic Roux-en-Y gastric bypass for initial surgical treatment for his morbid obesity  Hospital Course: On the morning of admission the patient underwent an uneventful laparoscopic Roux-en-Y gastric bypass. His postoperative course was uncomplicated. He had minimal discomfort and no nausea postoperatively. Gastrografin swallow on the first postoperative day showed no leak or obstruction and he was started on water by mouth. He tolerated this well. On the second postoperative day he is afebrile. Vital signs within normal limits. Denies pain or nausea. Ambulatory. Starting protein shakes. He is felt ready for discharge.   Disposition: Home  Patient Instructions:    Medication List    STOP taking these medications        aspirin-acetaminophen-caffeine 250-250-65 MG per tablet  Commonly known as:  EXCEDRIN MIGRAINE     meloxicam 15 MG tablet  Commonly known as:  MOBIC      TAKE these medications        amLODipine-benazepril 5-20 MG per capsule  Commonly known as:  LOTREL  TAKE (2) CAPSULES BY MOUTH ONCE EVERY MORNING.     aspirin 81 MG tablet  Take 81 mg by mouth every morning.     cholecalciferol 1000 UNITS tablet  Commonly known as:  VITAMIN D  Take 1,000 Units by mouth every morning.     Fish Oil 1000 MG Caps  Take 1,000 mg by mouth every morning.     furosemide 20 MG tablet  Commonly known as:  LASIX  TAKE 1 TABLET BY MOUTH ONCE DAILY AS NEEDED FOR LEG SWELLING.     glucosamine-chondroitin 500-400 MG tablet  Take 1 tablet by mouth every morning.     HYDROcodone-acetaminophen 5-325 MG per tablet  Commonly known as:  NORCO  Take 1 tablet by mouth every 6 (six) hours as needed for moderate pain.     loratadine 10 MG tablet  Commonly known as:  CLARITIN  Take 10 mg by mouth every morning.     metoprolol succinate 25 MG 24 hr tablet  Commonly known as:  TOPROL-XL  TAKE 1 TABLET BY MOUTH ONCE A DAY.     multivitamin with minerals tablet  Take 1 tablet by mouth every morning.     phentermine 37.5 MG tablet  Commonly known as:  ADIPEX-P  Take 1 tablet (37.5 mg total) by mouth daily before breakfast.     pravastatin 40 MG tablet  Commonly known as:  PRAVACHOL  TAKE (1) TABLET BY MOUTH ONCE DAILY.     SUMAtriptan 50 MG tablet  Commonly known as:  IMITREX  May repeat in 2 hours if headache persists or recurs.        Activity: activity as tolerated Diet: Bariatric protein shakes Wound Care: none needed  Follow-up:  With Dr. Excell Seltzer in 3 weeks.  Signed: Edward Jolly MD, FACS  11/07/2014, 7:01 AM

## 2014-11-07 NOTE — Discharge Instructions (Signed)
Per bariatric nurse ° ° ° °GASTRIC BYPASS/SLEEVE ° Home Care Instructions ° ° These instructions are to help you care for yourself when you go home. ° °Call: If you have any problems. °• Call [phone] and ask for the surgeon on call °• If you need immediate assistance come to the ER at [place]. Tell the ER staff you are a new post-op gastric bypass or gastric sleeve patient  °Signs and symptoms to report: • Severe  vomiting or nausea °o If you cannot handle clear liquids for longer than 1 day, call your surgeon °• Abdominal pain which does not get better after taking your pain medication °• Fever greater than 100.4°  F and chills °• Heart rate over 100 beats a minute °• Trouble breathing °• Chest pain °• Redness,  swelling, drainage, or foul odor at incision (surgical) sites °• If your incisions open or pull apart °• Swelling or pain in calf (lower leg) °• Diarrhea (Loose bowel movements that happen often), frequent watery, uncontrolled bowel movements °• Constipation, (no bowel movements for 3 days) if this happens: °o Take Milk of Magnesia, 2 tablespoons by mouth, 3 times a day for 2 days if needed °o Stop taking Milk of Magnesia once you have had a bowel movement °o Call your doctor if constipation continues °Or °o Take Miralax  (instead of Milk of Magnesia) following the label instructions °o Stop taking Miralax once you have had a bowel movement °o Call your doctor if constipation continues °• Anything you think is “abnormal for you” °  °Normal side effects after surgery: • Unable to sleep at night or unable to concentrate °• Irritability °• Being tearful (crying) or depressed ° °These are common complaints, possibly related to your anesthesia, stress of surgery, and change in lifestyle, that usually go away a few weeks after surgery. If these feelings continue, call your medical doctor.  °Wound Care: You may have surgical glue, steri-strips, or staples over your incisions after surgery °• Surgical  glue: Looks like clear film over your incisions and will wear off a little at a time °• Steri-strips: Adhesive strips of tape over your incisions. You may notice a yellowish color on skin under the steri-strips. This is used to make the steri-strips stick better. Do not pull the steri-strips off - let them fall off °• Staples: Staples may be removed before you leave the hospital °o If you go home with staples, call [facility] for an appointment with your surgeon’s nurse to have staples removed 10 days after surgery, [phone] °• Showering: You may shower two (2) days after your surgery unless your surgeon tells you differently °o Wash gently around incisions with warm soapy water, rinse well, and gently pat dry °o If you have a drain (tube from your incision), you may need someone to hold this while you shower °o No tub baths until staples are removed and incisions are healed °  °Medications: • Medications should be liquid or crushed if larger than the size of a dime °• Extended release pills (medication that releases a little bit at a time through the  day) should not be crushed °• Depending on the size and number of medications you take, you may need to space (take a few throughout the day)/change the time you take your medications so that you do not over-fill your pouch (smaller stomach) °• Make sure you follow-up with you primary care physician to make medication changes needed during rapid weight loss and   life -style changes °• If you have diabetes, follow up with your doctor that orders your diabetes medication(s) within one week after surgery and check your blood sugar regularly ° °• Do not drive while taking narcotics (pain medications) ° °• Do not take acetaminophen (Tylenol) and Roxicet or Lortab Elixir at the same time since these pain medications contain acetaminophen °  °Diet:  °First 2 Weeks You will see the nutritionist about two (2) weeks after your surgery. The nutritionist will  increase the types of foods you can eat if you are handling liquids well: °• If you have severe vomiting or nausea and cannot handle clear liquids lasting longer than 1 day call your surgeon °Protein Shake °• Drink at least 2 ounces of shake 5-6 times per day °• Each serving of protein shakes (usually 8-12 ounces) should have a minimum of: °o 15 grams of protein °o And no more than 5 grams of carbohydrate °• Goal for protein each day: °o Men = 80 grams per day °o Women = 60 grams per day °  ° • Protein powder may be added to fluids such as non-fat milk or Lactaid milk or Soy milk (limit to 35 grams added protein powder per serving) ° °Hydration °• Slowly increase the amount of water and other clear liquids as tolerated (See Acceptable Fluids) °• Slowly increase the amount of protein shake as tolerated °• Sip fluids slowly and throughout the day °• May use sugar substitutes in small amounts (no more than 6-8 packets per day; i.e. Splenda) ° °Fluid Goal °• The first goal is to drink at least 8 ounces of protein shake/drink per day (or as directed by the nutritionist); some examples of protein shakes are Syntrax Nectar, Adkins Advantage, EAS Edge HP, and Unjury. - See handout from pre-op Bariatric Education Class: °o Slowly increase the amount of protein shake you drink as tolerated °o You may find it easier to slowly sip shakes throughout the day °o It is important to get your proteins in first °• Your fluid goal is to drink 64-100 ounces of fluid daily °o It may take a few weeks to build up to this  °• 32 oz. (or more) should be clear liquids °And °• 32 oz. (or more) should be full liquids (see below for examples) °• Liquids should not contain sugar, caffeine, or carbonation ° °Clear Liquids: °• Water of Sugar-free flavored water (i.e. Fruit H²O, Propel) °• Decaffeinated coffee or tea (sugar-free) °• Crystal lite, Wyler’s Lite, Minute Maid Lite °• Sugar-free Jell-O °• Bouillon or broth °• Sugar-free Popsicle:    -  Less than 20 calories each; Limit 1 per day ° °Full Liquids: °                  Protein Shakes/Drinks + 2 choices per day of other full liquids °• Full liquids must be: °o No More Than 12 grams of Carbs per serving °o No More Than 3 grams of Fat per serving °• Strained low-fat cream soup °• Non-Fat milk °• Fat-free Lactaid Milk °• Sugar-free yogurt (Dannon Lite & Fit, Greek yogurt) ° °  °Vitamins and Minerals • Start 1 day after surgery unless otherwise directed by your surgeon °• 2 Chewable Multivitamin / Multimineral Supplement with iron (i.e. Centrum for Adults) °• Vitamin B-12, 350-500 micrograms sub-lingual (place tablet under the tongue) each day °• Chewable Calcium Citrate with Vitamin D-3 °(Example: 3 Chewable Calcium  Plus 600 with Vitamin D-3) °o Take 500 mg three (3)   times a day for a total of 1500 mg each day °o Do not take all 3 doses of calcium at one time as it may cause constipation, and you can only absorb 500 mg at a time °o Do not mix multivitamins containing iron with calcium supplements;  take 2 hours apart °o Do not substitute Tums (calcium carbonate) for your calcium °• Menstruating women and those at risk for anemia ( a blood disease that causes weakness) may need extra iron °o Talk to your doctor to see if you need more iron °• If you need extra iron: Total daily Iron recommendation (including Vitamins) is 50 to 100 mg Iron/day °• Do not stop taking or change any vitamins or minerals until you talk to your nutritionist or surgeon °• Your nutritionist and/or surgeon must approve all vitamin and mineral supplements °  °Activity and Exercise: It is important to continue walking at home. Limit your physical activity as instructed by your doctor. During this time, use these guidelines: °• Do not lift anything greater than ten  (10) pounds for at least two (2) weeks °• Do not go back to work or drive until your surgeon says you can °• You may have sex when you feel comfortable °o It is VERY  important for male patients to use a reliable birth control method; fertility often increase after surgery °o Do not get pregnant for at least 18 months °• Start exercising as soon as your doctor tells you that you can °o Make sure your doctor approves any physical activity °• Start with a simple walking program °• Walk 5-15 minutes each day, 7 days per week °• Slowly increase until you are walking 30-45 minutes per day °• Consider joining our BELT program. [phone] or email [email or web address] °  °Special Instructions Things to remember: °• Free counseling is available for you and your family through collaboration between [facility] and [employer]. Please call [phone] and leave a message °• Use your CPAP when sleeping if this applies to you °• Consider buying a medical alert bracelet that says you had lap-band surgery °  °  You will likely have your first fill (fluid added to your band) 6 - 8 weeks after surgery °• [facility] has a free Bariatric Surgery Support Group that meets monthly, the 3rd Thursday, 6pm. [facility] Classrooms. You can see classes online at [email or web address] °• It is very important to keep all follow up appointments with your surgeon, nutritionist, primary care physician, and behavioral health practitioner °o After the first year, please follow up with your bariatric surgeon and nutritionist at least once a year in order to maintain best weight loss results °      °             [facility]:  [phone] ° °             [facility]: [phone] ° °             Bariatric Nurse Coordinator: [phone]  °Gastric Bypass/Sleeve Home Care Instructions  Rev. 01/2013    ° °                                                    Reviewed and Endorsed °                                                     by [facility] Patient Education Committee, Jan, 2014 ° ° ° ° ° ° ° ° ° °

## 2014-11-08 ENCOUNTER — Telehealth (HOSPITAL_COMMUNITY): Payer: Self-pay

## 2014-11-08 NOTE — Telephone Encounter (Signed)
Made discharge phone call to patient per DROP protocol. Asking the following questions.    1. Do you have someone to care for you now that you are home?  yes 2. Are you having pain now that is not relieved by your pain medication?  no 3. Are you able to drink the recommended daily amount of fluids (48 ounces minimum/day) and protein (60-80 grams/day) as prescribed by the dietitian or nutritional counselor? Working on it, had protein shake and greek yogurt yesterday -- today I have had a shake and 16 oz of clears today so far  4. Are you taking the vitamins and minerals as prescribed?  yes 5. Do you have the "on call" number to contact your surgeon if you have a problem or question?  yes 6. Are your incisions free of redness, swelling or drainage? (If steri strips, address that these can fall off, shower as tolerated) yes 7. Have your bowels moved since your surgery?  If not, are you passing gas?  yes 8. Are you up and walking 3-4 times per day?  yes    1. Do you have an appointment made to see your surgeon in the next month?  yes 2. Were you provided your discharge medications before your surgery or before you were discharged from the hospital and are you taking them without problem?  yes 3. Were you provided phone numbers to the clinic/surgeon's office?  yes 4. Did you watch the patient education video module in the (clinic, surgeon's office, etc.) before your surgery? yes 5. Do you have a discharge checklist that was provided to you in the hospital to reference with instructions on how to take care of yourself after surgery?  yes 6. Did you see a dietitian or nutritional counselor while you were in the hospital?  yes 7. Do you have an appointment to see a dietitian or nutritional counselor in the next month?  yes

## 2014-11-19 ENCOUNTER — Encounter: Payer: Federal, State, Local not specified - PPO | Attending: General Surgery

## 2014-11-19 DIAGNOSIS — Z713 Dietary counseling and surveillance: Secondary | ICD-10-CM | POA: Insufficient documentation

## 2014-11-19 DIAGNOSIS — Z6841 Body Mass Index (BMI) 40.0 and over, adult: Secondary | ICD-10-CM | POA: Diagnosis not present

## 2014-11-19 NOTE — Progress Notes (Signed)
Bariatric Class:  Appt start time: 1530 end time:  1630.  2 Week Post-Operative Nutrition Class  Patient was seen on 11/19/14 for [facility]ative [facility]   Surgery date: 11/05/2014 Surgery type: RYGB Start weight at [facility]: 344.5 on 07/25/14 Weight today: 306.0 lbs Weight change: 38.5 lbs  TANITA  BODY COMP RESULTS  No pre op tanita 11/19/14   BMI (kg/m^2)  50.9   Fat Mass (lbs)  152.5   Fat Free Mass (lbs)  153.5   Total Body Water (lbs)  112.5    The following the learning objectives were met by the patient during this course:  Identifies Phase 3A (Soft, High Proteins) Dietary Goals and will begin from 2 weeks post-operatively to 2 months post-operatively  Identifies appropriate sources of fluids and proteins   States protein recommendations and appropriate sources post-operatively  Identifies the need for appropriate texture modifications, mastication, and bite sizes when consuming solids  Identifies appropriate multivitamin and calcium sources post-operatively  Describes the need for physical activity post-operatively and will follow MD recommendations  States when to call healthcare provider regarding medication questions or post-operative complications  Handouts given during class include:  Phase 3A: Soft, High Protein Diet Handout  Follow-Up Plan: Patient will follow-up at [facility] in 6 weeks for 2 month post-op nutrition visit for diet advancement per MD.

## 2014-11-19 NOTE — Patient Instructions (Signed)
Patient to follow Phase 3A-Soft, High Protein Diet and follow-up at [facility] in 6 weeks for 2 months post-op nutrition visit for diet advancement. 

## 2014-12-04 ENCOUNTER — Other Ambulatory Visit: Payer: Self-pay | Admitting: Family Medicine

## 2014-12-04 NOTE — Telephone Encounter (Signed)
Medication refilled per protocol. 

## 2015-01-01 ENCOUNTER — Encounter: Payer: Federal, State, Local not specified - PPO | Attending: General Surgery | Admitting: Dietician

## 2015-01-01 DIAGNOSIS — Z6841 Body Mass Index (BMI) 40.0 and over, adult: Secondary | ICD-10-CM | POA: Insufficient documentation

## 2015-01-01 DIAGNOSIS — Z713 Dietary counseling and surveillance: Secondary | ICD-10-CM | POA: Insufficient documentation

## 2015-01-01 NOTE — Patient Instructions (Addendum)
Goals:  Follow Phase 3B: High Protein + Non-Starchy Vegetables  Eat 3-6 small meals/snacks, every 3-5 hrs  Increase lean protein foods to meet 80g goal  Increase fluid intake to 64oz +  Avoid drinking 15 minutes before, during and 30 minutes after eating  Aim for >30 min of physical activity daily   TANITA  BODY COMP RESULTS  pre op  11/19/14 01/01/15   BMI (kg/m^2) 56 50.9 46.3   Fat Mass (lbs) 175.5 152.5 114.5   Fat Free Mass (lbs) 161 153.5 164   Total Body Water (lbs) 118 112.5 120

## 2015-01-01 NOTE — Progress Notes (Signed)
  Follow-up visit:  8 Weeks Post-Operative RYGB Surgery  Medical Nutrition Therapy:  Appt start time: 0810 end time:  0835.  Primary concerns today: Post-operative Bariatric Surgery Nutrition Management.  Brian Newton returns today reporting that he is feeling well and tolerating recommended foods. He has been logging all foods and his blood pressure readings. Brian Newton reports not craving much and not having feelings of hunger.    Surgery date: 11/05/2014 Surgery type: RYGB Start weight at [facility]: 344.5 on 07/25/14 Weight today: 278.5 lbs Weight change: 27.5 lbs Total weight lost: 66 lbs  TANITA  BODY COMP RESULTS  pre op  11/19/14 01/01/15   BMI (kg/m^2) 56 50.9 46.3   Fat Mass (lbs) 175.5 152.5 114.5   Fat Free Mass (lbs) 161 153.5 164   Total Body Water (lbs) 118 112.5 120    Preferred Learning Style:   No preference indicated   Learning Readiness:   Ready  24-hr recall: B (3AM): protein shake (30g) Snk (7AM): yogurt (12g)  L 11AM): 1 oz meat and 1 oz cheese (12g) Snk (PM):   D (7PM): 3 oz meat (21g) Snk (PM): diet hot chocolate with protein powder (20g)  Fluid intake: water (100+ oz) Estimated total protein intake: 80+ g per day  Medications: see list, no changes Supplementation: taking  Drinking while eating: no Hair loss: unknown Carbonated beverages: no N/V/D/C: vomited 1x after eggs; hard stools Dumping syndrome: none  Recent physical activity:  Stationary bike and upper body weights every morning  Progress Towards Goal(s):  In progress.  Handouts given during visit include:  Phase 3B lean protein + non starchy vegetables   Nutritional Diagnosis:  [place]-3.3 Overweight/obesity related to past poor dietary habits and physical inactivity as evidenced by patient w/ recent RYGB surgery following dietary guidelines for continued weight loss.     Intervention:  Nutrition counseling provided.  Teaching Method Utilized:  Visual Auditory  Barriers to  learning/adherence to lifestyle change: none  Demonstrated degree of understanding via:  Teach Back   Monitoring/Evaluation:  Dietary intake, exercise, and body weight. Follow up in 2 months for 4 month post-op visit.

## 2015-01-29 ENCOUNTER — Other Ambulatory Visit: Payer: Self-pay | Admitting: Family Medicine

## 2015-01-29 NOTE — Telephone Encounter (Signed)
Medication refilled per protocol. 

## 2015-03-03 ENCOUNTER — Other Ambulatory Visit: Payer: Federal, State, Local not specified - PPO

## 2015-03-03 DIAGNOSIS — E8881 Metabolic syndrome: Secondary | ICD-10-CM

## 2015-03-03 DIAGNOSIS — R7309 Other abnormal glucose: Secondary | ICD-10-CM

## 2015-03-03 DIAGNOSIS — E7800 Pure hypercholesterolemia, unspecified: Secondary | ICD-10-CM

## 2015-03-03 DIAGNOSIS — I10 Essential (primary) hypertension: Secondary | ICD-10-CM

## 2015-03-03 DIAGNOSIS — Z79899 Other long term (current) drug therapy: Secondary | ICD-10-CM

## 2015-03-03 LAB — CBC WITH DIFFERENTIAL/PLATELET
Basophils Absolute: 0 10*3/uL (ref 0.0–0.1)
Basophils Relative: 0 % (ref 0–1)
EOS ABS: 0.1 10*3/uL (ref 0.0–0.7)
Eosinophils Relative: 2 % (ref 0–5)
HCT: 44.3 % (ref 39.0–52.0)
HEMOGLOBIN: 14.4 g/dL (ref 13.0–17.0)
Lymphocytes Relative: 35 % (ref 12–46)
Lymphs Abs: 2 10*3/uL (ref 0.7–4.0)
MCH: 29.7 pg (ref 26.0–34.0)
MCHC: 32.5 g/dL (ref 30.0–36.0)
MCV: 91.3 fL (ref 78.0–100.0)
MONOS PCT: 10 % (ref 3–12)
MPV: 10.2 fL (ref 8.6–12.4)
Monocytes Absolute: 0.6 10*3/uL (ref 0.1–1.0)
NEUTROS PCT: 53 % (ref 43–77)
Neutro Abs: 3.1 10*3/uL (ref 1.7–7.7)
Platelets: 281 10*3/uL (ref 150–400)
RBC: 4.85 MIL/uL (ref 4.22–5.81)
RDW: 15 % (ref 11.5–15.5)
WBC: 5.8 10*3/uL (ref 4.0–10.5)

## 2015-03-03 LAB — LIPID PANEL
CHOL/HDL RATIO: 3 ratio
Cholesterol: 125 mg/dL (ref 0–200)
HDL: 42 mg/dL (ref >=40)
LDL Cholesterol: 71 mg/dL (ref 0–99)
TRIGLYCERIDES: 59 mg/dL (ref <150)
VLDL: 12 mg/dL (ref 0–40)

## 2015-03-03 LAB — COMPLETE METABOLIC PANEL WITH GFR
ALBUMIN: 4.1 g/dL (ref 3.5–5.2)
ALT: 22 U/L (ref 0–53)
AST: 24 U/L (ref 0–37)
Alkaline Phosphatase: 70 U/L (ref 39–117)
BUN: 17 mg/dL (ref 6–23)
CO2: 25 mEq/L (ref 19–32)
CREATININE: 0.84 mg/dL (ref 0.50–1.35)
Calcium: 9.3 mg/dL (ref 8.4–10.5)
Chloride: 104 mEq/L (ref 96–112)
Glucose, Bld: 77 mg/dL (ref 70–99)
Potassium: 3.9 mEq/L (ref 3.5–5.3)
Sodium: 141 mEq/L (ref 135–145)
TOTAL PROTEIN: 6.1 g/dL (ref 6.0–8.3)
Total Bilirubin: 0.6 mg/dL (ref 0.2–1.2)

## 2015-03-03 LAB — HEMOGLOBIN A1C
Hgb A1c MFr Bld: 5.7 % — ABNORMAL HIGH (ref <5.7)
Mean Plasma Glucose: 117 mg/dL — ABNORMAL HIGH (ref <117)

## 2015-03-03 LAB — TSH: TSH: 1.414 u[IU]/mL (ref 0.350–4.500)

## 2015-03-10 ENCOUNTER — Ambulatory Visit (INDEPENDENT_AMBULATORY_CARE_PROVIDER_SITE_OTHER): Payer: Federal, State, Local not specified - PPO | Admitting: Family Medicine

## 2015-03-10 ENCOUNTER — Encounter: Payer: Self-pay | Admitting: Family Medicine

## 2015-03-10 VITALS — BP 126/82 | HR 74 | Temp 97.9°F | Resp 18 | Ht 65.0 in | Wt 249.0 lb

## 2015-03-10 DIAGNOSIS — E7800 Pure hypercholesterolemia, unspecified: Secondary | ICD-10-CM

## 2015-03-10 DIAGNOSIS — I10 Essential (primary) hypertension: Secondary | ICD-10-CM

## 2015-03-10 NOTE — Progress Notes (Signed)
Subjective:    Patient ID: Brian Newton, male    DOB: 09/11/1966, 49 y.o.   MRN: [id number]  HPI  Patient has lost over 55 pounds since his recent gastric bypass surgery. He has drastically altered his diet. He is on the appropriate vitamin replacement including vitamin B12, calcium, iron supplementation. His blood pressure is really starting to drop and he is starting to feel dizzy as he is losing weight. I believe he can now discontinue Toprol-XL. Furthermore his most recent lab work as listed below and is outstanding. He is interested in possibly cutting back on his cholesterol medication.  Wt Readings from Last 3 Encounters:  03/10/15 249 lb (112.946 kg)  01/01/15 278 lb 8 oz (126.327 kg)  11/19/14 306 lb (138.801 kg)   Lab on 03/03/2015  Component Date Value Ref Range Status  . Sodium 03/03/2015 141  135 - 145 mEq/L Final  . Potassium 03/03/2015 3.9  3.5 - 5.3 mEq/L Final  . Chloride 03/03/2015 104  96 - 112 mEq/L Final  . CO2 03/03/2015 25  19 - 32 mEq/L Final  . Glucose, Bld 03/03/2015 77  70 - 99 mg/dL Final  . BUN 03/03/2015 17  6 - 23 mg/dL Final  . Creat 03/03/2015 0.84  0.50 - 1.35 mg/dL Final  . Total Bilirubin 03/03/2015 0.6  0.2 - 1.2 mg/dL Final  . Alkaline Phosphatase 03/03/2015 70  39 - 117 U/L Final  . AST 03/03/2015 24  0 - 37 U/L Final  . ALT 03/03/2015 22  0 - 53 U/L Final  . Total Protein 03/03/2015 6.1  6.0 - 8.3 g/dL Final  . Albumin 03/03/2015 4.1  3.5 - 5.2 g/dL Final  . Calcium 03/03/2015 9.3  8.4 - 10.5 mg/dL Final  . GFR, Est African American 03/03/2015 >89   Final  . GFR, Est Non African American 03/03/2015 >89   Final   Comment:   The estimated GFR is a calculation valid for adults (>=53 years old) that uses the CKD-EPI algorithm to adjust for age and sex. It is   not to be used for children, pregnant women, hospitalized patients,    patients on dialysis, or with rapidly changing kidney function. According to the NKDEP, eGFR >89 is normal,  60-89 shows mild impairment, 30-59 shows moderate impairment, 15-29 shows severe impairment and <15 is ESRD.     . TSH 03/03/2015 1.414  0.350 - 4.500 uIU/mL Final  . Cholesterol 03/03/2015 125  0 - 200 mg/dL Final   Comment: ATP III Classification:       < 200        mg/dL        Desirable      200 - 239     mg/dL        Borderline High      >= 240        mg/dL        High     . Triglycerides 03/03/2015 59  <150 mg/dL Final  . HDL 03/03/2015 42  >=40 mg/dL Final   ** Please note change in reference range(s). **  . Total CHOL/HDL Ratio 03/03/2015 3.0   Final  . VLDL 03/03/2015 12  0 - 40 mg/dL Final  . LDL Cholesterol 03/03/2015 71  0 - 99 mg/dL Final   Comment:   Total Cholesterol/HDL Ratio:CHD Risk  Coronary Heart Disease Risk Table                                        Men       Women          1/2 Average Risk              3.4        3.3              Average Risk              5.0        4.4           2X Average Risk              9.6        7.1           3X Average Risk             23.4       11.0 Use the calculated Patient Ratio above and the CHD Risk table  to determine the patient's CHD Risk. ATP III Classification (LDL):       < 100        mg/dL         Optimal      100 - 129     mg/dL         Near or Above Optimal      130 - 159     mg/dL         Borderline High      160 - 189     mg/dL         High       > 190        mg/dL         Very High     . WBC 03/03/2015 5.8  4.0 - 10.5 K/uL Final  . RBC 03/03/2015 4.85  4.22 - 5.81 MIL/uL Final  . Hemoglobin 03/03/2015 14.4  13.0 - 17.0 g/dL Final  . HCT 03/03/2015 44.3  39.0 - 52.0 % Final  . MCV 03/03/2015 91.3  78.0 - 100.0 fL Final  . MCH 03/03/2015 29.7  26.0 - 34.0 pg Final  . MCHC 03/03/2015 32.5  30.0 - 36.0 g/dL Final  . RDW 03/03/2015 15.0  11.5 - 15.5 % Final  . Platelets 03/03/2015 281  150 - 400 K/uL Final  . MPV 03/03/2015 10.2  8.6 - 12.4 fL Final  . Neutrophils Relative % 03/03/2015  53  43 - 77 % Final  . Neutro Abs 03/03/2015 3.1  1.7 - 7.7 K/uL Final  . Lymphocytes Relative 03/03/2015 35  12 - 46 % Final  . Lymphs Abs 03/03/2015 2.0  0.7 - 4.0 K/uL Final  . Monocytes Relative 03/03/2015 10  3 - 12 % Final  . Monocytes Absolute 03/03/2015 0.6  0.1 - 1.0 K/uL Final  . Eosinophils Relative 03/03/2015 2  0 - 5 % Final  . Eosinophils Absolute 03/03/2015 0.1  0.0 - 0.7 K/uL Final  . Basophils Relative 03/03/2015 0  0 - 1 % Final  . Basophils Absolute 03/03/2015 0.0  0.0 - 0.1 K/uL Final  . Smear Review 03/03/2015 Criteria for review not met   Final  . Hgb A1c MFr Bld 03/03/2015 5.7* <5.7 % Final   Comment:  According to the ADA Clinical Practice Recommendations for 2011, when HbA1c is used as a screening test:     >=6.5%   Diagnostic of Diabetes Mellitus            (if abnormal result is confirmed)   5.7-6.4%   Increased risk of developing Diabetes Mellitus   References:Diagnosis and Classification of Diabetes Mellitus,Diabetes FYBO,1751,02(HENID 1):S62-S69 and Standards of Medical Care in         Diabetes - 2011,Diabetes POEU,2353,61 (Suppl 1):S11-S61.     . Mean Plasma Glucose 03/03/2015 117* <117 mg/dL Final   Past Medical History  Diagnosis Date  . 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 Hypercholesteremia   . Hypertension   . Metabolic syndrome   . Arthritis   . Headache(784.0)   . Wrist pain   . Swelling     both legs  . Borderline hyperglycemia   . Sleep apnea     uses C PAP   Past Surgical History  Procedure Laterality Date  . Colonoscopy N/A 03/02/2013    Procedure: COLONOSCOPY;  Surgeon: Beryle Beams, MD;  Location: [facility];  Service: Endoscopy;  Laterality: N/A;  . Breath tek h pylori N/A 08/08/2014    Procedure: BREATH TEK H PYLORI;  Surgeon: Edward Jolly, MD;  Location: [facility];  Service: General;  Laterality: N/A;  . Knee arthroscopy  2001      rt knee  . Appendectomy  1986  . 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 Upper gi endoscopy  11/05/2014    Procedure: UPPER GI ENDOSCOPY;  Surgeon: Excell Seltzer, MD;  Location: [facility];  Service: General;;   Current Outpatient Prescriptions on File Prior to Visit  Medication Sig Dispense Refill  . amLODipine-benazepril (LOTREL) 5-20 MG per capsule TAKE (2) CAPSULES BY MOUTH ONCE EVERY MORNING. 60 capsule 3  . loratadine (CLARITIN) 10 MG tablet Take 10 mg by mouth every morning.    . metoprolol succinate (TOPROL-XL) 25 MG 24 hr tablet TAKE 1 TABLET BY MOUTH ONCE A DAY. 30 tablet 5  . Multiple Vitamins-Minerals (MULTIVITAMIN WITH MINERALS) tablet Take 1 tablet by mouth every morning.     . pravastatin (PRAVACHOL) 40 MG tablet TAKE (1) TABLET BY MOUTH ONCE DAILY. 30 tablet 3  . SUMAtriptan (IMITREX) 50 MG tablet May repeat in 2 hours if headache persists or recurs. 10 tablet 3   No current facility-administered medications on file prior to visit.   Allergies  Allergen Reactions  . Penicillins Rash   History   Social History  . Marital Status: Married    Spouse Name: N/A  . Number of Children: N/A  . Years of Education: N/A   Occupational History  . Not on file.   Social History Main Topics  . Smoking status: Never Smoker   . Smokeless tobacco: Never Used  . Alcohol Use: No  . Drug Use: No  . Sexual Activity: Yes   Other Topics Concern  . Not on file   Social History Narrative     Review of Systems  All other systems reviewed and are negative.      Objective:   Physical Exam  Constitutional: He appears well-developed and well-nourished.  Cardiovascular: Normal rate, regular rhythm and normal heart sounds.   No murmur heard. Pulmonary/Chest: Effort normal and breath sounds normal. No respiratory  distress. He  has no wheezes. He has no rales.  Abdominal: Soft. Bowel sounds are normal. He exhibits no distension. There is no tenderness. There is no rebound and no guarding.  Musculoskeletal: He exhibits no edema.  Vitals reviewed.         Assessment & Plan:  Essential hypertension  Hypercholesteremia  Patient's blood pressure is outstanding. I recommended that he discontinue Toprol-XL. I anticipate that if he continues to lose weight in 6 months with may also start to cut back on his Lotrel. I also recommended he decrease pravastatin to 20 mg a day and recheck lab work in 6 months. If his cholesterol continues to fall after he loses weight, we will discontinue pravastatin altogether. I am extremely proud of this patient.

## 2015-03-25 ENCOUNTER — Other Ambulatory Visit: Payer: Self-pay | Admitting: Family Medicine

## 2015-04-02 ENCOUNTER — Ambulatory Visit: Payer: Federal, State, Local not specified - PPO | Admitting: Dietician

## 2015-04-08 ENCOUNTER — Encounter: Payer: Federal, State, Local not specified - PPO | Attending: General Surgery | Admitting: Dietician

## 2015-04-08 DIAGNOSIS — Z713 Dietary counseling and surveillance: Secondary | ICD-10-CM | POA: Insufficient documentation

## 2015-04-08 DIAGNOSIS — Z6841 Body Mass Index (BMI) 40.0 and over, adult: Secondary | ICD-10-CM | POA: Diagnosis not present

## 2015-04-08 NOTE — Patient Instructions (Signed)
  Surgery date: 11/05/2014 Surgery type: RYGB Start weight at [facility]: 344.5 on 07/25/14 (356 lbs per patient) Weight today: 232 lbs Weight change: 46.5 lbs Total weight lost: 124 lbs  TANITA  BODY COMP RESULTS  pre op  11/19/14 01/01/15 04/08/15   BMI (kg/m^2) 56 50.9 46.3 38.6   Fat Mass (lbs) 175.5 152.5 114.5 63.5   Fat Free Mass (lbs) 161 153.5 164 168.5   Total Body Water (lbs) 118 112.5 120 123.5

## 2015-04-08 NOTE — Progress Notes (Signed)
  Follow-up visit:  5 months Post-Operative RYGB Surgery  Medical Nutrition Therapy:  Appt start time: 310 end time:  330  Primary concerns today: Post-operative Bariatric Surgery Nutrition Management.  Brian Newton returns today having lost another 46.5 lbs. He reports feeling great and excited about his weight loss. He is now able to comfortably bend over and tie his shoes. Still logging blood pressure and foods. No longer taking one of his BP medications and taking 1/2 cholesterol medication. He has added some carbs like hot dog buns occasionally. No longer exercising but more active overall. Also no longer wearing CPAP because it dries his mouth out. He reports his PCP advised him to discontinue use until it is appropriate that he has another sleep study.   Surgery date: 11/05/2014 Surgery type: RYGB Start weight at [facility]: 344.5 on 07/25/14 (356 lbs per patient) Weight today: 232 lbs Weight change: 46.5 lbs Total weight lost: 124 lbs  TANITA  BODY COMP RESULTS  pre op  11/19/14 01/01/15 04/08/15   BMI (kg/m^2) 56 50.9 46.3 38.6   Fat Mass (lbs) 175.5 152.5 114.5 63.5   Fat Free Mass (lbs) 161 153.5 164 168.5   Total Body Water (lbs) 118 112.5 120 123.5    Preferred Learning Style:   No preference indicated   Learning Readiness:   Ready  24-hr recall: B (3AM): premier protein shake (30g) Snk (7AM): [place] yogurt (12g)  L 11AM): Cheese stick wrapped in ham or [place] (12g) Snk (PM):  nuts D (7PM): 3-4 oz meat (21-28g) Snk (PM):  Fluid intake: water (100+ oz) Estimated total protein intake: 80+ g per day  Medications: off 1 BP pill and 1/2 cholesterol Supplementation: taking  Drinking while eating: no Hair loss: unknown Carbonated beverages: no N/V/D/C: some nausea after eating too much or too fast; large, hard bowel movements Dumping syndrome: none  Recent physical activity:  Stationary bike and upper body weights every morning  Progress Towards Goal(s):  In  progress.    Nutritional Diagnosis:  [place]-3.3 Overweight/obesity related to past poor dietary habits and physical inactivity as evidenced by patient w/ recent RYGB surgery following dietary guidelines for continued weight loss.     Intervention:  Nutrition counseling provided.  Teaching Method Utilized:  Visual Auditory  Barriers to learning/adherence to lifestyle change: none  Demonstrated degree of understanding via:  Teach Back   Monitoring/Evaluation:  Dietary intake, exercise, and body weight. Follow up in 6 months for 11 month post-op visit.

## 2015-06-16 ENCOUNTER — Other Ambulatory Visit: Payer: Self-pay

## 2015-07-17 ENCOUNTER — Other Ambulatory Visit: Payer: Self-pay | Admitting: Family Medicine

## 2015-07-18 NOTE — Telephone Encounter (Signed)
Medication refilled per protocol. 

## 2015-08-21 IMAGING — RF DG UGI W/ GASTROGRAFIN
8 series · 8 of 8 positions shown · non-contrast
Comparison: None.

CLINICAL DATA: Post gastric bypass

EXAM:
UPPER GI SERIES WITH KUB
TECHNIQUE: After obtaining a scout radiograph a routine upper GI series was
performed using thin barium
FLUOROSCOPY TIME:  53 seconds

[Series 1: run · 1 of 1 slices shown (1 of 7)]
[im 1/1]
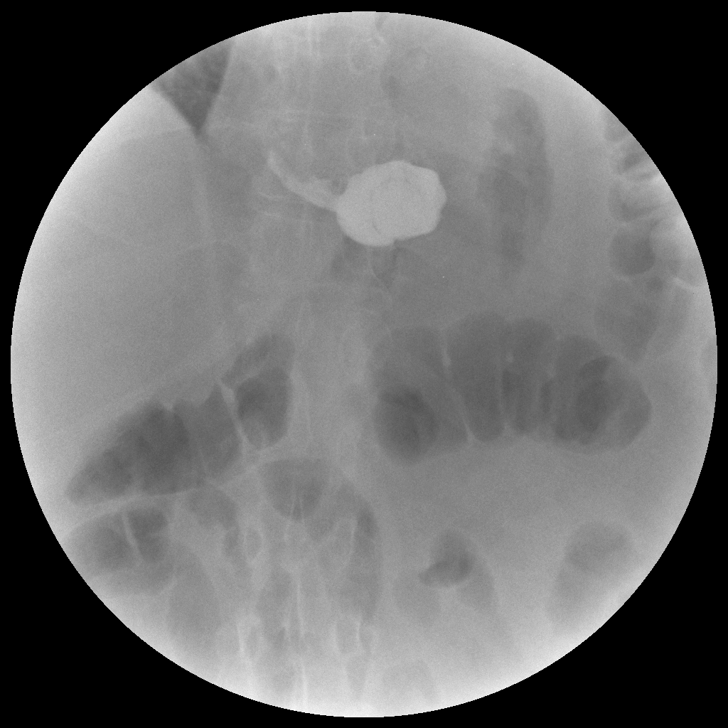

[Series 2: run · 1 of 1 slices shown (2 of 7)]
[im 1/1]
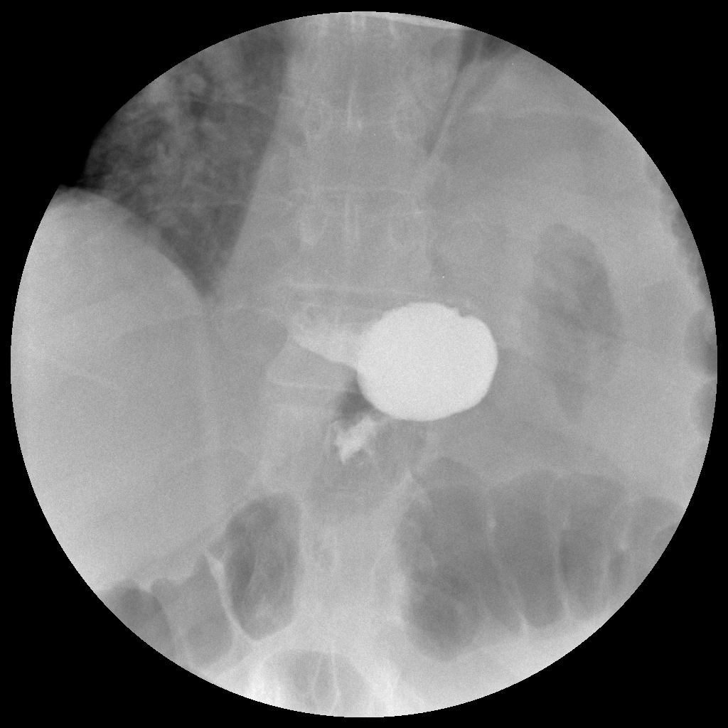

[Series 3: run · 1 of 1 slices shown (3 of 7)]
[im 1/1]
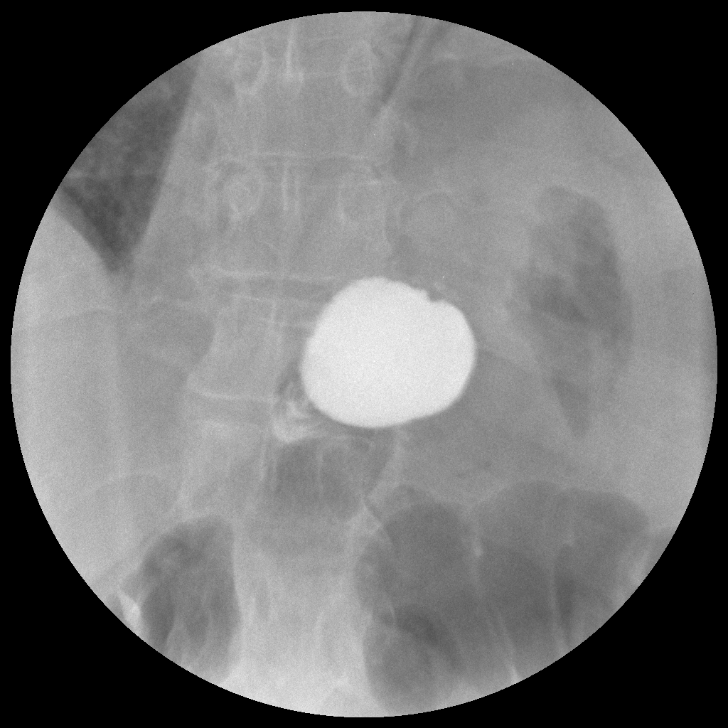

[Series 4: run · 1 of 1 slices shown (4 of 7)]
[im 1/1]
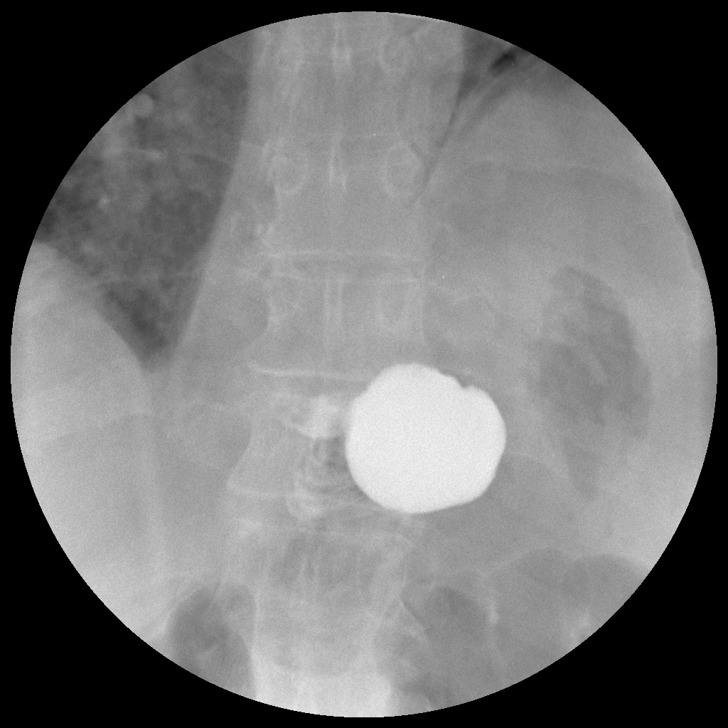

[Series 5: run · 1 of 1 slices shown (5 of 7)]
[im 1/1]
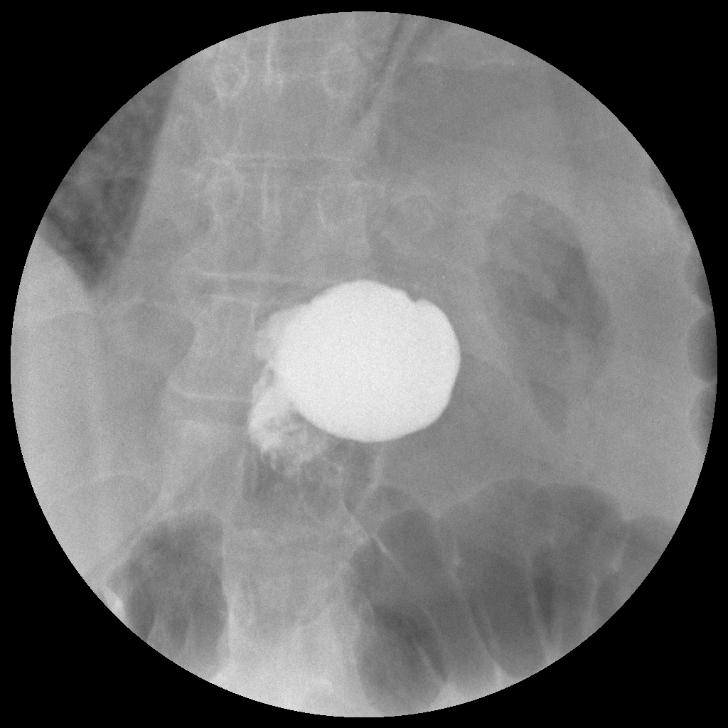

[Series 6: run · 1 of 1 slices shown (6 of 7)]
[im 1/1]
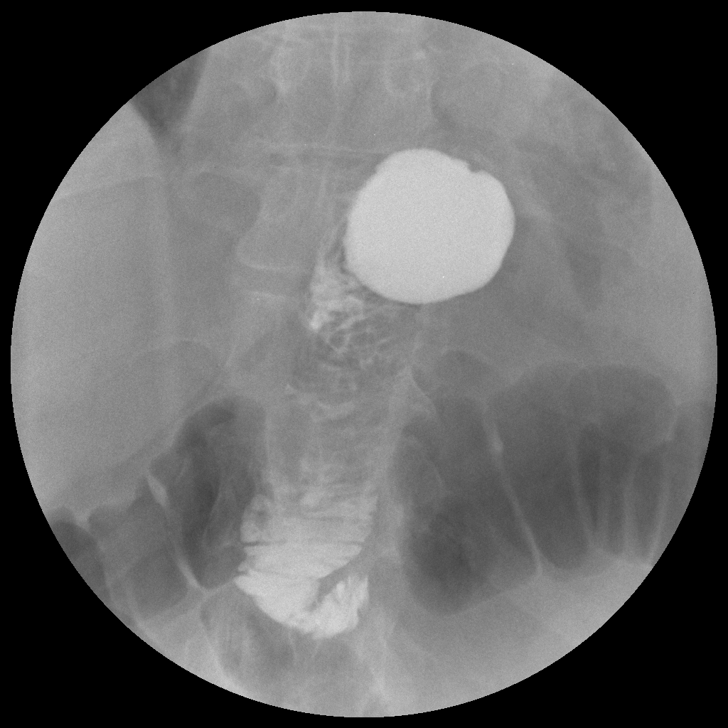

[Series 7: run · 1 of 1 slices shown (7 of 7)]
[im 1/1]
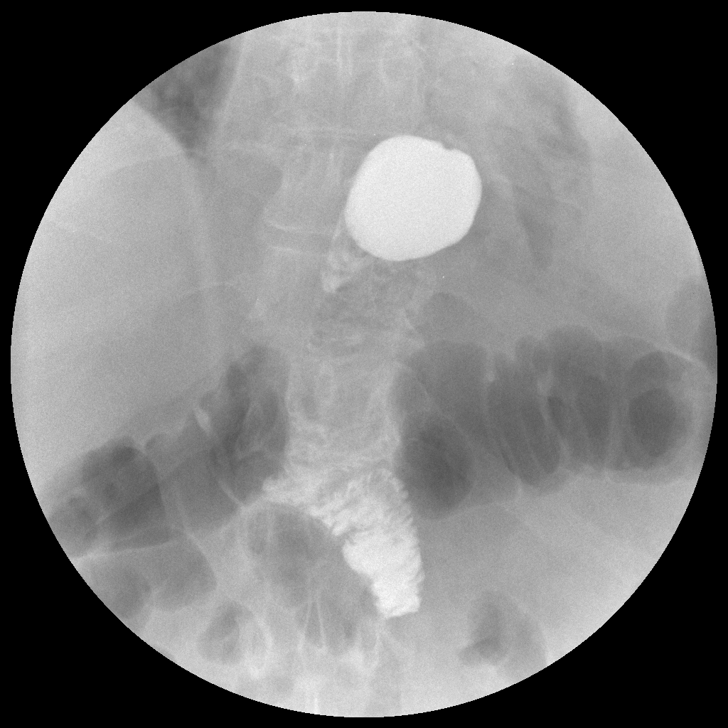

[Series 1001: view not recorded · 0.20mm/px · 1 of 1 slices shown]
[im 1/1]
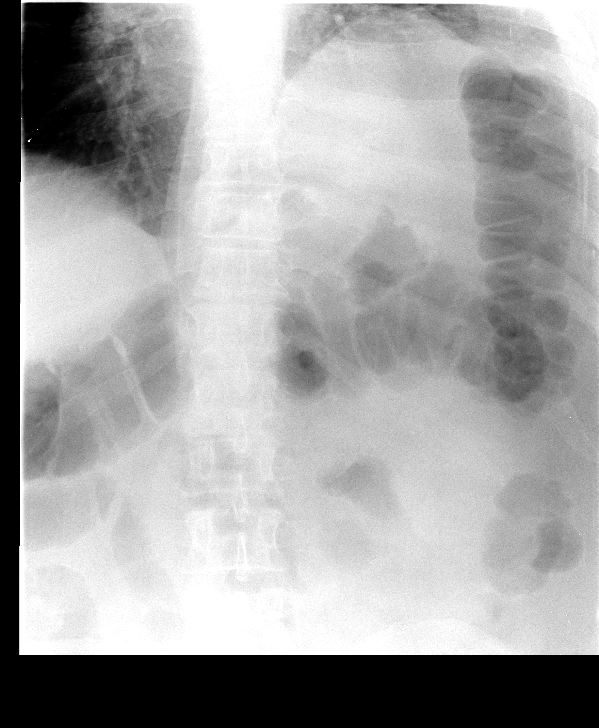

[8 of 8 positions shown; findings below may reference images not displayed]

FINDINGS: Scout radiograph demonstrates surgical sutures in the left upper
abdomen.

Normal gastric pouch.

No evidence of leak.

After a brief delay, contrast opacifies the efferent limb.

Reflux of contrast into the distal esophagus.
IMPRESSION: No evidence of leak.

Delayed emptying of contrast into the efferent limb, suggesting
edema at the gastrojejunostomy.

Gastroesophageal reflux.

## 2015-09-23 ENCOUNTER — Encounter: Payer: Self-pay | Admitting: Family Medicine

## 2015-09-23 ENCOUNTER — Ambulatory Visit (INDEPENDENT_AMBULATORY_CARE_PROVIDER_SITE_OTHER): Payer: Federal, State, Local not specified - PPO | Admitting: Family Medicine

## 2015-09-23 VITALS — BP 104/74 | HR 76 | Temp 98.1°F | Resp 18 | Wt 215.0 lb

## 2015-09-23 DIAGNOSIS — B029 Zoster without complications: Secondary | ICD-10-CM

## 2015-09-23 MED ORDER — OXYCODONE-ACETAMINOPHEN 5-325 MG PO TABS: 1.0000 | ORAL_TABLET | Freq: Three times a day (TID) | ORAL | Status: DC | PRN

## 2015-09-23 MED ORDER — VALACYCLOVIR HCL 1 G PO TABS: 1000.0000 mg | ORAL_TABLET | Freq: Three times a day (TID) | ORAL | Status: DC

## 2015-09-23 NOTE — Progress Notes (Signed)
   Subjective:    Patient ID: Brian Newton, male    DOB: 1966-06-19, 49 y.o.   MRN: [id number]  HPI Patient reports a painful rash on his right gluteus spreading down his posterior right thigh in a dermatomal distribution. The rash consists of erythematous clustered vesicles and papules following the L5 dermatome. The rash hurts and burns. Past Medical History  Diagnosis Date  . 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 Hypercholesteremia   . Hypertension   . Metabolic syndrome   . Arthritis   . Headache(784.0)   . Wrist pain   . Swelling     both legs  . Borderline hyperglycemia   . Sleep apnea     uses C PAP   Current Outpatient Prescriptions on File Prior to Visit  Medication Sig Dispense Refill  . amLODipine-benazepril (LOTREL) 5-20 MG per capsule TAKE (2) CAPSULES BY MOUTH ONCE EVERY MORNING. 60 capsule 5  . Cyanocobalamin 500 MCG/0.1ML SOLN Place 500 mcg into the nose once a week.    . loratadine (CLARITIN) 10 MG tablet Take 10 mg by mouth every morning.    . metoprolol succinate (TOPROL-XL) 25 MG 24 hr tablet TAKE 1 TABLET BY MOUTH ONCE A DAY. 30 tablet 5  . Multiple Vitamins-Minerals (MULTIVITAMIN WITH MINERALS) tablet Take 1 tablet by mouth every morning.     Marland Kitchen OVER THE COUNTER MEDICATION Calcium - 600mg  - vitamin d 1500units - magnesium - 120mg  - 2 tablets bid    . OVER THE COUNTER MEDICATION Iron - 54mg  - vitamn c 90mg  - 1 qam - 2 pm    . pravastatin (PRAVACHOL) 40 MG tablet TAKE (1) TABLET BY MOUTH ONCE DAILY. 90 tablet 1  . SUMAtriptan (IMITREX) 50 MG tablet May repeat in 2 hours if headache persists or recurs. 10 tablet 3   No current facility-administered medications on file prior to visit.   Allergies  Allergen Reactions  . Penicillins Rash   Social History   Social History  . Marital Status: Married    Spouse Name: N/A  . Number of Children: N/A  . Years of Education: N/A   Occupational History  . Not on file.   Social History Main  Topics  . Smoking status: Never Smoker   . Smokeless tobacco: Never Used  . Alcohol Use: No  . Drug Use: No  . Sexual Activity: Yes   Other Topics Concern  . Not on file   Social History Narrative      Review of Systems  All other systems reviewed and are negative.      Objective:   Physical Exam  Cardiovascular: Normal rate, regular rhythm and normal heart sounds.   Pulmonary/Chest: Effort normal and breath sounds normal.  Skin: Rash noted. There is erythema.  Vitals reviewed.         Assessment & Plan:  Shingles - Plan: valACYclovir (VALTREX) 1000 MG tablet, oxyCODONE-acetaminophen (ROXICET) 5-325 MG tablet  patient has shingles. Use Valtrex 1 g by mouth 3 times a day for 7 days. He can use Percocet 1-2 tablets every 8 hours as needed for pain

## 2015-09-24 ENCOUNTER — Other Ambulatory Visit: Payer: Federal, State, Local not specified - PPO

## 2015-09-24 DIAGNOSIS — Z79899 Other long term (current) drug therapy: Secondary | ICD-10-CM

## 2015-09-24 DIAGNOSIS — E250 Congenital adrenogenital disorders associated with enzyme deficiency: Secondary | ICD-10-CM

## 2015-09-24 DIAGNOSIS — I10 Essential (primary) hypertension: Secondary | ICD-10-CM

## 2015-09-24 DIAGNOSIS — E785 Hyperlipidemia, unspecified: Secondary | ICD-10-CM

## 2015-09-24 DIAGNOSIS — Z125 Encounter for screening for malignant neoplasm of prostate: Secondary | ICD-10-CM

## 2015-09-24 DIAGNOSIS — E8881 Metabolic syndrome: Secondary | ICD-10-CM

## 2015-09-24 LAB — COMPLETE METABOLIC PANEL WITH GFR
ALBUMIN: 3.8 g/dL (ref 3.6–5.1)
ALT: 20 U/L (ref 9–46)
AST: 20 U/L (ref 10–35)
Alkaline Phosphatase: 74 U/L (ref 40–115)
BILIRUBIN TOTAL: 0.5 mg/dL (ref 0.2–1.2)
BUN: 18 mg/dL (ref 7–25)
CALCIUM: 8.5 mg/dL — AB (ref 8.6–10.3)
CO2: 28 mmol/L (ref 20–31)
Chloride: 105 mmol/L (ref 98–110)
Creat: 0.79 mg/dL (ref 0.70–1.33)
GFR, Est African American: >89 mL/min (ref >=60)
GFR, Est Non African American: >89 mL/min (ref >=60)
Glucose, Bld: 85 mg/dL (ref 70–99)
Potassium: 4.1 mmol/L (ref 3.5–5.3)
Sodium: 138 mmol/L (ref 135–146)
Total Protein: 5.7 g/dL — ABNORMAL LOW (ref 6.1–8.1)

## 2015-09-24 LAB — CBC WITH DIFFERENTIAL/PLATELET
BASOS ABS: 0 10*3/uL (ref 0.0–0.1)
BASOS PCT: 0 % (ref 0–1)
EOS ABS: 0.2 10*3/uL (ref 0.0–0.7)
Eosinophils Relative: 4 % (ref 0–5)
HCT: 44.8 % (ref 39.0–52.0)
Hemoglobin: 14.6 g/dL (ref 13.0–17.0)
LYMPHS ABS: 2.3 10*3/uL (ref 0.7–4.0)
Lymphocytes Relative: 40 % (ref 12–46)
MCH: 30.1 pg (ref 26.0–34.0)
MCHC: 32.6 g/dL (ref 30.0–36.0)
MCV: 92.4 fL (ref 78.0–100.0)
MPV: 9.9 fL (ref 8.6–12.4)
Monocytes Absolute: 0.6 10*3/uL (ref 0.1–1.0)
Monocytes Relative: 10 % (ref 3–12)
Neutro Abs: 2.7 10*3/uL (ref 1.7–7.7)
Neutrophils Relative %: 46 % (ref 43–77)
Platelets: 275 10*3/uL (ref 150–400)
RBC: 4.85 MIL/uL (ref 4.22–5.81)
RDW: 13.9 % (ref 11.5–15.5)
WBC: 5.8 10*3/uL (ref 4.0–10.5)

## 2015-09-24 LAB — LIPID PANEL
Cholesterol: 120 mg/dL — ABNORMAL LOW (ref 125–200)
HDL: 41 mg/dL (ref >=40)
LDL Cholesterol: 64 mg/dL (ref <130)
Total CHOL/HDL Ratio: 2.9 Ratio (ref <=5.0)
Triglycerides: 76 mg/dL (ref <150)
VLDL: 15 mg/dL (ref <30)

## 2015-09-24 LAB — TSH: TSH: 0.86 u[IU]/mL (ref 0.350–4.500)

## 2015-09-25 ENCOUNTER — Ambulatory Visit: Payer: Federal, State, Local not specified - PPO | Admitting: Family Medicine

## 2015-09-25 LAB — HEMOGLOBIN A1C
HEMOGLOBIN A1C: 5.5 % (ref <5.7)
Mean Plasma Glucose: 111 mg/dL (ref <117)

## 2015-09-25 LAB — TESTOSTERONE: TESTOSTERONE: 372 ng/dL (ref 300–890)

## 2015-09-25 LAB — PSA: PSA: 0.44 ng/mL (ref <=4.00)

## 2015-10-02 ENCOUNTER — Encounter: Payer: Self-pay | Admitting: Family Medicine

## 2015-10-02 ENCOUNTER — Ambulatory Visit (INDEPENDENT_AMBULATORY_CARE_PROVIDER_SITE_OTHER): Payer: Federal, State, Local not specified - PPO | Admitting: Family Medicine

## 2015-10-02 VITALS — BP 132/68 | HR 72 | Temp 98.0°F | Resp 14 | Ht 65.0 in | Wt 210.0 lb

## 2015-10-02 DIAGNOSIS — Z23 Encounter for immunization: Secondary | ICD-10-CM | POA: Diagnosis not present

## 2015-10-02 DIAGNOSIS — G4733 Obstructive sleep apnea (adult) (pediatric): Secondary | ICD-10-CM

## 2015-10-02 DIAGNOSIS — E8881 Metabolic syndrome: Secondary | ICD-10-CM | POA: Diagnosis not present

## 2015-10-02 DIAGNOSIS — I10 Essential (primary) hypertension: Secondary | ICD-10-CM | POA: Diagnosis not present

## 2015-10-02 DIAGNOSIS — E291 Testicular hypofunction: Secondary | ICD-10-CM

## 2015-10-02 DIAGNOSIS — E7800 Pure hypercholesterolemia, unspecified: Secondary | ICD-10-CM

## 2015-10-02 DIAGNOSIS — Z9989 Dependence on other enabling machines and devices: Secondary | ICD-10-CM

## 2015-10-02 NOTE — Progress Notes (Signed)
Subjective:    Patient ID: Brian Newton, male    DOB: 1966/10/22, 49 y.o.   MRN: [id number]  HPI 09/23/15 Patient reports a painful rash on his right gluteus spreading down his posterior right thigh in a dermatomal distribution. The rash consists of erythematous clustered vesicles and papules following the L5 dermatome. The rash hurts and burns.  At that time, my plan was: patient has shingles. Use Valtrex 1 g by mouth 3 times a day for 7 days. He can use Percocet 1-2 tablets every 8 hours as needed for pain  10/02/15 Patient's zoster rash is slowly starting to improve. His pain is much better. His blood pressures well controlled today. He denies any chest pain shortness of breath or dyspnea on exertion. He has lost over 100 pounds after his gastric bypass surgery. He is not sure that he needs CPAP anymore. He does not snore. He does not stop breathing at night. He does not feel as tired during the day. He has not been compliant wearing his CPAP. His cholesterol is excellent even though he decrease the dose of pravastatin 50%. Otherwise he is doing well with no concerns Past Medical History  Diagnosis Date  . 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 Hypercholesteremia   . Hypertension   . Metabolic syndrome   . Arthritis   . Headache(784.0)   . Wrist pain   . Swelling     both legs  . Borderline hyperglycemia   . Sleep apnea     uses C PAP   Current Outpatient Prescriptions on File Prior to Visit  Medication Sig Dispense Refill  . amLODipine-benazepril (LOTREL) 5-20 MG per capsule TAKE (2) CAPSULES BY MOUTH ONCE EVERY MORNING. 60 capsule 5  . Cyanocobalamin 500 MCG/0.1ML SOLN Place 500 mcg into the nose once a week.    . loratadine (CLARITIN) 10 MG tablet Take 10 mg by mouth every morning.    . metoprolol succinate (TOPROL-XL) 25 MG 24 hr tablet TAKE 1 TABLET BY MOUTH ONCE A DAY. 30 tablet 5  . Multiple Vitamins-Minerals (MULTIVITAMIN WITH MINERALS) tablet Take 1  tablet by mouth every morning.     Marland Kitchen OVER THE COUNTER MEDICATION Calcium - 649m - vitamin d 1500units - magnesium - 1229m- 2 tablets bid    . OVER THE COUNTER MEDICATION Iron - 5427m vitamn c 6m50m1 qam - 2 pm    . oxyCODONE-acetaminophen (ROXICET) 5-325 MG tablet Take 1-2 tablets by mouth every 8 (eight) hours as needed for severe pain. 40 tablet 0  . pravastatin (PRAVACHOL) 40 MG tablet TAKE (1) TABLET BY MOUTH ONCE DAILY. 90 tablet 1  . SUMAtriptan (IMITREX) 50 MG tablet May repeat in 2 hours if headache persists or recurs. 10 tablet 3  . valACYclovir (VALTREX) 1000 MG tablet Take 1 tablet (1,000 mg total) by mouth 3 (three) times daily. 21 tablet 0   No current facility-administered medications on file prior to visit.   Allergies  Allergen Reactions  . Penicillins Rash   Social History   Social History  . Marital Status: Married    Spouse Name: N/A  . Number of Children: N/A  . Years of Education: N/A   Occupational History  . Not on file.   Social History Main Topics  . Smoking status: Never Smoker   . Smokeless tobacco: Never Used  . Alcohol Use: No  . Drug Use: No  . Sexual Activity: Yes   Other Topics Concern  .  Not on file   Social History Narrative      Review of Systems  All other systems reviewed and are negative.      Objective:   Physical Exam  Cardiovascular: Normal rate, regular rhythm and normal heart sounds.   Pulmonary/Chest: Effort normal and breath sounds normal.  Skin: Rash noted. There is erythema.  Vitals reviewed.  Lab on 09/24/2015  Component Date Value Ref Range Status  . Sodium 09/24/2015 138  135 - 146 mmol/L Final  . Potassium 09/24/2015 4.1  3.5 - 5.3 mmol/L Final  . Chloride 09/24/2015 105  98 - 110 mmol/L Final  . CO2 09/24/2015 28  20 - 31 mmol/L Final  . Glucose, Bld 09/24/2015 85  70 - 99 mg/dL Final  . BUN 09/24/2015 18  7 - 25 mg/dL Final  . Creat 09/24/2015 0.79  0.70 - 1.33 mg/dL Final  . Total Bilirubin  09/24/2015 0.5  0.2 - 1.2 mg/dL Final  . Alkaline Phosphatase 09/24/2015 74  40 - 115 U/L Final  . AST 09/24/2015 20  10 - 35 U/L Final  . ALT 09/24/2015 20  9 - 46 U/L Final  . Total Protein 09/24/2015 5.7* 6.1 - 8.1 g/dL Final  . Albumin 09/24/2015 3.8  3.6 - 5.1 g/dL Final  . Calcium 09/24/2015 8.5* 8.6 - 10.3 mg/dL Final  . GFR, Est African American 09/24/2015 >89  >=60 mL/min Final  . GFR, Est Non African American 09/24/2015 >89  >=60 mL/min Final   Comment:   The estimated GFR is a calculation valid for adults (>=37 years old) that uses the CKD-EPI algorithm to adjust for age and sex. It is   not to be used for children, pregnant women, hospitalized patients,    patients on dialysis, or with rapidly changing kidney function. According to the NKDEP, eGFR >89 is normal, 60-89 shows mild impairment, 30-59 shows moderate impairment, 15-29 shows severe impairment and <15 is ESRD.     Marland Kitchen TSH 09/24/2015 0.860  0.350 - 4.500 uIU/mL Final  . Cholesterol 09/24/2015 120* 125 - 200 mg/dL Final  . Triglycerides 09/24/2015 76  <150 mg/dL Final  . HDL 09/24/2015 41  >=40 mg/dL Final  . Total CHOL/HDL Ratio 09/24/2015 2.9  <=5.0 Ratio Final  . VLDL 09/24/2015 15  <30 mg/dL Final  . LDL Cholesterol 09/24/2015 64  <130 mg/dL Final   Comment:   Total Cholesterol/HDL Ratio:CHD Risk                        Coronary Heart Disease Risk Table                                        Men       Women          1/2 Average Risk              3.4        3.3              Average Risk              5.0        4.4           2X Average Risk              9.6        7.1  3X Average Risk             23.4       11.0 Use the calculated Patient Ratio above and the CHD Risk table  to determine the patient's CHD Risk.   . WBC 09/24/2015 5.8  4.0 - 10.5 K/uL Final  . RBC 09/24/2015 4.85  4.22 - 5.81 MIL/uL Final  . Hemoglobin 09/24/2015 14.6  13.0 - 17.0 g/dL Final  . HCT 09/24/2015 44.8  39.0 - 52.0 % Final    . MCV 09/24/2015 92.4  78.0 - 100.0 fL Final  . MCH 09/24/2015 30.1  26.0 - 34.0 pg Final  . MCHC 09/24/2015 32.6  30.0 - 36.0 g/dL Final  . RDW 09/24/2015 13.9  11.5 - 15.5 % Final  . Platelets 09/24/2015 275  150 - 400 K/uL Final  . MPV 09/24/2015 9.9  8.6 - 12.4 fL Final  . Neutrophils Relative % 09/24/2015 46  43 - 77 % Final  . Neutro Abs 09/24/2015 2.7  1.7 - 7.7 K/uL Final  . Lymphocytes Relative 09/24/2015 40  12 - 46 % Final  . Lymphs Abs 09/24/2015 2.3  0.7 - 4.0 K/uL Final  . Monocytes Relative 09/24/2015 10  3 - 12 % Final  . Monocytes Absolute 09/24/2015 0.6  0.1 - 1.0 K/uL Final  . Eosinophils Relative 09/24/2015 4  0 - 5 % Final  . Eosinophils Absolute 09/24/2015 0.2  0.0 - 0.7 K/uL Final  . Basophils Relative 09/24/2015 0  0 - 1 % Final  . Basophils Absolute 09/24/2015 0.0  0.0 - 0.1 K/uL Final  . Smear Review 09/24/2015 Criteria for review not met   Final  . Hgb A1c MFr Bld 09/24/2015 5.5  <5.7 % Final   Comment:                                                                        According to the ADA Clinical Practice Recommendations for 2011, when HbA1c is used as a screening test:     >=6.5%   Diagnostic of Diabetes Mellitus            (if abnormal result is confirmed)   5.7-6.4%   Increased risk of developing Diabetes Mellitus   References:Diagnosis and Classification of Diabetes Mellitus,Diabetes ZOXW,9604,54(UJWJX 1):S62-S69 and Standards of Medical Care in         Diabetes - 2011,Diabetes BJYN,8295,62 (Suppl 1):S11-S61.     . Mean Plasma Glucose 09/24/2015 111  <117 mg/dL Final  . Testosterone 09/24/2015 372  300 - 890 ng/dL Final   Comment:           Tanner Stage       Male              Male               I              < 30 ng/dL        < 10 ng/dL               II             < 150 ng/dL       < 30 ng/dL  III            100-320 ng/dL     < 35 ng/dL               IV             200-970 ng/dL     15-40 ng/dL               V/Adult         300-890 ng/dL     10-70 ng/dL     . PSA 09/24/2015 0.44  <=4.00 ng/mL Final   Comment: Test Methodology: ECLIA PSA (Electrochemiluminescence Immunoassay)   For PSA values from 2.5-4.0, particularly in younger men <55 years old, the AUA and NCCN suggest testing for % Free PSA (3515) and evaluation of the rate of increase in PSA (PSA velocity).           Assessment & Plan:  Essential hypertension  Hypercholesteremia  Metabolic syndrome  Hypogonadism male  OSA on CPAP - Plan: Ambulatory referral to Sleep Studies  Cholesterol is outstanding. Discontinue pravastatin. Blood pressures well controlled. Decreased blood pressure medication to 1 tablet a day instead of 2. Schedule patient for a sleep study. He may not require CPAP anymore. He received his flu shot today. His blood sugar is excellent. I congratulated patient on his substantial weight loss. Testosterone levels are back to normal even without treatment.

## 2015-10-02 NOTE — Addendum Note (Signed)
Addended by: Sheral Flow on: 10/02/2015 10:04 AM   Modules accepted: Orders

## 2015-10-06 ENCOUNTER — Ambulatory Visit (INDEPENDENT_AMBULATORY_CARE_PROVIDER_SITE_OTHER): Payer: Federal, State, Local not specified - PPO | Admitting: Neurology

## 2015-10-06 ENCOUNTER — Encounter: Payer: Self-pay | Admitting: Neurology

## 2015-10-06 VITALS — BP 138/78 | HR 62 | Resp 18 | Ht 65.0 in | Wt 216.0 lb

## 2015-10-06 DIAGNOSIS — G4719 Other hypersomnia: Secondary | ICD-10-CM | POA: Diagnosis not present

## 2015-10-06 DIAGNOSIS — E669 Obesity, unspecified: Secondary | ICD-10-CM

## 2015-10-06 DIAGNOSIS — Z9884 Bariatric surgery status: Secondary | ICD-10-CM | POA: Diagnosis not present

## 2015-10-06 DIAGNOSIS — G4733 Obstructive sleep apnea (adult) (pediatric): Secondary | ICD-10-CM | POA: Diagnosis not present

## 2015-10-06 NOTE — Progress Notes (Signed)
Subjective:    Patient ID: Brian Newton is a 49 y.o. male.  HPI     Star Age, MD, PhD [facility] [address]O. [place], [place] [address]  Dear Dr. Dennard Schaumann,   I saw your patient, Brian Newton, upon your kind request, in my neurologic clinic today for initial consultation of his sleep disorder, in particular, concern for underlying obstructive sleep apnea, for reevaluation thereof area the patient is accompanied by his wife today. As you know, Brian Newton is a 49 year old right-handed gentleman with an underlying medical history of hyperlipidemia, hypertension, arthritis, borderline diabetes and morbid obesity, who was previously diagnosed with obstructive sleep apnea and placed on CPAP therapy. He had weight loss surgery with gastric bypass on 11/05/2014 after which she lost a significant amount of weight. He is not fully compliant with CPAP therapy at this time. I reviewed your office note from 10/02/2015. The patient presented recently with a rash and was diagnosed with shingles.  Prior sleep test results were reviewed from 2006: He had a baseline sleep study on 08/16/2005 08/16/05, which showed a total AHI of 30.6 and an oxyhemoglobin desaturation nadir of 64%, in keeping with severe obstructive sleep apnea. He had a CPAP titration study on 09/27/2005 which showed an AHI of 0.8 per hour on a pressure of 14 cm and oxyhemoglobin desaturation nadir was 87% during that study. He was always compliant with CPAP therapy until March of this year when he started having a lot of dry mouth. His machine is over 51 years old. He has not been using it since March 2016. His BMI at the time of his weight loss surgery was 52.59. He has lost over 100 pounds. He does not drink caffeine, or alcohol, and does not smoke. He works for [employer]. He denies nocturia, RLS Sx or morning HAs. His bedtime is around 7 PM. Rise time varies a little bit depending on how early he  has to go to work. Usual work time is 5 AM to 1:30 PM. His usual rise time is 3 AM. His Epworth sleepiness score is 15 out of 24 today, his fatigue score is 24 out of 63. His biggest sleep-related complaint at this time is daytime sleepiness which is worse in the afternoons.  He has 2 daughters, ages 68 and 68.  His Past Medical History Is Significant For: Past Medical History  Diagnosis Date  . 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 Hypercholesteremia   . Hypertension   . Metabolic syndrome   . Arthritis   . Headache(784.0)   . Wrist pain   . Swelling     both legs  . Borderline hyperglycemia   . Sleep apnea     uses C PAP  . Shingles     His Past Surgical History Is Significant For: Past Surgical History  Procedure Laterality Date  . Colonoscopy N/A 03/02/2013    Procedure: COLONOSCOPY;  Surgeon: Beryle Beams, MD;  Location: [facility];  Service: Endoscopy;  Laterality: N/A;  . Breath tek h pylori N/A 08/08/2014    Procedure: BREATH TEK H PYLORI;  Surgeon: Edward Jolly, MD;  Location: [facility];  Service: General;  Laterality: N/A;  . Knee arthroscopy  2001    rt knee  . Appendectomy  1986  . 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 Upper gi endoscopy  11/05/2014    Procedure: UPPER GI ENDOSCOPY;  Surgeon: Excell Seltzer, MD;  Location: [facility];  Service: General;;    His Family History Is Significant For: Family History  Problem Relation Age of Onset  . Hypertension Other   . Hyperlipidemia Other   . Heart disease Other   . Obesity Other   . Sleep apnea Other   . Hypertension Mother   . Breast cancer Maternal Grandmother   . Colon cancer Maternal Grandfather     His Social History Is Significant For: Social History   Social History  . Marital Status: Married    Spouse Name:  N/A  . Number of Children: 2  . Years of Education: HS   Social History Main Topics  . Smoking status: Never Smoker   . Smokeless tobacco: Never Used  . Alcohol Use: No  . Drug Use: No  . Sexual Activity: Yes   Other Topics Concern  . None   Social History Narrative   Denies caffeine use     His Allergies Are:  Allergies  Allergen Reactions  . Penicillins Rash  :   His Current Medications Are:  Outpatient Encounter Prescriptions as of 10/06/2015  Medication Sig  . amLODipine-benazepril (LOTREL) 5-20 MG per capsule TAKE (2) CAPSULES BY MOUTH ONCE EVERY MORNING.  . Cyanocobalamin 500 MCG/0.1ML SOLN Place 500 mcg into the nose once a week.  . loratadine (CLARITIN) 10 MG tablet Take 10 mg by mouth every morning.  . Multiple Vitamins-Minerals (MULTIVITAMIN WITH MINERALS) tablet Take 1 tablet by mouth every morning.   Marland Kitchen OVER THE COUNTER MEDICATION Calcium - 600mg  - vitamin d 1500units - magnesium - 120mg  - 2 tablets bid  . OVER THE COUNTER MEDICATION Iron - 54mg  - vitamn c 90mg  - 1 qam - 2 pm  . oxyCODONE-acetaminophen (ROXICET) 5-325 MG tablet Take 1-2 tablets by mouth every 8 (eight) hours as needed for severe pain.  . [DISCONTINUED] pravastatin (PRAVACHOL) 40 MG tablet TAKE (1) TABLET BY MOUTH ONCE DAILY. (Patient taking differently: TAKE (1/2) TABLET BY MOUTH ONCE DAILY.)  . [DISCONTINUED] valACYclovir (VALTREX) 1000 MG tablet Take 1 tablet (1,000 mg total) by mouth 3 (three) times daily.   No facility-administered encounter medications on file as of 10/06/2015.  :  Review of Systems:  Out of a complete 14 point review of systems, all are reviewed and negative with the exception of these symptoms as listed below:  Review of Systems  Neurological:       Last sleep study was done 10-15 years ago. Reports that he always used CPAP but stopped in 02/2015 due to losing weight after gastric bypass.   No trouble falling or staying asleep, patient gets up at 2-3 am for work, feels  tired when he gets up, daytime tiredness, occasionally takes naps.     Objective:  Neurologic Exam  Physical Exam Physical Examination:   Filed Vitals:   10/06/15 1457  BP: 138/78  Pulse: 62  Resp: 18    General Examination: The patient is a very pleasant 49 y.o. male in no acute distress. He appears well-developed and well-nourished and well groomed.   HEENT: Normocephalic, atraumatic, pupils are equal, round and reactive to light and accommodation. Funduscopic exam is normal with sharp disc margins noted. Extraocular tracking is good without limitation to gaze excursion or nystagmus noted. Normal smooth pursuit is noted. Hearing is grossly intact. Tympanic membranes are clear bilaterally. Face is symmetric with normal  facial animation and normal facial sensation. Speech is clear with no dysarthria noted. There is no hypophonia. There is no lip, neck/head, jaw or voice tremor. Neck is supple with full range of passive and active motion. There are no carotid bruits on auscultation. Oropharynx exam reveals: mild mouth dryness, good dental hygiene and moderate airway crowding, due to redundant soft palate, large uvula, tonsils of 1+ on the left and 2+ on the right. Mallampati is class II. Tongue protrudes centrally and palate elevates symmetrically. neck circumference is 16 inches. His neck circumference at this time of his sleep study was 19 inches.  there is mild pharyngeal erythema.   Chest: Clear to auscultation without wheezing, rhonchi or crackles noted.  Heart: S1+S2+0, regular and normal without murmurs, rubs or gallops noted.   Abdomen: Soft, non-tender and non-distended with normal bowel sounds appreciated on auscultation.  Extremities: There is trace pitting edema in the distal lower extremities bilaterally. Pedal pulses are intact.  Skin: Warm and dry without trophic changes noted. There are no varicose veins.  Musculoskeletal: exam reveals no obvious joint deformities,  tenderness or joint swelling or erythema, with the exception of mild discomfort in his right knee.   Neurologically:  Mental status: The patient is awake, alert and oriented in all 4 spheres. His immediate and remote memory, attention, language skills and fund of knowledge are appropriate. There is no evidence of aphasia, agnosia, apraxia or anomia. Speech is clear with normal prosody and enunciation. Thought process is linear. Mood is normal and affect is normal.  Cranial nerves II - XII are as described above under HEENT exam. In addition: shoulder shrug is normal with equal shoulder height noted. Motor exam: Normal bulk, strength and tone is noted. There is no drift, tremor or rebound. Romberg is negative. Reflexes are 2+ throughout. Fine motor skills and coordination: intact with normal finger taps, normal hand movements, normal rapid alternating patting, normal foot taps and normal foot agility.  Cerebellar testing: No dysmetria or intention tremor on finger to nose testing. Heel to shin is unremarkable bilaterally. There is no truncal or gait ataxia.  Sensory exam: intact to light touch, pinprick, vibration, temperature sense in the upper and lower extremities.  Gait, station and balance: He stands easily. No veering to one side is noted. No leaning to one side is noted. Posture is age-appropriate and stance is narrow based. Gait shows normal stride length and normal pace. No problems turning are noted. He turns en bloc. Tandem walk is unremarkable.   Assessment and Plan:  In summary, Brian Newton is a very pleasant 49 y.o.-year old male with an underlying medical history of hyperlipidemia, hypertension, arthritis, borderline diabetes and morbid obesity, who was previously diagnosed with obstructive sleep apnea and placed on CPAP therapy.he was compliant with CPAP therapy for 10 years but in the last year he has lost a lot of weight secondary to weight loss surgery. He feels symptoms have  improved but has residual daytime sleepiness and has not had reevaluation of his previous diagnosis of severe obstructive sleep apnea. He may still have some residual obstructive sleep apnea (OSA). I had a long chat with the patient and Olivia Mackie about my findings and the diagnosis of OSA, its prognosis and treatment options. We talked about medical treatments, surgical interventions and non-pharmacological approaches. I explained in particular the risks and ramifications of untreated moderate to severe OSA, especially with respect to developing cardiovascular disease down the Road, including congestive heart failure, difficult to treat hypertension,  cardiac arrhythmias, or stroke. Even type 2 diabetes has, in part, been linked to untreated OSA. Symptoms of untreated OSA include daytime sleepiness, memory problems, mood irritability and mood disorder such as depression and anxiety, lack of energy, as well as recurrent headaches, especially morning headaches. We talked about trying to maintain a healthy lifestyle in general, as well as the importance of weight control. I encouraged the patient to eat healthy, exercise daily and keep well hydrated, to keep a scheduled bedtime and wake time routine, to not skip any meals and eat healthy snacks in between meals. I advised the patient not to drive when feeling sleepy. I recommended the following at this time: sleep study with potential positive airway pressure titration. (We will score hypopneas at 3% and split the sleep study into diagnostic and treatment portion, if the estimated. 2 hour AHI is >15/h).   I explained the sleep test procedure to the patient and also outlined possible surgical and non-surgical treatment options of OSA, including the use of a custom-made dental device (which would require a referral to a specialist dentist or oral surgeon), upper airway surgical options, such as pillar implants, radiofrequency surgery, tongue base surgery, and UPPP (which  would involve a referral to an ENT surgeon). Rarely, jaw surgery such as mandibular advancement may be considered.  I also explained the CPAP treatment option to the patient, who indicated that he would be willing to try CPAP again if the need arises. I explained the importance of being compliant with PAP treatment, not only for insurance purposes but primarily to improve His symptoms, and for the patient's long term health benefit, including to reduce His cardiovascular risks. I answered all their questions today and the patient and his wife were in agreement. I would like to see him back after the sleep study is completed and encouraged him to call with any interim questions, concerns, problems or updates.   Thank you very much for allowing me to participate in the care of this nice patient. If I can be of any further assistance to you please do not hesitate to call me at [phone].  Sincerely,   Star Age, MD, PhD

## 2015-10-06 NOTE — Patient Instructions (Signed)
Based on your symptoms and your exam I believe you may still be at risk for obstructive sleep apnea or OSA, and I think we should proceed with a sleep study to determine whether you do or do not have OSA and how severe it is. If you have more than mild OSA, I want you to consider treatment with CPAP. Please remember, the risks and ramifications of moderate to severe obstructive sleep apnea or OSA are: Cardiovascular disease, including congestive heart failure, stroke, difficult to control hypertension, arrhythmias, and even type 2 diabetes has been linked to untreated OSA. Sleep apnea causes disruption of sleep and sleep deprivation in most cases, which, in turn, can cause recurrent headaches, problems with memory, mood, concentration, focus, and vigilance. Most people with untreated sleep apnea report excessive daytime sleepiness, which can affect their ability to drive. Please do not drive if you feel sleepy.   I will likely see you back after your sleep study to go over the test results and where to go from there. We will call you after your sleep study to advise about the results (most likely, you will hear from Diana, my nurse) and to set up an appointment at the time, as necessary.    Our sleep lab administrative assistant, Dawn will meet with you or call you to schedule your sleep study. If you don't hear back from her by next week please feel free to call her at [phone]. This is her direct line and please leave a message with your phone number to call back if you get the voicemail box. She will call back as soon as possible.   

## 2015-10-07 ENCOUNTER — Other Ambulatory Visit: Payer: Self-pay | Admitting: Family Medicine

## 2015-10-07 NOTE — Telephone Encounter (Signed)
Refill appropriate and filled per protocol. 

## 2015-10-08 ENCOUNTER — Encounter: Payer: Federal, State, Local not specified - PPO | Attending: General Surgery | Admitting: Dietician

## 2015-10-08 ENCOUNTER — Encounter: Payer: Self-pay | Admitting: Dietician

## 2015-10-08 DIAGNOSIS — Z713 Dietary counseling and surveillance: Secondary | ICD-10-CM | POA: Diagnosis not present

## 2015-10-08 DIAGNOSIS — Z6834 Body mass index (BMI) 34.0-34.9, adult: Secondary | ICD-10-CM | POA: Insufficient documentation

## 2015-10-08 NOTE — Progress Notes (Signed)
  Follow-up visit:  11 months Post-Operative RYGB Surgery  Medical Nutrition Therapy:  Appt start time: 800 end time:  815  Primary concerns today: Post-operative Bariatric Surgery Nutrition Management.  Brian Newton returns today having lost another 22 pounds in the last 6 months. He reports he has been stuck between 200-210 lbs for the past few months. He is feeling happy with his weight loss and satisfied with the way he is eating. Starting to actually feel hungry instead of just eating because he knows he has to. Has not had any migraines since surgery. Not using CPAP currently, returning for another sleep study soon. HgbA1c 5.5%.  Surgery date: 11/05/2014 Surgery type: RYGB Start weight at [facility]: 344.5 on 07/25/14 (356 lbs per patient) Weight today: 210 lbs Weight change: 22 lbs Total weight lost: 146 lbs  TANITA  BODY COMP RESULTS  pre op  11/19/14 01/01/15 04/08/15 10/08/15   BMI (kg/m^2) 56 50.9 46.3 38.6 34.9   Fat Mass (lbs) 175.5 152.5 114.5 63.5 58   Fat Free Mass (lbs) 161 153.5 164 168.5 152   Total Body Water (lbs) 118 112.5 120 123.5 111.5    Preferred Learning Style:   No preference indicated   Learning Readiness:   Ready  24-hr recall: B (3AM): premier protein shake and 1-2 deviled eggs (37g) Snk (7AM):  [place] yogurt (12g) L (11AM): Cheese stick wrapped in ham or [place] (14g) Snk (PM):  Salad with hamburger patty (14g) D (7PM): Atkins or EAS [employer]vant[employer] shake (15-17g) Snk (PM):  Fluid intake: water (64+ oz) Estimated total protein intake: 90+ g per day  Medications: off 1 BP pill and no longer on cholesterol meds Supplementation: taking  Drinking while eating: no Hair loss: unknown; 3 diet sodas since surgery Carbonated beverages: no N/V/D/C: some pain after eating too much or too fast; 1 "huge" bowel movement daily Dumping syndrome: none  Recent physical activity:  Stationary bike and upper body weights 2x a week; more active overall  Progress Towards  Goal(s):  In progress.    Nutritional Diagnosis:  [name] Overweight/obesity related to past poor dietary habits and physical inactivity as evidenced by patient w/ recent RYGB surgery following dietary guidelines for continued weight loss.     Intervention:  Nutrition counseling provided.  Teaching Method Utilized:  Visual Auditory  Barriers to learning/adherence to lifestyle change: none  Demonstrated degree of understanding via:  Teach Back   Monitoring/Evaluation:  Dietary intake, exercise, and body weight. Follow up in 6 months for 17 month post-op visit.

## 2015-10-08 NOTE — Patient Instructions (Addendum)
   Surgery date: 11/05/2014 Surgery type: RYGB Start weight at [facility]: 344.5 on 07/25/14 (356 lbs per patient) Weight today: 210 lbs Weight change: 22 lbs Total weight lost: 146 lbs  TANITA  BODY COMP RESULTS  pre op  11/19/14 01/01/15 04/08/15 10/08/15   BMI (kg/m^2) 56 50.9 46.3 38.6 34.9   Fat Mass (lbs) 175.5 152.5 114.5 63.5 58   Fat Free Mass (lbs) 161 153.5 164 168.5 152   Total Body Water (lbs) 118 112.5 120 123.5 111.5

## 2015-11-04 ENCOUNTER — Ambulatory Visit (INDEPENDENT_AMBULATORY_CARE_PROVIDER_SITE_OTHER): Payer: Federal, State, Local not specified - PPO | Admitting: Neurology

## 2015-11-04 DIAGNOSIS — G4720 Circadian rhythm sleep disorder, unspecified type: Secondary | ICD-10-CM

## 2015-11-04 DIAGNOSIS — G479 Sleep disorder, unspecified: Secondary | ICD-10-CM

## 2015-11-04 DIAGNOSIS — G4733 Obstructive sleep apnea (adult) (pediatric): Secondary | ICD-10-CM | POA: Diagnosis not present

## 2015-11-05 NOTE — Sleep Study (Signed)
Please see the scanned sleep study interpretation located in the Procedure tab within the Chart Review section. 

## 2015-11-07 ENCOUNTER — Telehealth: Payer: Self-pay | Admitting: Neurology

## 2015-11-07 DIAGNOSIS — G4733 Obstructive sleep apnea (adult) (pediatric): Secondary | ICD-10-CM

## 2015-11-07 NOTE — Telephone Encounter (Signed)
Patient referred by Dr. Dennard Schaumann, seen by me on 10/06/15, diagnostic PSG on 11/04/15, ins: [employer].    Please call and notify the patient that the recent sleep study did confirm the diagnosis of obstructive sleep apnea. OSA is overall mild, but worth treating to see if he feels better after treatment. He had some desaturations to the mid to lower 80s.  To that end I recommend treatment for this in the form of autoPAP, which means, that we don't have to bring him back for a second sleep study with CPAP, but will let him try an autoPAP machine at home, through a DME company (of his choice, or as per insurance requirement). The DME representative will educate him on how to use the machine, how to put the mask on, etc. I have placed an order in the chart. Please send referral, talk to patient, send report to PCP and referring MD. We will need a FU in sleep clinic for 8 to 10 weeks post-PAP set up, please arrange that as well. Thanks,   Star Age, MD, PhD [facility])

## 2015-11-10 NOTE — Telephone Encounter (Signed)
Left message on home phone to call back for sleep study results.

## 2015-11-10 NOTE — Telephone Encounter (Signed)
I spoke to patient and he is aware of results and recommendation. He would like orders sent to [facility]. I will fax report to PCP. Patient made f/u appt. I will send him a letter reminding him to keep appt and stress the importance of compliance.

## 2015-11-10 NOTE — Telephone Encounter (Signed)
Pt returned call. Please call and advise [phone]

## 2015-11-12 ENCOUNTER — Encounter: Payer: Self-pay | Admitting: Family Medicine

## 2016-01-12 ENCOUNTER — Telehealth: Payer: Self-pay

## 2016-01-12 NOTE — Telephone Encounter (Signed)
I spoke to patient and he is aware to bring CPAP in to appt so we can get recent down load.

## 2016-01-13 ENCOUNTER — Ambulatory Visit: Payer: Self-pay | Admitting: Neurology

## 2016-01-13 ENCOUNTER — Telehealth: Payer: Self-pay

## 2016-01-13 NOTE — Telephone Encounter (Signed)
Left message on both contact number to call us back and reschedule, DR out sick today. He can reschedule with Dr. Rexene Alberts or NP.

## 2016-01-14 ENCOUNTER — Telehealth: Payer: Self-pay

## 2016-01-14 ENCOUNTER — Ambulatory Visit (INDEPENDENT_AMBULATORY_CARE_PROVIDER_SITE_OTHER): Payer: Federal, State, Local not specified - PPO | Admitting: Adult Health

## 2016-01-14 ENCOUNTER — Ambulatory Visit: Payer: Federal, State, Local not specified - PPO | Admitting: Neurology

## 2016-01-14 ENCOUNTER — Encounter: Payer: Self-pay | Admitting: Adult Health

## 2016-01-14 ENCOUNTER — Telehealth: Payer: Self-pay | Admitting: Neurology

## 2016-01-14 VITALS — BP 148/83 | HR 74 | Ht 65.0 in | Wt 219.0 lb

## 2016-01-14 DIAGNOSIS — G4733 Obstructive sleep apnea (adult) (pediatric): Secondary | ICD-10-CM

## 2016-01-14 DIAGNOSIS — Z9989 Dependence on other enabling machines and devices: Secondary | ICD-10-CM

## 2016-01-14 NOTE — Telephone Encounter (Signed)
I spoke to patient and advised him that Dr. Rexene Alberts is out sick. Rescheduled with [facility] NP.

## 2016-01-14 NOTE — Patient Instructions (Addendum)
Stop by [facility] and have the download a report I will call you with results If your symptoms worsen or you develop new symptoms please let us know.

## 2016-01-14 NOTE — Progress Notes (Addendum)
PATIENT: Brian Newton DOB: January 18, 1966  REASON FOR VISIT: follow up- shirt to sleep apnea on CPAP HISTORY FROM: patient  HISTORY OF PRESENT ILLNESS: Brian Newton is a 50 year old male with a history of obstructive sleep apnea on CPAP. He returns today for a compliance download. The patient didn't bring his machine however Unfortunately we were unable to obtain a download. The patient states that he uses his machine nightly. The patient currently has the nasal mask. He states that his only complaint is that his mouth is very dry. He states that he knows he does not sleep with his mouth open. The patient states that he does chew ice all day long and wonders if that makes him feel as if his mouth is dry since he used to the moisture. Patient states that he goes to bed around 8 PM and arises at 3 AM. He typically gets up may be 1 time a night to urinate. The patient does feel that the CPAP has been beneficial. His only concern is the dry mouth. He returns today for an evaluation. HISTORY 10/06/15: I saw your patient, Brian Newton, upon your kind request, in my neurologic clinic today for initial consultation of his sleep disorder, in particular, concern for underlying obstructive sleep apnea, for reevaluation thereof area the patient is accompanied by his wife today. As you know, Brian Newton is a 50 year old right-handed gentleman with an underlying medical history of hyperlipidemia, hypertension, arthritis, borderline diabetes and morbid obesity, who was previously diagnosed with obstructive sleep apnea and placed on CPAP therapy. He had weight loss surgery with gastric bypass on 11/05/2014 after which she lost a significant amount of weight. He is not fully compliant with CPAP therapy at this time. I reviewed your office note from 10/02/2015. The patient presented recently with a rash and was diagnosed with shingles.  Prior sleep test results were reviewed from 2006: He had a baseline sleep study  on 08/16/2005 08/16/05, which showed a total AHI of 30.6 and an oxyhemoglobin desaturation nadir of 64%, in keeping with severe obstructive sleep apnea. He had a CPAP titration study on 09/27/2005 which showed an AHI of 0.8 per hour on a pressure of 14 cm and oxyhemoglobin desaturation nadir was 87% during that study. He was always compliant with CPAP therapy until March of this year when he started having a lot of dry mouth. His machine is over 30 years old. He has not been using it since March 2016. His BMI at the time of his weight loss surgery was 52.59. He has lost over 100 pounds. He does not drink caffeine, or alcohol, and does not smoke. He works for [employer]. He denies nocturia, RLS Sx or morning HAs. His bedtime is around 7 PM. Rise time varies a little bit depending on how early he has to go to work. Usual work time is 5 AM to 1:30 PM. His usual rise time is 3 AM. His Epworth sleepiness score is 15 out of 24 today, his fatigue score is 24 out of 63. His biggest sleep-related complaint at this time is daytime sleepiness which is worse in the afternoons.  He has 2 daughters, ages 27 and 5.  REVIEW OF SYSTEMS: Out of a complete 14 system review of symptoms, the patient complains only of the following symptoms, and all other reviewed systems are negative.  Dizziness, headache, apnea, daytime sleepiness  ALLERGIES: Allergies  Allergen Reactions  . Penicillins Rash    HOME MEDICATIONS: Outpatient Prescriptions  Prior to Visit  Medication Sig Dispense Refill  . amLODipine-benazepril (LOTREL) 5-20 MG capsule TAKE (2) CAPSULES BY MOUTH ONCE EVERY MORNING. 60 capsule 3  . Cyanocobalamin 500 MCG/0.1ML SOLN Place 500 mcg into the nose once a week.    . loratadine (CLARITIN) 10 MG tablet Take 10 mg by mouth every morning.    . Multiple Vitamins-Minerals (MULTIVITAMIN WITH MINERALS) tablet Take 1 tablet by mouth every morning.     Marland Kitchen OVER THE COUNTER MEDICATION Calcium - 600mg  - vitamin d 1500units -  magnesium - 120mg  - 2 tablets bid    . OVER THE COUNTER MEDICATION Iron - 54mg  - vitamn c 90mg  - 1 qam - 2 pm    . oxyCODONE-acetaminophen (ROXICET) 5-325 MG tablet Take 1-2 tablets by mouth every 8 (eight) hours as needed for severe pain. 40 tablet 0  . pravastatin (PRAVACHOL) 40 MG tablet TAKE (1) TABLET BY MOUTH ONCE DAILY. (Patient not taking: Reported on 10/08/2015) 90 tablet 3   No facility-administered medications prior to visit.    PAST MEDICAL HISTORY: Past Medical History  Diagnosis Date  . 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 Hypercholesteremia   . Hypertension   . Metabolic syndrome   . Arthritis   . Headache(784.0)   . Wrist pain   . Swelling     both legs  . Borderline hyperglycemia   . Sleep apnea     uses C PAP  . Shingles     PAST SURGICAL HISTORY: Past Surgical History  Procedure Laterality Date  . Colonoscopy N/A 03/02/2013    Procedure: COLONOSCOPY;  Surgeon: Beryle Beams, MD;  Location: [facility];  Service: Endoscopy;  Laterality: N/A;  . Breath tek h pylori N/A 08/08/2014    Procedure: BREATH TEK H PYLORI;  Surgeon: Edward Jolly, MD;  Location: [facility];  Service: General;  Laterality: N/A;  . Knee arthroscopy  2001    rt knee  . Appendectomy  1986  . 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 Upper gi endoscopy  11/05/2014    Procedure: UPPER GI ENDOSCOPY;  Surgeon: Excell Seltzer, MD;  Location: [facility];  Service: General;;    FAMILY HISTORY: Family History  Problem Relation Age of Onset  . Hypertension Other   . Hyperlipidemia Other   . Heart disease Other   . Obesity Other   . Sleep apnea Other   . Hypertension Mother   . Breast cancer Maternal Grandmother   . Colon cancer Maternal Grandfather     SOCIAL HISTORY: Social History   Social History    . Marital Status: Married    Spouse Name: N/A  . Number of Children: 2  . Years of Education: HS   Occupational History  . Not on file.   Social History Main Topics  . Smoking status: Never Smoker   . Smokeless tobacco: Never Used  . Alcohol Use: No  . Drug Use: No  . Sexual Activity: Yes   Other Topics Concern  . Not on file   Social History Narrative   Denies caffeine use       PHYSICAL EXAM  Filed Vitals:   01/14/16 1423  BP: 148/83  Pulse: 74  Height: 5\' 5"  (1.651 m)  Weight: 219 lb (99.338 kg)   Body mass index is 36.44 kg/(m^2).  Generalized: Well developed, in  no acute distress  Neck: Mallampati 3+, Conference 16 inches  Neurological examination  Mentation: Alert oriented to time, place, history taking. Follows all commands speech and language fluent Cranial nerve II-XII: Pupils were equal round reactive to light. Extraocular movements were full, visual field were full on confrontational test. Facial sensation and strength were normal. Uvula tongue midline. Head turning and shoulder shrug  were normal and symmetric. Motor: The motor testing reveals 5 over 5 strength of all 4 extremities. Good symmetric motor tone is noted throughout.  Sensory: Sensory testing is intact to soft touch on all 4 extremities. No evidence of extinction is noted.  Coordination: Cerebellar testing reveals good finger-nose-finger and heel-to-shin bilaterally.  Gait and station: Gait is normal. Tandem gait is normal. Romberg is negative. No drift is seen.  Reflexes: Deep tendon reflexes are symmetric and normal bilaterally.   DIAGNOSTIC DATA (LABS, IMAGING, TESTING) - I reviewed patient records, labs, notes, testing and imaging myself where available.  Lab Results  Component Value Date   WBC 5.8 09/24/2015   HGB 14.6 09/24/2015   HCT 44.8 09/24/2015   MCV 92.4 09/24/2015   PLT 275 09/24/2015      Component Value Date/Time   NA 138 09/24/2015 0826   K 4.1 09/24/2015 0826    CL 105 09/24/2015 0826   CO2 28 09/24/2015 0826   GLUCOSE 85 09/24/2015 0826   BUN 18 09/24/2015 0826   CREATININE 0.79 09/24/2015 0826   CREATININE 0.91 11/06/2014 0403   CALCIUM 8.5* 09/24/2015 0826   PROT 5.7* 09/24/2015 0826   ALBUMIN 3.8 09/24/2015 0826   AST 20 09/24/2015 0826   ALT 20 09/24/2015 0826   ALKPHOS 74 09/24/2015 0826   BILITOT 0.5 09/24/2015 0826   GFRNONAA >89 09/24/2015 0826   GFRNONAA >90 11/06/2014 0403   GFRAA >89 09/24/2015 0826   GFRAA >90 11/06/2014 0403   Lab Results  Component Value Date   CHOL 120* 09/24/2015   HDL 41 09/24/2015   LDLCALC 64 09/24/2015   TRIG 76 09/24/2015   CHOLHDL 2.9 09/24/2015   Lab Results  Component Value Date   HGBA1C 5.5 09/24/2015   No results found for: [id number] Lab Results  Component Value Date   TSH 0.860 09/24/2015      ASSESSMENT AND PLAN 51 y.o. 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 here with:  1. Obstructive sleep apnea on CPAP  We were unable to obtain a download today. The patient left the office visit and went to his DME company. He called [place] back and informed us that his machine does not have a card and they were not able to obtain a download either. He states that they are evaluating his machine and will give him more information. He states he will call us when he knows more. The patient may need a new machine if we are unable to obtain his data. He will follow-up in 6 months or sooner if needed.   Ward Givens, MSN, NP-C 01/14/2016, 5:20 PM [facility] [address] [place], [place] [address] [phone]   I reviewed the above note and documentation by the Nurse Practitioner and agree with the history, physical exam, assessment and plan as outlined  above.   Star Age, MD, PhD [facility])

## 2016-01-14 NOTE — Telephone Encounter (Signed)
I let Jinny Blossom know since she is seeing the patient today.

## 2016-01-14 NOTE — Telephone Encounter (Signed)
Pt called said his machine does not have a card. He called [employer] and he was told they remember his machine and know they can't read it. He said a supervisor is going to check to find out where she can get equipment to read the machine if all possible.  Pt will call back tomorrow when he knows something.

## 2016-01-14 NOTE — Telephone Encounter (Signed)
Noted. The patient may need a new machine? I will wait for him to call back with more information

## 2016-01-14 NOTE — Telephone Encounter (Signed)
Erica with [employer] is returning your call regarding the patient. She says she cannot get any CPAP information on the patient until he brings his CPAP card to the pharmacy. She will call the patient and advise him of this.

## 2016-01-15 ENCOUNTER — Telehealth: Payer: Self-pay

## 2016-01-15 NOTE — Telephone Encounter (Signed)
Brian Newton will call the patient tomorrow morning to discuss options.

## 2016-01-15 NOTE — Telephone Encounter (Signed)
Mr. Olivar called me he stated that Megan yesterday and then today [facility] cannot read his CPAP stuff either and he does know what to do next and he can't remember where he got it from. Please advise pt as what to do next.

## 2016-01-20 ENCOUNTER — Ambulatory Visit: Payer: Self-pay | Admitting: Neurology

## 2016-01-22 ENCOUNTER — Telehealth: Payer: Self-pay | Admitting: Adult Health

## 2016-01-22 DIAGNOSIS — G4733 Obstructive sleep apnea (adult) (pediatric): Secondary | ICD-10-CM

## 2016-01-22 DIAGNOSIS — Z9989 Dependence on other enabling machines and devices: Secondary | ICD-10-CM

## 2016-01-22 NOTE — Telephone Encounter (Signed)
I called the patient. He has a very old CPAP machine and therefore we had unable to obtain downloads. The patient's machine also malfunctions. He states that the on and off button  does not work properly. He states that there are times the machine will turn off or cut off by itself. I will write an order for the patient to have a new CPAP machine.

## 2016-01-22 NOTE — Telephone Encounter (Signed)
Brian Newton, this patient said Shirlean Mylar Hyler left message on Friday that she and you will discuss with him possibilities regarding his CPAP.  He received Robin's message too late and she had left for the day.  She is off this week and next week so he is asking to speak with you.  He said that he is off today and would like a call from you.  His phone number is [phone]  MRN [id number]

## 2016-01-28 NOTE — Telephone Encounter (Signed)
Pt called said he called [employer] and they do not have any information regarding CPAP machine.

## 2016-01-28 NOTE — Telephone Encounter (Signed)
I printed and faxed Megan's order for pt's new cpap to [facility].

## 2016-01-28 NOTE — Telephone Encounter (Signed)
This order was put on 2/2. I will print the order and resend to [place] [facility].

## 2016-02-05 NOTE — Telephone Encounter (Signed)
Patient called back and I advised him of what kind of readings Dr. Rexene Alberts looks for. This machine will cost more than the one at [facility]. Patient states that [facility] has a high set up cost. I suggested that I can send order to [facility] and see if it is cheaper through them. He was agreeable with this. Message has been sent to [facility] with [facility].

## 2016-02-05 NOTE — Telephone Encounter (Signed)
Patient called back, needs to know "what kind of data the CPAP machine needs to record, one machine records hours of use, the higher priced machine records all kinds of data".

## 2016-02-05 NOTE — Telephone Encounter (Signed)
Patient is calling back. He states he received a call from [place] regarding a CPAP. The machine would cost too much even with his insurance. The patient saw a CPAP on CPAP.com and it would be much cheaper but the patient would need to get a Rx in order to get the new CPAP. The patient would like to discuss this CPAP to make sure this would be ok.

## 2016-02-05 NOTE — Telephone Encounter (Signed)
I printed autoPAP order from his chart. He can use this to get the machine online, if he wishes.  Please let patient know.

## 2016-02-05 NOTE — Telephone Encounter (Signed)
What is your opinion of buying off intranet?

## 2016-02-05 NOTE — Telephone Encounter (Signed)
I spoke to patient and he asked me to mail Rx to him. Confirmed address. I spent 20 minutes on the phone with the patient answering further questions about CPAP. He voiced understanding.

## 2016-02-11 ENCOUNTER — Telehealth: Payer: Self-pay | Admitting: Neurology

## 2016-02-11 NOTE — Telephone Encounter (Signed)
Patient called to advise, [facility] was able to read his old CPAP machine and will be sending those to our office. Patient states if we don't get that within the next day or two to call him back and he will bring his copy to [place].

## 2016-02-12 NOTE — Telephone Encounter (Signed)
Received download today

## 2016-02-18 ENCOUNTER — Telehealth: Payer: Self-pay | Admitting: Neurology

## 2016-02-18 NOTE — Telephone Encounter (Signed)
LM on cell (per DPR) with results and recommendations below. Advised him to call [facility] about the leaking. Left my call back number for any further questions.

## 2016-02-18 NOTE — Telephone Encounter (Signed)
Patient called back and states that his machine has not told him that it is leaking. And states that he uses it every night. I advised him to call [facility] and set an appt for them to look at his machine and assess the leaks. He voiced understanding.

## 2016-02-18 NOTE — Telephone Encounter (Signed)
I received CPAP compliance data from his DME company, [employer]. This is a total compliance date range from 11/08/2014 through 02/05/2016, total of 447 days, during which time he has not always used his CPAP, days not used were 267. Percent used days greater than 4 hours only at 36%, indicating poor to mediocre compliance. AutoPap set from 8-15 cm, 95th percentile pressure was 11.1 cm, leak quite high with the 95th percentile at 43.2 L/m.  Beverlee Nims: Please get in touch with patient regarding his compliance. Please advise them to be fully compliant with treatment and advise him to get in touch with his DME company regarding high leak from the mask. They need to work with him to reduce the air leakage. Thanks!

## 2016-02-19 NOTE — Telephone Encounter (Signed)
I called [facility] to get more information. They saw him this morning and fitted him for a new mask, they are faxing [place] a more recent 30 day download. RT feels that the leaks are coming from him moving during the night.

## 2016-02-19 NOTE — Telephone Encounter (Signed)
Pt called and says that the machine does not have any leaks and [facility] faxed a print out of pts 3 month use of cpap.

## 2016-03-31 DIAGNOSIS — K080 Exfoliation of teeth due to systemic causes: Secondary | ICD-10-CM | POA: Diagnosis not present

## 2016-04-01 ENCOUNTER — Other Ambulatory Visit: Payer: Federal, State, Local not specified - PPO

## 2016-04-01 DIAGNOSIS — I10 Essential (primary) hypertension: Secondary | ICD-10-CM

## 2016-04-01 DIAGNOSIS — E785 Hyperlipidemia, unspecified: Secondary | ICD-10-CM | POA: Diagnosis not present

## 2016-04-01 DIAGNOSIS — Z79899 Other long term (current) drug therapy: Secondary | ICD-10-CM | POA: Diagnosis not present

## 2016-04-01 DIAGNOSIS — E8881 Metabolic syndrome: Secondary | ICD-10-CM

## 2016-04-01 LAB — CBC WITH DIFFERENTIAL/PLATELET
BASOS ABS: 0 {cells}/uL (ref 0–200)
Basophils Relative: 0 %
EOS ABS: 147 {cells}/uL (ref 15–500)
Eosinophils Relative: 3 %
HCT: 43.3 % (ref 38.5–50.0)
HEMOGLOBIN: 14.3 g/dL (ref 13.0–17.0)
Lymphocytes Relative: 40 %
Lymphs Abs: 1960 cells/uL (ref 850–3900)
MCH: 30.4 pg (ref 27.0–33.0)
MCHC: 33 g/dL (ref 32.0–36.0)
MCV: 91.9 fL (ref 80.0–100.0)
MONOS PCT: 11 %
MPV: 9.7 fL (ref 7.5–12.5)
Monocytes Absolute: 539 cells/uL (ref 200–950)
NEUTROS ABS: 2254 {cells}/uL (ref 1500–7800)
NEUTROS PCT: 46 %
PLATELETS: 280 10*3/uL (ref 140–400)
RBC: 4.71 MIL/uL (ref 4.20–5.80)
RDW: 14.2 % (ref 11.0–15.0)
WBC: 4.9 10*3/uL (ref 3.8–10.8)

## 2016-04-01 LAB — COMPLETE METABOLIC PANEL WITH GFR
ALT: 18 U/L (ref 9–46)
AST: 21 U/L (ref 10–35)
Albumin: 3.9 g/dL (ref 3.6–5.1)
Alkaline Phosphatase: 81 U/L (ref 40–115)
BUN: 25 mg/dL (ref 7–25)
CALCIUM: 9.2 mg/dL (ref 8.6–10.3)
CHLORIDE: 105 mmol/L (ref 98–110)
CO2: 28 mmol/L (ref 20–31)
CREATININE: 0.85 mg/dL (ref 0.70–1.33)
Glucose, Bld: 90 mg/dL (ref 70–99)
POTASSIUM: 4.3 mmol/L (ref 3.5–5.3)
Sodium: 140 mmol/L (ref 135–146)
Total Bilirubin: 0.7 mg/dL (ref 0.2–1.2)
Total Protein: 5.9 g/dL — ABNORMAL LOW (ref 6.1–8.1)

## 2016-04-01 LAB — LIPID PANEL
CHOL/HDL RATIO: 2.7 ratio (ref <=5.0)
CHOLESTEROL: 141 mg/dL (ref 125–200)
HDL: 53 mg/dL (ref >=40)
LDL Cholesterol: 79 mg/dL (ref <130)
TRIGLYCERIDES: 44 mg/dL (ref <150)
VLDL: 9 mg/dL (ref <30)

## 2016-04-02 LAB — HEMOGLOBIN A1C
Hgb A1c MFr Bld: 5.4 % (ref <5.7)
Mean Plasma Glucose: 108 mg/dL

## 2016-04-07 ENCOUNTER — Encounter: Payer: Self-pay | Admitting: Dietician

## 2016-04-07 ENCOUNTER — Encounter: Payer: Federal, State, Local not specified - PPO | Attending: General Surgery | Admitting: Dietician

## 2016-04-07 DIAGNOSIS — Z029 Encounter for administrative examinations, unspecified: Secondary | ICD-10-CM | POA: Insufficient documentation

## 2016-04-07 NOTE — Progress Notes (Signed)
  Follow-up visit:  17 months Post-Operative RYGB Surgery  Medical Nutrition Therapy:  Appt start time: 805 end time: 830   Primary concerns today: Post-operative Bariatric Surgery Nutrition Management.  Brian Newton returns today having regained about 6 pounds since his last visit 6 months ago. He reports that he is still feeling good about his weight. Weighs himself several times a week. Still not having any vomiting. Having a lot of gas with his stool. Sometimes has trouble tolerating chicken. Still supplements with shakes if needed.   Surgery date: 11/05/2014 Surgery type: RYGB Start weight at [facility]: 344.5 on 07/25/14 (356 lbs per patient) Weight today: 216.5 lbs Weight change: 6.5 lbs gain Total weight lost: 140 lbs  TANITA  BODY COMP RESULTS  pre op  11/19/14 01/01/15 04/08/15 10/08/15 04/07/16   BMI (kg/m^2) 56 50.9 46.3 38.6 34.9 36   Fat Mass (lbs) 175.5 152.5 114.5 63.5 58 67   Fat Free Mass (lbs) 161 153.5 164 168.5 152 149.5   Total Body Water (lbs) 118 112.5 120 123.5 111.5 109.5    Preferred Learning Style:   No preference indicated   Learning Readiness:   Ready  24-hr recall: B (3AM): premier protein shake and 1-2 deviled eggs (37g) Snk (7:30AM):  [place] yogurt (12g) L (11:30AM): Cheese stick wrapped in ham or [place] (14g) Snk (3-4PM):  Salad with hamburger patty, sometimes fruit (14g) D (7PM): Atkins or Premier or Quest protein bar (15-17g) Snk (PM):  Fluid intake: water (64+ oz) Estimated total protein intake: 90+ g per day  Medications: off 1 BP pill and no longer on cholesterol meds Supplementation: taking  Drinking while eating: no Hair loss: unknown Carbonated beverages: no N/V/D/C: having a lot of gas with his stool but no pain or bloating Dumping syndrome: none  Recent physical activity:  More active overall  Progress Towards Goal(s):  In progress.    Nutritional Diagnosis:  -3.3 Overweight/obesity related to past poor dietary habits and physical  inactivity as evidenced by patient w/ recent RYGB surgery following dietary guidelines for continued weight loss.     Intervention:  Nutrition counseling provided.  Teaching Method Utilized:  Visual Auditory  Barriers to learning/adherence to lifestyle change: none  Demonstrated degree of understanding via:  Teach Back   Monitoring/Evaluation:  Dietary intake, exercise, and body weight. Follow up in 6 months for 23 month post-op visit.

## 2016-04-07 NOTE — Patient Instructions (Signed)
-  Continue to avoid slushies  -Resume workout routine (pool exercises)

## 2016-04-08 ENCOUNTER — Ambulatory Visit (INDEPENDENT_AMBULATORY_CARE_PROVIDER_SITE_OTHER): Payer: Federal, State, Local not specified - PPO | Admitting: Family Medicine

## 2016-04-08 ENCOUNTER — Encounter: Payer: Self-pay | Admitting: Family Medicine

## 2016-04-08 VITALS — BP 136/78 | HR 68 | Temp 98.1°F | Resp 18 | Ht 65.0 in | Wt 218.0 lb

## 2016-04-08 DIAGNOSIS — E8881 Metabolic syndrome: Secondary | ICD-10-CM | POA: Diagnosis not present

## 2016-04-08 DIAGNOSIS — I10 Essential (primary) hypertension: Secondary | ICD-10-CM

## 2016-04-08 DIAGNOSIS — E7800 Pure hypercholesterolemia, unspecified: Secondary | ICD-10-CM

## 2016-04-08 NOTE — Progress Notes (Signed)
Subjective:    Patient ID: Brian Newton, male    DOB: [birth year], 50 y.o.   MRN: [id number]  HPI Continues to do extremely well with his diet exercise. Denies any chest pain shortness of breath or dyspnea on exertion. Denies any myalgias or right upper quadrant pain. Denies any polyuria polydipsia or blurred vision. Most recent lab work shows almost complete resolution of his prediabetes. His cholesterol is outstanding. His blood pressure is well controlled even after decreasing his Lotrel to 5/20 by mouth daily Past Medical History  Diagnosis Date  . 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 Hypercholesteremia   . Hypertension   . Metabolic syndrome   . Arthritis   . Headache(784.0)   . Wrist pain   . Swelling     both legs  . Borderline hyperglycemia   . Sleep apnea     uses C PAP  . Shingles    Current Outpatient Prescriptions on File Prior to Visit  Medication Sig Dispense Refill  . amLODipine-benazepril (LOTREL) 5-20 MG capsule TAKE (2) CAPSULES BY MOUTH ONCE EVERY MORNING. 60 capsule 3  . Cyanocobalamin 500 MCG/0.1ML SOLN Place 500 mcg into the nose once a week.    . loratadine (CLARITIN) 10 MG tablet Take 10 mg by mouth every morning.    . Multiple Vitamins-Minerals (MULTIVITAMIN WITH MINERALS) tablet Take 1 tablet by mouth every morning.     Marland Kitchen OVER THE COUNTER MEDICATION Calcium - 600mg  - vitamin d 1500units - magnesium - 120mg  - 2 tablets bid    . OVER THE COUNTER MEDICATION Iron - 54mg  - vitamn c 90mg  - 1 qam - 2 pm    . oxyCODONE-acetaminophen (ROXICET) 5-325 MG tablet Take 1-2 tablets by mouth every 8 (eight) hours as needed for severe pain. 40 tablet 0   No current facility-administered medications on file prior to visit.   Allergies  Allergen Reactions  . Penicillins Rash   Social History   Social History  . Marital Status: Married    Spouse Name: N/A  . Number of Children: 2  . Years of Education: HS   Occupational History  . Not on  file.   Social History Main Topics  . Smoking status: Never Smoker   . Smokeless tobacco: Never Used  . Alcohol Use: No  . Drug Use: No  . Sexual Activity: Yes   Other Topics Concern  . Not on file   Social History Narrative   Denies caffeine use       Review of Systems  All other systems reviewed and are negative.      Objective:   Physical Exam  Constitutional: He appears well-developed and well-nourished. No distress.  Neck: Neck supple. No thyromegaly present.  Cardiovascular: Normal rate, regular rhythm and normal heart sounds.  Exam reveals no gallop and no friction rub.   No murmur heard. Pulmonary/Chest: Effort normal and breath sounds normal. No respiratory distress. He has no wheezes. He has no rales.  Abdominal: Soft. Bowel sounds are normal. He exhibits no distension. There is no tenderness. There is no rebound and no guarding.  Lymphadenopathy:    He has no cervical adenopathy.  Skin: He is not diaphoretic.  Vitals reviewed.       Assessment & Plan:  Essential hypertension  Hypercholesteremia  Metabolic syndrome   Cholesterol is outstanding even off pravastatin. Blood pressures well controlled. Continue Lotrel 5/20 one by mouth daily. Metabolic syndrome has essentially resolved since his gastric bypass surgery. Schedule patient  for complete physical exam in October and then I will begin annual checkups rather than every 6 months

## 2016-04-20 DIAGNOSIS — I809 Phlebitis and thrombophlebitis of unspecified site: Secondary | ICD-10-CM | POA: Diagnosis not present

## 2016-04-27 ENCOUNTER — Encounter: Payer: Self-pay | Admitting: Family Medicine

## 2016-04-27 ENCOUNTER — Telehealth: Payer: Self-pay | Admitting: Family Medicine

## 2016-04-27 ENCOUNTER — Ambulatory Visit (INDEPENDENT_AMBULATORY_CARE_PROVIDER_SITE_OTHER): Payer: Federal, State, Local not specified - PPO | Admitting: Family Medicine

## 2016-04-27 VITALS — BP 120/84 | HR 70 | Temp 97.7°F | Resp 18 | Wt 221.0 lb

## 2016-04-27 DIAGNOSIS — R600 Localized edema: Secondary | ICD-10-CM

## 2016-04-27 NOTE — Telephone Encounter (Signed)
Per [facility] - stop medication until after doppler - pt aware

## 2016-04-27 NOTE — Progress Notes (Signed)
Subjective:    Patient ID: Brian Newton, male    DOB: June 02, 1966, 50 y.o.   MRN: [id number]  HPI Friday, without warning, the patient developed a lump on his left leg below his kneecap. It was soft and fluctuant. It is extremely painful. He went to an urgent care where he was told that it could be an abscess or a could be thrombophlebitis per his report. They recommended an anti-inflammatory. The following morning was gone. However since that time his leg is been extremely swollen. Calf circumference is 43 cm in the left leg compared to 39 cm and the right leg. There is extensive bruising on the anterior surface of his leg from his knee down to his ankle. He has +1 pitting edema. He has a negative Homans sign. He denies any shortness of breath. He denies any pleurisy. Past Medical History  Diagnosis Date  . 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 Hypercholesteremia   . Hypertension   . Metabolic syndrome   . Arthritis   . Headache(784.0)   . Wrist pain   . Swelling     both legs  . Borderline hyperglycemia   . Sleep apnea     uses C PAP  . Shingles    Past Surgical History  Procedure Laterality Date  . Colonoscopy N/A 03/02/2013    Procedure: COLONOSCOPY;  Surgeon: Beryle Beams, MD;  Location: [facility];  Service: Endoscopy;  Laterality: N/A;  . Breath tek h pylori N/A 08/08/2014    Procedure: BREATH TEK H PYLORI;  Surgeon: Edward Jolly, MD;  Location: [facility];  Service: General;  Laterality: N/A;  . Knee arthroscopy  2001    rt knee  . Appendectomy  1986  . 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 Upper gi endoscopy  11/05/2014    Procedure: UPPER GI ENDOSCOPY;  Surgeon: Excell Seltzer, MD;  Location: [facility];  Service: General;;   Current Outpatient Prescriptions on File Prior to Visit   Medication Sig Dispense Refill  . amLODipine-benazepril (LOTREL) 5-20 MG capsule TAKE (2) CAPSULES BY MOUTH ONCE EVERY MORNING. (Patient taking differently: TAKE 1 CAPSULE BY MOUTH ONCE EVERY MORNING.) 60 capsule 3  . Cyanocobalamin 500 MCG/0.1ML SOLN Place 500 mcg into the nose once a week.    . loratadine (CLARITIN) 10 MG tablet Take 10 mg by mouth every morning.    . Multiple Vitamins-Minerals (MULTIVITAMIN WITH MINERALS) tablet Take 1 tablet by mouth every morning.     Marland Kitchen OVER THE COUNTER MEDICATION Calcium - 600mg  - vitamin d 1500units - magnesium - 120mg  - 2 tablets bid    . OVER THE COUNTER MEDICATION Iron - 54mg  - vitamn c 90mg  - 1 qam - 2 pm    . oxyCODONE-acetaminophen (ROXICET) 5-325 MG tablet Take 1-2 tablets by mouth every 8 (eight) hours as needed for severe pain. 40 tablet 0   No current facility-administered medications on file prior to visit.   Allergies  Allergen Reactions  . Penicillins Rash   Social History   Social History  . Marital Status: Married    Spouse Name: N/A  . Number of Children: 2  . Years of Education: HS   Occupational History  . Not on file.   Social History Main Topics  . Smoking status: Never Smoker   . Smokeless tobacco: Never Used  .  Alcohol Use: No  . Drug Use: No  . Sexual Activity: Yes   Other Topics Concern  . Not on file   Social History Narrative   Denies caffeine use       Review of Systems  All other systems reviewed and are negative.      Objective:   Physical Exam  Constitutional: He appears well-developed and well-nourished.  Cardiovascular: Normal rate, regular rhythm and normal heart sounds.   Pulmonary/Chest: Effort normal and breath sounds normal. No respiratory distress. He has no wheezes. He has no rales.  Musculoskeletal: He exhibits edema.  Skin: Bruising and ecchymosis noted.  Vitals reviewed.         Assessment & Plan:  Edema of left lower extremity - Plan: US Venous Img Lower Unilateral Left,  CANCELED: LE VENOUS  I suspect that the patient was working on his knee Friday and likely damaged the deep subcutaneous vein due to pressure. I believe he subsequently developed a hematoma below his left knee. I believe this hematoma "ruptured" causing the bruising in his lower leg and the swelling in his lower leg.  I believe this bruising and swelling will resolve spontaneously and there is no evidence of thrombophlebitis. There is no erythema or warmth or pain in the leg. There is no evidence of cellulitis or an abscess. However given the sudden onset of the unilateral edema, I believe it is prudent to obtain venous ultrasound of the leg to rule out DVT. ASAP.

## 2016-04-27 NOTE — Telephone Encounter (Signed)
Patient calling because he forgot to ask dr pickard if he still needed to take his anti-inflammatory After his visit today  [phone] (H)

## 2016-04-28 ENCOUNTER — Ambulatory Visit (HOSPITAL_COMMUNITY)
Admission: RE | Admit: 2016-04-28 | Discharge: 2016-04-28 | Disposition: A | Discharge status: Home / Self Care | Payer: Federal, State, Local not specified - PPO | Source: Ambulatory Visit | Attending: Family Medicine | Admitting: Family Medicine

## 2016-04-28 DIAGNOSIS — R600 Localized edema: Secondary | ICD-10-CM | POA: Diagnosis not present

## 2016-04-29 ENCOUNTER — Encounter: Payer: Self-pay | Admitting: Family Medicine

## 2016-05-21 ENCOUNTER — Other Ambulatory Visit: Payer: Self-pay | Admitting: Family Medicine

## 2016-05-21 NOTE — Telephone Encounter (Signed)
Refill appropriate and filled per protocol. 

## 2016-06-08 ENCOUNTER — Encounter: Payer: Self-pay | Admitting: Family Medicine

## 2016-06-08 ENCOUNTER — Ambulatory Visit (INDEPENDENT_AMBULATORY_CARE_PROVIDER_SITE_OTHER): Payer: Federal, State, Local not specified - PPO | Admitting: Family Medicine

## 2016-06-08 VITALS — BP 146/90 | HR 78 | Temp 98.0°F | Resp 18 | Wt 214.0 lb

## 2016-06-08 DIAGNOSIS — M25531 Pain in right wrist: Secondary | ICD-10-CM

## 2016-06-08 DIAGNOSIS — R109 Unspecified abdominal pain: Secondary | ICD-10-CM

## 2016-06-08 MED ORDER — DICLOFENAC SODIUM 75 MG PO TBEC: 75.0000 mg | DELAYED_RELEASE_TABLET | Freq: Two times a day (BID) | ORAL | Status: DC

## 2016-06-08 NOTE — Progress Notes (Signed)
Subjective:    Patient ID: Brian Newton, male    DOB: June 21, 1966, 50 y.o.   MRN: [id number]  HPI  Patient reports several weeks of pain in his right wrist. There has been no injury. The pain is over the abductor tendon of the thumb. There is no pain in the snuffbox. He has a positive Finkelstein test. There is no palpable abnormality or bony tenderness to palpation. He also reports left flank pain off and on for 2 weeks. He denies any pleurisy. He denies any hematuria or dysuria. He denies any blood in the stool. The pain is worse in certain positions such as standing or sitting however he lays on his side the pain will go away. It seems to be positional. Past Medical History  Diagnosis Date  . 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 Hypercholesteremia   . Hypertension   . Metabolic syndrome   . Arthritis   . Headache(784.0)   . Wrist pain   . Swelling     both legs  . Borderline hyperglycemia   . Sleep apnea     uses C PAP  . Shingles    Past Surgical History  Procedure Laterality Date  . Colonoscopy N/A 03/02/2013    Procedure: COLONOSCOPY;  Surgeon: Beryle Beams, MD;  Location: [facility];  Service: Endoscopy;  Laterality: N/A;  . Breath tek h pylori N/A 08/08/2014    Procedure: BREATH TEK H PYLORI;  Surgeon: Edward Jolly, MD;  Location: [facility];  Service: General;  Laterality: N/A;  . Knee arthroscopy  2001    rt knee  . Appendectomy  1986  . 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 Upper gi endoscopy  11/05/2014    Procedure: UPPER GI ENDOSCOPY;  Surgeon: Excell Seltzer, MD;  Location: [facility];  Service: General;;   Current Outpatient Prescriptions on File Prior to Visit  Medication Sig Dispense Refill  . amLODipine-benazepril (LOTREL) 5-20 MG capsule TAKE (2) CAPSULES BY MOUTH  ONCE EVERY MORNING. 60 capsule 0  . Cyanocobalamin 500 MCG/0.1ML SOLN Place 500 mcg into the nose once a week.    . loratadine (CLARITIN) 10 MG tablet Take 10 mg by mouth every morning.    . Multiple Vitamins-Minerals (MULTIVITAMIN WITH MINERALS) tablet Take 1 tablet by mouth every morning.     . nabumetone (RELAFEN) 750 MG tablet     . OVER THE COUNTER MEDICATION Calcium - 600mg  - vitamin d 1500units - magnesium - 120mg  - 2 tablets bid    . OVER THE COUNTER MEDICATION Iron - 54mg  - vitamn c 90mg  - 1 qam - 2 pm    . oxyCODONE-acetaminophen (ROXICET) 5-325 MG tablet Take 1-2 tablets by mouth every 8 (eight) hours as needed for severe pain. 40 tablet 0   No current facility-administered medications on file prior to visit.   Allergies  Allergen Reactions  . Penicillins Rash   Social History   Social History  . Marital Status: Married    Spouse Name: N/A  . Number of Children: 2  . Years of Education: HS   Occupational History  . Not on file.   Social History Main Topics  . Smoking status: Never Smoker   . Smokeless tobacco: Never Used  . Alcohol Use: No  . Drug Use: No  . Sexual Activity: Yes   Other  Topics Concern  . Not on file   Social History Narrative   Denies caffeine use      Review of Systems  All other systems reviewed and are negative.      Objective:   Physical Exam  Cardiovascular: Normal rate, regular rhythm and normal heart sounds.   Pulmonary/Chest: Effort normal and breath sounds normal. No respiratory distress. He has no wheezes. He has no rales.  Musculoskeletal:       Right wrist: He exhibits decreased range of motion, tenderness and bony tenderness.  Vitals reviewed.         Assessment & Plan:  Wrist pain, acute, right - Plan: DG Wrist Complete Right, diclofenac (VOLTAREN) 75 MG EC tablet  Left flank pain  I believe the patient has dequervain's tenosynovitis. Recommended a thumb spica splint, ice every night, diclofenac 75 mg by mouth  twice a day for 2 weeks. Proceed with an x-ray to rule out arthritis in the wrist. I am unsure of the cause of his left flank pain. I believe is likely muscular. Try diclofenac for 2 weeks and workup further if persistent

## 2016-06-15 ENCOUNTER — Encounter: Payer: Self-pay | Admitting: *Deleted

## 2016-07-12 ENCOUNTER — Telehealth: Payer: Self-pay

## 2016-07-12 NOTE — Telephone Encounter (Signed)
I spoke to patient. We are unable to download his CPAP machine. He will bring in copy of most recent download with him.

## 2016-07-13 ENCOUNTER — Encounter: Payer: Self-pay | Admitting: Neurology

## 2016-07-13 ENCOUNTER — Ambulatory Visit (INDEPENDENT_AMBULATORY_CARE_PROVIDER_SITE_OTHER): Payer: Federal, State, Local not specified - PPO | Admitting: Neurology

## 2016-07-13 VITALS — BP 146/93 | HR 57 | Resp 16 | Ht 65.0 in | Wt 207.0 lb

## 2016-07-13 DIAGNOSIS — Z9989 Dependence on other enabling machines and devices: Secondary | ICD-10-CM

## 2016-07-13 DIAGNOSIS — Z9884 Bariatric surgery status: Secondary | ICD-10-CM | POA: Diagnosis not present

## 2016-07-13 DIAGNOSIS — G4733 Obstructive sleep apnea (adult) (pediatric): Secondary | ICD-10-CM

## 2016-07-13 DIAGNOSIS — E669 Obesity, unspecified: Secondary | ICD-10-CM

## 2016-07-13 NOTE — Progress Notes (Signed)
Subjective:    Patient ID: Brian Newton is a 50 y.o. male.  HPI     Interim history:   Mr. Brian Newton is a 50 year old right-handed gentleman with an underlying medical history of hyperlipidemia, hypertension, arthritis, borderline diabetes and morbid obesity, who presents for follow-up consultation of his obstructive sleep apnea, after his recent sleep study. The patient is unaccompanied today. I first met him on 10/06/2015 at the request of his primary care physician, at which time he reported a prior diagnosis of OSA and treatment with CPAP but significant weight loss after his gastric bypass surgery in November 2015. I invited him back for sleep study for reevaluation. He had a baseline sleep study on 11/04/2015. I went over his test results with him in detail today. His sleep efficiency was normal at 90.4% with a sleep latency of 6.5 minutes and wake after sleep onset of 41 minutes with moderate sleep fragmentation noted. He had an increased percentage of stage II sleep, absence of slow-wave sleep and a normal percentage of REM sleep with a normal REM latency. He had no significant PLMS, EKG or EEG changes. He had mild to moderate snoring. Total AHI was mildly elevated at 9.1 per hour, rising to 14.3 per hour in the supine position and 11.7 per hour during REM sleep. Average oxygen saturation was 94%, nadir was 84%. Time below 90% saturation was 20 minutes, time below 88% saturation was 6 minutes. Based on his test results suggested treatment in the form of AutoPap at home.   Of note, he was seen in the interim by nurse practitioner, Ward Givens on 01/14/2016, at which time he reported improved sleep with AutoPap but complained of mouth dryness. A CPAP download was not obtainable at the time.  Today, 07/13/2016: I reviewed his AutoPap compliance data from 06/01/2016 through 07/03/2016 which is a total of 30 days during which time he used his machine every day with percent used days greater  than 4 hours at 96%, indicating excellent compliance with an average usage of 6 hours and 18 minutes, residual AHI 3.1 per hour, 95th percentile pressure of 9.8 cm, leak at times high with the 95th percentile at 30 L/m, minimum pressure 5 cm, maximum pressure 15 cm.  Today, 07/13/2016: He reports doing well with AutoPap, tolerates it well, uses a nasal mask. He has had mild interim weight loss, reports that he had some interim weight gain in the wintertime. He is followed by nutrition regularly, he takes his supplements throughout the day. Overall, he feels he is doing well, migraine headaches are less frequent than before.  Previously:  10/06/2015: He was previously diagnosed with obstructive sleep apnea and placed on CPAP therapy. He had weight loss surgery with gastric bypass on 11/05/2014 after which she lost a significant amount of weight. He is not fully compliant with CPAP therapy at this time. I reviewed your office note from 10/02/2015. The patient presented recently with a rash and was diagnosed with shingles.   Prior sleep test results were reviewed from 2006: He had a baseline sleep study on 08/16/2005 08/16/05, which showed a total AHI of 30.6 and an oxyhemoglobin desaturation nadir of 64%, in keeping with severe obstructive sleep apnea. He had a CPAP titration study on 09/27/2005 which showed an AHI of 0.8 per hour on a pressure of 14 cm and oxyhemoglobin desaturation nadir was 87% during that study. He was always compliant with CPAP therapy until March of this year when he started having  a lot of dry mouth. His machine is over 30 years old. He has not been using it since March 2016. His BMI at the time of his weight loss surgery was 52.59. He has lost over 100 pounds. He does not drink caffeine, or alcohol, and does not smoke. He works for [employer]. He denies nocturia, RLS Sx or morning HAs. His bedtime is around 7 PM. Rise time varies a little bit depending on how early he has to go to work. Usual  work time is 5 AM to 1:30 PM. His usual rise time is 3 AM. His Epworth sleepiness score is 15 out of 24 today, his fatigue score is 24 out of 63. His biggest sleep-related complaint at this time is daytime sleepiness which is worse in the afternoons.   He has 2 daughters, ages 73 and 53.   His Past Medical History Is Significant For: Past Medical History:  Diagnosis Date  . Arthritis   . Borderline hyperglycemia   . Headache(784.0)   . Hypercholesteremia   . Hypertension   . Metabolic syndrome   . Shingles   . Sleep apnea    uses C PAP  . Swelling    both legs  . 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 Wrist pain     His Past Surgical History Is Significant For: Past Surgical History:  Procedure Laterality Date  . APPENDECTOMY  1986  . BREATH TEK H PYLORI N/A 08/08/2014   Procedure: BREATH TEK H PYLORI;  Surgeon: Edward Jolly, MD;  Location: [facility];  Service: General;  Laterality: N/A;  . COLONOSCOPY N/A 03/02/2013   Procedure: COLONOSCOPY;  Surgeon: Beryle Beams, MD;  Location: [facility];  Service: Endoscopy;  Laterality: N/A;  . 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 KNEE ARTHROSCOPY  2001   rt knee  . UPPER GI ENDOSCOPY  11/05/2014   Procedure: UPPER GI ENDOSCOPY;  Surgeon: Excell Seltzer, MD;  Location: [facility];  Service: General;;    His Family History Is Significant For: Family History  Problem Relation Age of Onset  . Hypertension Other   . Hyperlipidemia Other   . Heart disease Other   . Obesity Other   . Sleep apnea Other   . Hypertension Mother   . Breast cancer Maternal Grandmother   . Colon cancer Maternal Grandfather     His Social History Is Significant For: Social History   Social History  . Marital status: Married    Spouse name: N/A  . Number of  children: 2  . Years of education: HS   Social History Main Topics  . Smoking status: Never Smoker  . Smokeless tobacco: Never Used  . Alcohol use No  . Drug use: No  . Sexual activity: Yes   Other Topics Concern  . None   Social History Narrative   Denies caffeine use     His Allergies Are:  Allergies  Allergen Reactions  . Penicillins Rash  :   His Current Medications Are:  Outpatient Encounter Prescriptions as of 07/13/2016  Medication Sig  . amLODipine-benazepril (LOTREL) 5-20 MG capsule TAKE (2) CAPSULES BY MOUTH ONCE EVERY MORNING. (Patient taking differently: TAKE 1 CAP BY MOUTH ONCE EVERY MORNING.)  . Cyanocobalamin 500 MCG/0.1ML SOLN Place 500 mcg into the nose once a week.  . loratadine (CLARITIN) 10 MG tablet Take  10 mg by mouth every morning.  . Multiple Vitamins-Minerals (MULTIVITAMIN WITH MINERALS) tablet Take 1 tablet by mouth every morning.   Marland Kitchen OVER THE COUNTER MEDICATION Calcium - 620m - vitamin d 1500units - magnesium - 1215m- 2 tablets bid  . OVER THE COUNTER MEDICATION Iron - 544m vitamn c 33m18m1 qam - 2 pm  . oxyCODONE-acetaminophen (ROXICET) 5-325 MG tablet Take 1-2 tablets by mouth every 8 (eight) hours as needed for severe pain. (Patient not taking: Reported on 07/13/2016)  . [DISCONTINUED] diclofenac (VOLTAREN) 75 MG EC tablet Take 1 tablet (75 mg total) by mouth 2 (two) times daily.  . [DISCONTINUED] nabumetone (RELAFEN) 750 MG tablet    No facility-administered encounter medications on file as of 07/13/2016.   :  Review of Systems:  Out of a complete 14 point review of systems, all are reviewed and negative with the exception of these symptoms as listed below: Review of Systems  Neurological:       Patient reports no trouble with CPAP. No new concerns.     Objective:  Neurologic Exam  Physical Exam Physical Examination:   Vitals:   07/13/16 1449  BP: (!) 146/93  Pulse: (!) 57  Resp: 16   General Examination: The patient is a very  pleasant 50 y42. male in no acute distress. He appears well-developed and well-nourished and well groomed. He is in good spirits today.  HEENT: Normocephalic, atraumatic, pupils are equal, round and reactive to light and accommodation. Extraocular tracking is good without limitation to gaze excursion or nystagmus noted. Normal smooth pursuit is noted. Hearing is grossly intact. Face is symmetric with normal facial animation and normal facial sensation. Speech is clear with no dysarthria noted. There is no hypophonia. There is no lip, neck/head, jaw or voice tremor. Neck is supple with full range of passive and active motion. There are no carotid bruits on auscultation. Oropharynx exam reveals: mild mouth dryness, good dental hygiene and moderate airway crowding, due to redundant soft palate, large uvula, tonsils of 1+ on the left and 2+ on the right. Mallampati is class II. Tongue protrudes centrally and palate elevates symmetrically. There is mild pharyngeal erythema, again noted today.   Chest: Clear to auscultation without wheezing, rhonchi or crackles noted.  Heart: S1+S2+0, regular and normal without murmurs, rubs or gallops noted.   Abdomen: Soft, non-tender and non-distended with normal bowel sounds appreciated on auscultation.  Extremities: There is no pitting edema in the distal lower extremities bilaterally. Pedal pulses are intact.  Skin: Warm and dry without trophic changes noted. There are no varicose veins.  Musculoskeletal: exam reveals no obvious joint deformities, tenderness or joint swelling or erythema, with the exception of mild discomfort in his knees, wears elastic copper braces both knees.   Neurologically:  Mental status: The patient is awake, alert and oriented in all 4 spheres. His immediate and remote memory, attention, language skills and fund of knowledge are appropriate. There is no evidence of aphasia, agnosia, apraxia or anomia. Speech is clear with normal prosody and  enunciation. Thought process is linear. Mood is normal and affect is normal.  Cranial nerves II - XII are as described above under HEENT exam. In addition: shoulder shrug is normal with equal shoulder height noted. Motor exam: Normal bulk, strength and tone is noted. There is no drift, tremor or rebound. Romberg is negative. Reflexes are 2+ throughout. Fine motor skills and coordination: intact with normal finger taps, normal hand movements, normal rapid alternating  patting, normal foot taps and normal foot agility.  Cerebellar testing: No dysmetria or intention tremor on finger to nose testing. Heel to shin is unremarkable bilaterally. There is no truncal or gait ataxia.  Sensory exam: intact to light touch in the upper and lower extremities.  Gait, station and balance: He stands easily. No veering to one side is noted. No leaning to one side is noted. Posture is age-appropriate and stance is narrow based. Gait shows normal stride length and normal pace. No problems turning are noted. He turns en bloc. Tandem walk is unremarkable.   Assessment and Plan:  In summary, LOGIN MUCKLEROY is a very pleasant 50 year old male with an underlying medical history of hyperlipidemia, hypertension, arthritis, borderline diabetes and morbid obesity, who was previously diagnosed with severe obstructive sleep apnea and placed on CPAP therapy. He was compliant with CPAP therapy for 10 years but since his weight loss surgery in November 2015 he lost over 100 pounds. He had a diagnostic sleep study in November 2016 and we talked about the test results in detail today. He has been on AutoPap therapy, sleep apnea overall is mild currently but he still had some desaturations into the 80s and significant sleep disruption during his diagnostic sleep test. He tolerates AutoPap therapy and is compliant with it. He is commended for this. Physical exam is stable. I asked him to continue AutoPap therapy. Since he is doing well, I  suggested a one-year checkup, sooner as needed. I answered all their questions today and the patient was in agreement.  I spent 25 minutes in total face-to-face time with the patient, more than 50% of which was spent in counseling and coordination of care, reviewing test results, reviewing medication and discussing or reviewing the diagnosis of OSA, its prognosis and treatment options.

## 2016-07-13 NOTE — Patient Instructions (Signed)

## 2016-08-11 ENCOUNTER — Other Ambulatory Visit: Payer: Self-pay | Admitting: Family Medicine

## 2016-09-30 ENCOUNTER — Other Ambulatory Visit: Payer: Federal, State, Local not specified - PPO

## 2016-09-30 DIAGNOSIS — I10 Essential (primary) hypertension: Secondary | ICD-10-CM

## 2016-09-30 DIAGNOSIS — E7800 Pure hypercholesterolemia, unspecified: Secondary | ICD-10-CM | POA: Diagnosis not present

## 2016-09-30 DIAGNOSIS — R7309 Other abnormal glucose: Secondary | ICD-10-CM | POA: Diagnosis not present

## 2016-09-30 DIAGNOSIS — Z125 Encounter for screening for malignant neoplasm of prostate: Secondary | ICD-10-CM

## 2016-09-30 LAB — CBC WITH DIFFERENTIAL/PLATELET
BASOS PCT: 0 %
Basophils Absolute: 0 cells/uL (ref 0–200)
EOS PCT: 2 %
Eosinophils Absolute: 124 cells/uL (ref 15–500)
HCT: 45.3 % (ref 38.5–50.0)
Hemoglobin: 14.7 g/dL (ref 13.0–17.0)
LYMPHS PCT: 40 %
Lymphs Abs: 2480 cells/uL (ref 850–3900)
MCH: 29.6 pg (ref 27.0–33.0)
MCHC: 32.5 g/dL (ref 32.0–36.0)
MCV: 91.3 fL (ref 80.0–100.0)
MONO ABS: 558 {cells}/uL (ref 200–950)
MONOS PCT: 9 %
MPV: 10.4 fL (ref 7.5–12.5)
Neutro Abs: 3038 cells/uL (ref 1500–7800)
Neutrophils Relative %: 49 %
PLATELETS: 305 10*3/uL (ref 140–400)
RBC: 4.96 MIL/uL (ref 4.20–5.80)
RDW: 14.4 % (ref 11.0–15.0)
WBC: 6.2 10*3/uL (ref 3.8–10.8)

## 2016-09-30 LAB — PSA: PSA: 0.5 ng/mL (ref <=4.0)

## 2016-10-01 LAB — COMPREHENSIVE METABOLIC PANEL
ALK PHOS: 75 U/L (ref 40–115)
ALT: 25 U/L (ref 9–46)
AST: 28 U/L (ref 10–35)
Albumin: 3.9 g/dL (ref 3.6–5.1)
BILIRUBIN TOTAL: 0.5 mg/dL (ref 0.2–1.2)
BUN: 25 mg/dL (ref 7–25)
CO2: 29 mmol/L (ref 20–31)
Calcium: 9.3 mg/dL (ref 8.6–10.3)
Chloride: 103 mmol/L (ref 98–110)
Creat: 0.74 mg/dL (ref 0.70–1.33)
GLUCOSE: 85 mg/dL (ref 70–99)
Potassium: 4.3 mmol/L (ref 3.5–5.3)
SODIUM: 140 mmol/L (ref 135–146)
Total Protein: 6.1 g/dL (ref 6.1–8.1)

## 2016-10-01 LAB — LIPID PANEL
Cholesterol: 150 mg/dL (ref 125–200)
HDL: 60 mg/dL (ref >=40)
LDL CALC: 82 mg/dL (ref <130)
Total CHOL/HDL Ratio: 2.5 Ratio (ref <=5.0)
Triglycerides: 38 mg/dL (ref <150)
VLDL: 8 mg/dL (ref <30)

## 2016-10-01 LAB — HEMOGLOBIN A1C
HEMOGLOBIN A1C: 5.2 % (ref <5.7)
Mean Plasma Glucose: 103 mg/dL

## 2016-10-07 ENCOUNTER — Ambulatory Visit (INDEPENDENT_AMBULATORY_CARE_PROVIDER_SITE_OTHER): Payer: Federal, State, Local not specified - PPO | Admitting: Family Medicine

## 2016-10-07 ENCOUNTER — Encounter: Payer: Self-pay | Admitting: Dietician

## 2016-10-07 ENCOUNTER — Encounter: Payer: Self-pay | Admitting: Family Medicine

## 2016-10-07 ENCOUNTER — Encounter: Payer: Federal, State, Local not specified - PPO | Attending: General Surgery | Admitting: Dietician

## 2016-10-07 VITALS — BP 134/72 | HR 66 | Temp 98.2°F | Resp 16 | Ht 65.0 in | Wt 216.0 lb

## 2016-10-07 DIAGNOSIS — Z0000 Encounter for general adult medical examination without abnormal findings: Secondary | ICD-10-CM

## 2016-10-07 DIAGNOSIS — E785 Hyperlipidemia, unspecified: Secondary | ICD-10-CM | POA: Diagnosis not present

## 2016-10-07 DIAGNOSIS — G4733 Obstructive sleep apnea (adult) (pediatric): Secondary | ICD-10-CM | POA: Diagnosis not present

## 2016-10-07 DIAGNOSIS — Z23 Encounter for immunization: Secondary | ICD-10-CM

## 2016-10-07 DIAGNOSIS — Z6835 Body mass index (BMI) 35.0-35.9, adult: Secondary | ICD-10-CM | POA: Insufficient documentation

## 2016-10-07 DIAGNOSIS — E119 Type 2 diabetes mellitus without complications: Secondary | ICD-10-CM | POA: Diagnosis not present

## 2016-10-07 NOTE — Patient Instructions (Addendum)
  Surgery date: 11/05/2014 Surgery type: RYGB Start weight at [facility]: 344.5 on 07/25/14 (356 lbs per patient) Weight today: 213.2 lbs Weight change: 3.8 lbs loss Total weight lost: 145 lbs  TANITA  BODY COMP RESULTS  pre op  11/19/14 01/01/15 04/08/15 10/08/15 04/07/16 10/07/16   BMI (kg/m^2) 56 50.9 46.3 38.6 34.9 36 35.5   Fat Mass (lbs) 175.5 152.5 114.5 63.5 58 67 62.8   Fat Free Mass (lbs) 161 153.5 164 168.5 152 149.5 150.4   Total Body Water (lbs) 118 112.5 120 123.5 111.5 109.5 108.6

## 2016-10-07 NOTE — Addendum Note (Signed)
Addended by: Shary Decamp B on: 10/07/2016 09:36 AM   Modules accepted: Orders

## 2016-10-07 NOTE — Progress Notes (Signed)
  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  Brian Newton returns today having lost 3 pounds in the last 6 months. Labwork (lipid panel and A1c) is great! Patient reports that his weight has been stable (fluctuates 5-10 lbs). Sometimes has a hard time tolerating chicken. Very aware of fullness cues. Incorporating some different foods in his diet. Migraines have improved.   "I can live a normal life now. It used to be a struggle just to live."  Surgery date: 11/05/2014 Surgery type: RYGB Start weight at [facility]: 344.5 on 07/25/14 (356 lbs per patient) Weight today: 213.2 lbs Weight change: 3.8 lbs loss Total weight lost: 145 lbs  TANITA  BODY COMP RESULTS  pre op  11/19/14 01/01/15 04/08/15 10/08/15 04/07/16 10/07/16   BMI (kg/m^2) 56 50.9 46.3 38.6 34.9 36 35.5   Fat Mass (lbs) 175.5 152.5 114.5 63.5 58 67 62.8   Fat Free Mass (lbs) 161 153.5 164 168.5 152 149.5 150.4   Total Body Water (lbs) 118 112.5 120 123.5 111.5 109.5 108.6    Preferred Learning Style:   No preference indicated   Learning Readiness:   Ready  24-hr recall: B (3AM): premier protein shake  3:30 AM: sausage on dinner roll and deviled egg Snk (7:30AM):  [place] yogurt (12g) L (11:30AM): 1/2 sandwich on high protein bread, cheese stick D (3-4PM):  Meat and vegetable Snk (7PM): Atkins or [employer] shake or bar   Fluid intake: water and ice, protein shakes, unsweet tea (64+ oz) Estimated total protein intake: 90+ g per day  Medications: off 1 BP pill and no longer on cholesterol meds Supplementation: taking  Drinking while eating: no Hair loss: unknown Carbonated beverages: no N/V/D/C: still having some gas with bowel movements and sometimes has "runny" stools Dumping syndrome: none  Recent physical activity:  More active overall  Progress Towards Goal(s):  In  progress.    Nutritional Diagnosis:  [place]-3.3 Overweight/obesity related to past poor dietary habits and physical inactivity as evidenced by patient w/ recent RYGB surgery following dietary guidelines for continued weight loss.     Intervention:  Nutrition counseling provided.  Teaching Method Utilized:  Visual Auditory  Barriers to learning/adherence to lifestyle change: none  Demonstrated degree of understanding via:  Teach Back   Monitoring/Evaluation:  Dietary intake, exercise, and body weight. Follow up prn.

## 2016-10-07 NOTE — Progress Notes (Signed)
Subjective:    Patient ID: Brian Newton, male    DOB: 04-30-1966, 50 y.o.   MRN: [id number]  HPI Patient is here today for complete physical exam. His due for his flu shot. Colonoscopy was performed in 2014. He is not due again until 2019. His PSA was recently checked and is virtually undetectable at 0.5. Overall he has been doing well with no concerns. He did have an episode this weekend of slightly elevated resting heart rate approaching 90. Typically his heart rate is in the 60s. However he was asymptomatic. He denies any chest pain shortness of breath dyspnea on exertion syncope or presyncope. He is virtually cured himself for metabolic syndrome since his gastric bypass. His fasting blood sugar and his hemoglobin A1c are back to normal. His liver function test are normal. His cholesterol panel is outstanding. Please see his lab work listed below : Lab on 09/30/2016  Component Date Value Ref Range Status  . WBC 09/30/2016 6.2  3.8 - 10.8 K/uL Final  . RBC 09/30/2016 4.96  4.20 - 5.80 MIL/uL Final  . Hemoglobin 09/30/2016 14.7  13.0 - 17.0 g/dL Final  . HCT 09/30/2016 45.3  38.5 - 50.0 % Final  . MCV 09/30/2016 91.3  80.0 - 100.0 fL Final  . MCH 09/30/2016 29.6  27.0 - 33.0 pg Final  . MCHC 09/30/2016 32.5  32.0 - 36.0 g/dL Final  . RDW 09/30/2016 14.4  11.0 - 15.0 % Final  . Platelets 09/30/2016 305  140 - 400 K/uL Final  . MPV 09/30/2016 10.4  7.5 - 12.5 fL Final  . Neutro Abs 09/30/2016 3038  1,500 - 7,800 cells/uL Final  . Lymphs Abs 09/30/2016 2480  850 - 3,900 cells/uL Final  . Monocytes Absolute 09/30/2016 558  200 - 950 cells/uL Final  . Eosinophils Absolute 09/30/2016 124  15 - 500 cells/uL Final  . Basophils Absolute 09/30/2016 0  0 - 200 cells/uL Final  . Neutrophils Relative % 09/30/2016 49  % Final  . Lymphocytes Relative 09/30/2016 40  % Final  . Monocytes Relative 09/30/2016 9  % Final  . Eosinophils Relative 09/30/2016 2  % Final  . Basophils Relative 09/30/2016 0   % Final  . Smear Review 09/30/2016 Criteria for review not met   Final  . Sodium 10/01/2016 140  135 - 146 mmol/L Final  . Potassium 10/01/2016 4.3  3.5 - 5.3 mmol/L Final  . Chloride 10/01/2016 103  98 - 110 mmol/L Final  . CO2 10/01/2016 29  20 - 31 mmol/L Final  . Glucose, Bld 10/01/2016 85  70 - 99 mg/dL Final  . BUN 10/01/2016 25  7 - 25 mg/dL Final  . Creat 10/01/2016 0.74  0.70 - 1.33 mg/dL Final   Comment:   For patients > or = 52 years of age: The upper reference limit for Creatinine is approximately 13% higher for people identified as African-American.     . Total Bilirubin 10/01/2016 0.5  0.2 - 1.2 mg/dL Final  . Alkaline Phosphatase 10/01/2016 75  40 - 115 U/L Final  . AST 10/01/2016 28  10 - 35 U/L Final  . ALT 10/01/2016 25  9 - 46 U/L Final  . Total Protein 10/01/2016 6.1  6.1 - 8.1 g/dL Final  . Albumin 10/01/2016 3.9  3.6 - 5.1 g/dL Final  . Calcium 10/01/2016 9.3  8.6 - 10.3 mg/dL Final  . Cholesterol 10/01/2016 150  125 - 200 mg/dL Final  . Triglycerides 10/01/2016 38  <  150 mg/dL Final  . HDL 10/01/2016 60  >=40 mg/dL Final  . Total CHOL/HDL Ratio 10/01/2016 2.5  <=5.0 Ratio Final  . VLDL 10/01/2016 8  <30 mg/dL Final  . LDL Cholesterol 10/01/2016 82  <130 mg/dL Final   Comment:   Total Cholesterol/HDL Ratio:CHD Risk                        Coronary Heart Disease Risk Table                                        Men       Women          1/2 Average Risk              3.4        3.3              Average Risk              5.0        4.4           2X Average Risk              9.6        7.1           3X Average Risk             23.4       11.0 Use the calculated Patient Ratio above and the CHD Risk table  to determine the patient's CHD Risk.   . Hgb A1c MFr Bld 10/01/2016 5.2  <5.7 % Final   Comment:   For the purpose of screening for the presence of diabetes:   <5.7%       Consistent with the absence of diabetes 5.7-6.4 %   Consistent with increased risk for  diabetes (prediabetes) >=6.5 %     Consistent with diabetes   This assay result is consistent with a decreased risk of diabetes.   Currently, no consensus exists regarding use of hemoglobin A1c for diagnosis of diabetes in children.   According to American Diabetes Association (ADA) guidelines, hemoglobin A1c <7.0% represents optimal control in non-pregnant diabetic patients. Different metrics may apply to specific patient populations. Standards of Medical Care in Diabetes (ADA).     . Mean Plasma Glucose 10/01/2016 103  mg/dL Final  . PSA 09/30/2016 0.5  <=4.0 ng/mL Final   Comment:   The total PSA value from this assay system is standardized against the WHO standard. The test result will be approximately 20% lower when compared to the equimolar-standardized total PSA (Beckman Coulter). Comparison of serial PSA results should be interpreted with this fact in mind.   This test was performed using the Siemens chemiluminescent method. Values obtained from different assay methods cannot be used interchangeably. PSA levels, regardless of value, should not be interpreted as absolute evidence of the presence or absence of disease.   Effective July 26, 2016, Total PSA is being tested on the Siemens Centaur XP using chemiluminescence methodology. Re-baseline testing will be available until October 25, 2016 at no charge. If you have a patient that may require re-baselining, please order [id number] in addition to [id number].      Past Medical History:  Diagnosis Date  . Arthritis   . Borderline hyperglycemia   . Headache(784.0)   . Hypercholesteremia   . Hypertension   .  Metabolic syndrome   . Shingles   . Sleep apnea    uses C PAP  . Swelling    both legs  . 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 Wrist pain    Past Surgical History:  Procedure Laterality Date  . APPENDECTOMY  1986  . BREATH TEK H PYLORI N/A 08/08/2014   Procedure: BREATH TEK H PYLORI;   Surgeon: Edward Jolly, MD;  Location: [facility];  Service: General;  Laterality: N/A;  . COLONOSCOPY N/A 03/02/2013   Procedure: COLONOSCOPY;  Surgeon: Beryle Beams, MD;  Location: [facility];  Service: Endoscopy;  Laterality: N/A;  . 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 KNEE ARTHROSCOPY  2001   rt knee  . UPPER GI ENDOSCOPY  11/05/2014   Procedure: UPPER GI ENDOSCOPY;  Surgeon: Excell Seltzer, MD;  Location: [facility];  Service: General;;   Current Outpatient Prescriptions on File Prior to Visit  Medication Sig Dispense Refill  . amLODipine-benazepril (LOTREL) 5-20 MG capsule TAKE (2) CAPSULES BY MOUTH ONCE EVERY MORNING. 60 capsule 11  . Cyanocobalamin 500 MCG/0.1ML SOLN Place 500 mcg into the nose once a week.    . loratadine (CLARITIN) 10 MG tablet Take 10 mg by mouth every morning.    . Multiple Vitamins-Minerals (MULTIVITAMIN WITH MINERALS) tablet Take 1 tablet by mouth every morning.     Marland Kitchen OVER THE COUNTER MEDICATION Calcium - 691m - vitamin d 1500units - magnesium - 1222m- 2 tablets bid    . OVER THE COUNTER MEDICATION Iron - 548m vitamn c 30m27m1 qam - 2 pm     No current facility-administered medications on file prior to visit.    Allergies  Allergen Reactions  . Penicillins Rash   Social History   Social History  . Marital status: Married    Spouse name: N/A  . Number of children: 2  . Years of education: HS   Occupational History  . Not on file.   Social History Main Topics  . Smoking status: Never Smoker  . Smokeless tobacco: Never Used  . Alcohol use No  . Drug use: No  . Sexual activity: Yes   Other Topics Concern  . Not on file   Social History Narrative   Denies caffeine use    Family History  Problem Relation Age of Onset  . Hypertension Other   . Hyperlipidemia Other     . Heart disease Other   . Obesity Other   . Sleep apnea Other   . Hypertension Mother   . Breast cancer Maternal Grandmother   . Colon cancer Maternal Grandfather       Review of Systems  All other systems reviewed and are negative.      Objective:   Physical Exam  Constitutional: He is oriented to person, place, and time. He appears well-developed and well-nourished. No distress.  HENT:  Head: Normocephalic and atraumatic.  Right Ear: External ear normal.  Left Ear: External ear normal.  Nose: Nose normal.  Mouth/Throat: Oropharynx is clear and moist. No oropharyngeal exudate.  Eyes: Conjunctivae and EOM are normal. Pupils are equal, round, and reactive to light. Right eye exhibits no discharge. Left eye exhibits no discharge. No scleral icterus.  Neck: Normal range of motion. Neck supple. No JVD present. No tracheal deviation present. No thyromegaly present.  Cardiovascular:  Normal rate, regular rhythm, normal heart sounds and intact distal pulses.  Exam reveals no gallop and no friction rub.   No murmur heard. Pulmonary/Chest: Effort normal and breath sounds normal. No stridor. No respiratory distress. He has no wheezes. He has no rales. He exhibits no tenderness.  Abdominal: Soft. Bowel sounds are normal. He exhibits no distension and no mass. There is no tenderness. There is no rebound and no guarding.  Musculoskeletal: Normal range of motion. He exhibits no edema, tenderness or deformity.  Lymphadenopathy:    He has no cervical adenopathy.  Neurological: He is alert and oriented to person, place, and time. He has normal reflexes. He displays normal reflexes. No cranial nerve deficit. He exhibits normal muscle tone. Coordination normal.  Skin: Skin is warm. No rash noted. He is not diaphoretic. No erythema. No pallor.  Psychiatric: He has a normal mood and affect. His behavior is normal. Judgment and thought content normal.  Vitals reviewed.         Assessment &  Plan:  Routine general medical examination at a health care facility Blood work is outstanding. Physical exam is normal. Patient received his flu shot. Cancer screening is up-to-date. I did recommend the patient increase his aerobic exercise to 30 minutes a day 5 days a week for cardiovascular health. Otherwise regular anticipatory guidance is provided. Follow-up in 6 months or as needed

## 2016-10-13 DIAGNOSIS — K080 Exfoliation of teeth due to systemic causes: Secondary | ICD-10-CM | POA: Diagnosis not present

## 2016-12-09 DIAGNOSIS — Z9884 Bariatric surgery status: Secondary | ICD-10-CM | POA: Diagnosis not present

## 2016-12-27 DIAGNOSIS — Z9884 Bariatric surgery status: Secondary | ICD-10-CM | POA: Diagnosis not present

## 2017-01-25 DIAGNOSIS — H40033 Anatomical narrow angle, bilateral: Secondary | ICD-10-CM | POA: Diagnosis not present

## 2017-01-28 ENCOUNTER — Telehealth: Payer: Self-pay | Admitting: Family Medicine

## 2017-01-28 ENCOUNTER — Encounter: Payer: Self-pay | Admitting: Family Medicine

## 2017-01-28 MED ORDER — OSELTAMIVIR PHOSPHATE 75 MG PO CAPS: 75.0000 mg | ORAL_CAPSULE | Freq: Two times a day (BID) | ORAL | 0 refills | Status: DC

## 2017-01-28 NOTE — Telephone Encounter (Signed)
Pt states that yesterday he started with fever of 100.9 and horrible body aches along with some coughing and was wanting to know if we could call him in something? Per [facility] ok to send in Tamiflu - med sent to pharm. Also needs note for work - note done, signed and left up front.

## 2017-01-28 NOTE — Telephone Encounter (Signed)
Patient is calling saying he has symptoms of the flu, body aches and fever  [employer]

## 2017-01-31 ENCOUNTER — Encounter: Payer: Self-pay | Admitting: Family Medicine

## 2017-02-24 DIAGNOSIS — H40053 Ocular hypertension, bilateral: Secondary | ICD-10-CM | POA: Diagnosis not present

## 2017-03-31 ENCOUNTER — Other Ambulatory Visit: Payer: Federal, State, Local not specified - PPO

## 2017-04-07 ENCOUNTER — Ambulatory Visit: Payer: Federal, State, Local not specified - PPO | Admitting: Family Medicine

## 2017-04-26 ENCOUNTER — Other Ambulatory Visit: Payer: Self-pay | Admitting: Family Medicine

## 2017-04-26 ENCOUNTER — Other Ambulatory Visit: Payer: Federal, State, Local not specified - PPO

## 2017-04-26 DIAGNOSIS — Z79899 Other long term (current) drug therapy: Secondary | ICD-10-CM

## 2017-04-26 DIAGNOSIS — E669 Obesity, unspecified: Secondary | ICD-10-CM | POA: Diagnosis not present

## 2017-04-26 DIAGNOSIS — I10 Essential (primary) hypertension: Secondary | ICD-10-CM

## 2017-04-26 DIAGNOSIS — E7800 Pure hypercholesterolemia, unspecified: Secondary | ICD-10-CM

## 2017-04-26 DIAGNOSIS — Z6834 Body mass index (BMI) 34.0-34.9, adult: Secondary | ICD-10-CM

## 2017-04-26 DIAGNOSIS — E8881 Metabolic syndrome: Secondary | ICD-10-CM

## 2017-04-26 LAB — COMPLETE METABOLIC PANEL WITH GFR
ALBUMIN: 4 g/dL (ref 3.6–5.1)
ALT: 18 U/L (ref 9–46)
AST: 22 U/L (ref 10–35)
Alkaline Phosphatase: 77 U/L (ref 40–115)
BUN: 25 mg/dL (ref 7–25)
CALCIUM: 9 mg/dL (ref 8.6–10.3)
CHLORIDE: 107 mmol/L (ref 98–110)
CO2: 22 mmol/L (ref 20–31)
CREATININE: 0.87 mg/dL (ref 0.70–1.33)
GFR, Est African American: >89 mL/min (ref >=60)
GFR, Est Non African American: >89 mL/min (ref >=60)
Glucose, Bld: 87 mg/dL (ref 70–99)
Potassium: 4.5 mmol/L (ref 3.5–5.3)
Sodium: 143 mmol/L (ref 135–146)
Total Bilirubin: 0.5 mg/dL (ref 0.2–1.2)
Total Protein: 6.2 g/dL (ref 6.1–8.1)

## 2017-04-26 LAB — LIPID PANEL
CHOL/HDL RATIO: 2.4 ratio (ref <5.0)
CHOLESTEROL: 141 mg/dL (ref <200)
HDL: 58 mg/dL (ref >40)
LDL Cholesterol: 75 mg/dL (ref <100)
TRIGLYCERIDES: 38 mg/dL (ref <150)
VLDL: 8 mg/dL (ref <30)

## 2017-04-26 LAB — TSH: TSH: 1.17 mIU/L (ref 0.40–4.50)

## 2017-04-27 DIAGNOSIS — K080 Exfoliation of teeth due to systemic causes: Secondary | ICD-10-CM | POA: Diagnosis not present

## 2017-04-27 LAB — HEMOGLOBIN A1C
HEMOGLOBIN A1C: 5.2 % (ref <5.7)
Mean Plasma Glucose: 103 mg/dL

## 2017-04-28 ENCOUNTER — Encounter: Payer: Self-pay | Admitting: Family Medicine

## 2017-04-28 ENCOUNTER — Ambulatory Visit (INDEPENDENT_AMBULATORY_CARE_PROVIDER_SITE_OTHER): Payer: Federal, State, Local not specified - PPO | Admitting: Family Medicine

## 2017-04-28 VITALS — BP 142/90 | HR 74 | Temp 97.7°F | Resp 16 | Ht 65.0 in | Wt 218.0 lb

## 2017-04-28 DIAGNOSIS — I10 Essential (primary) hypertension: Secondary | ICD-10-CM

## 2017-04-28 DIAGNOSIS — E8881 Metabolic syndrome: Secondary | ICD-10-CM | POA: Diagnosis not present

## 2017-04-28 NOTE — Progress Notes (Signed)
Subjective:    Patient ID: Brian Newton, male    DOB: 1966/11/28, 51 y.o.   MRN: [id number]  HPI  Here today for follow-up of his hypertension hyperlipidemia and metabolic syndrome.. The patient continues to thrive. His weight remains stable. Last office visit he was 216. Today he is 218 pounds. He denies any chest pain shortness of breath or dyspnea on exertion. He denies any myalgias or right upper quadrant pain. Blood pressure today is slightly elevated. He is not checking his blood pressure at home. His most recent lab work as listed below: Appointment on 04/26/2017  Component Date Value Ref Range Status  . Sodium 04/26/2017 143  135 - 146 mmol/L Final  . Potassium 04/26/2017 4.5  3.5 - 5.3 mmol/L Final  . Chloride 04/26/2017 107  98 - 110 mmol/L Final  . CO2 04/26/2017 22  20 - 31 mmol/L Final  . Glucose, Bld 04/26/2017 87  70 - 99 mg/dL Final  . BUN 04/26/2017 25  7 - 25 mg/dL Final  . Creat 04/26/2017 0.87  0.70 - 1.33 mg/dL Final   Comment:   For patients > or = 53 years of age: The upper reference limit for Creatinine is approximately 13% higher for people identified as African-American.     . Total Bilirubin 04/26/2017 0.5  0.2 - 1.2 mg/dL Final  . Alkaline Phosphatase 04/26/2017 77  40 - 115 U/L Final  . AST 04/26/2017 22  10 - 35 U/L Final  . ALT 04/26/2017 18  9 - 46 U/L Final  . Total Protein 04/26/2017 6.2  6.1 - 8.1 g/dL Final  . Albumin 04/26/2017 4.0  3.6 - 5.1 g/dL Final  . Calcium 04/26/2017 9.0  8.6 - 10.3 mg/dL Final  . GFR, Est African American 04/26/2017 >89  >=60 mL/min Final  . GFR, Est Non African American 04/26/2017 >89  >=60 mL/min Final  . TSH 04/26/2017 1.17  0.40 - 4.50 mIU/L Final  . Cholesterol 04/26/2017 141  <200 mg/dL Final  . Triglycerides 04/26/2017 38  <150 mg/dL Final  . HDL 04/26/2017 58  >40 mg/dL Final  . Total CHOL/HDL Ratio 04/26/2017 2.4  <5.0 Ratio Final  . VLDL 04/26/2017 8  <30 mg/dL Final  . LDL Cholesterol 04/26/2017 75   <100 mg/dL Final  . Hgb A1c MFr Bld 04/26/2017 5.2  <5.7 % Final   Comment:   For the purpose of screening for the presence of diabetes:   <5.7%       Consistent with the absence of diabetes 5.7-6.4 %   Consistent with increased risk for diabetes (prediabetes) >=6.5 %     Consistent with diabetes   This assay result is consistent with a decreased risk of diabetes.   Currently, no consensus exists regarding use of hemoglobin A1c for diagnosis of diabetes in children.   According to American Diabetes Association (ADA) guidelines, hemoglobin A1c <7.0% represents optimal control in non-pregnant diabetic patients. Different metrics may apply to specific patient populations. Standards of Medical Care in Diabetes (ADA).     . Mean Plasma Glucose 04/26/2017 103  mg/dL Final    Past Medical History:  Diagnosis Date  . Arthritis   . Borderline hyperglycemia   . Headache(784.0)   . Hypercholesteremia   . Hypertension   . Metabolic syndrome   . Shingles   . Sleep apnea    uses C PAP  . Swelling    both legs  . 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 Wrist  pain    Current Outpatient Prescriptions on File Prior to Visit  Medication Sig Dispense Refill  . amLODipine-benazepril (LOTREL) 5-20 MG capsule TAKE (2) CAPSULES BY MOUTH ONCE EVERY MORNING. 60 capsule 11  . Cyanocobalamin 500 MCG/0.1ML SOLN Place 500 mcg into the nose once a week.    . loratadine (CLARITIN) 10 MG tablet Take 10 mg by mouth every morning.    . Multiple Vitamins-Minerals (MULTIVITAMIN WITH MINERALS) tablet Take 1 tablet by mouth every morning.     Marland Kitchen oseltamivir (TAMIFLU) 75 MG capsule Take 1 capsule (75 mg total) by mouth 2 (two) times daily. 10 capsule 0  . OVER THE COUNTER MEDICATION Calcium - 600mg  - vitamin d 1500units - magnesium - 120mg  - 2 tablets bid    . OVER THE COUNTER MEDICATION Iron - 54mg  - vitamn c 90mg  - 1 qam - 2 pm     No current facility-administered medications on file prior  to visit.    Allergies  Allergen Reactions  . Penicillins Rash   Social History   Social History  . Marital status: Married    Spouse name: N/A  . Number of children: 2  . Years of education: HS   Occupational History  . Not on file.   Social History Main Topics  . Smoking status: Never Smoker  . Smokeless tobacco: Never Used  . Alcohol use No  . Drug use: No  . Sexual activity: Yes   Other Topics Concern  . Not on file   Social History Narrative   Denies caffeine use       Review of Systems  All other systems reviewed and are negative.      Objective:   Physical Exam  Constitutional: He appears well-developed and well-nourished. No distress.  Neck: Neck supple. No thyromegaly present.  Cardiovascular: Normal rate, regular rhythm and normal heart sounds.  Exam reveals no gallop and no friction rub.   No murmur heard. Pulmonary/Chest: Effort normal and breath sounds normal. No respiratory distress. He has no wheezes. He has no rales.  Abdominal: Soft. Bowel sounds are normal. He exhibits no distension. There is no tenderness. There is no rebound and no guarding.  Lymphadenopathy:    He has no cervical adenopathy.  Skin: He is not diaphoretic.  Vitals reviewed.       Assessment & Plan:  Hypertension, metabolic syndrome With weight loss, the patient has cured his metabolic syndrome. Therefore mainly just checking him for hypertension. I no longer see the utility in checking his blood work every 6 months. I have recommended resuming annual screening. I've asked the patient check his blood pressure frequently given the fact it was slightly elevated today and notify me of the values. I continue to congratulated him on his weight loss.

## 2017-05-24 ENCOUNTER — Ambulatory Visit (INDEPENDENT_AMBULATORY_CARE_PROVIDER_SITE_OTHER): Payer: Federal, State, Local not specified - PPO | Admitting: Family Medicine

## 2017-05-24 ENCOUNTER — Encounter: Payer: Self-pay | Admitting: Family Medicine

## 2017-05-24 VITALS — BP 140/82 | HR 88 | Temp 97.9°F | Resp 14 | Ht 65.0 in | Wt 225.0 lb

## 2017-05-24 DIAGNOSIS — R079 Chest pain, unspecified: Secondary | ICD-10-CM | POA: Diagnosis not present

## 2017-05-24 NOTE — Patient Instructions (Signed)
F/U as needed

## 2017-05-24 NOTE — Progress Notes (Signed)
   Subjective:    Patient ID: Brian Newton, male    DOB: Oct 02, 1966, 51 y.o.   MRN: [id number]  Patient presents for Chest Pain (x8 days- L sided chest pain- denies radiating pain- thinks he pulled a msucle)  Patient here with chest pain. He was seen last month for his routine follow-up he was maintaining weight loss and also taking his blood pressure medicines as described. His blood pressure was mildly elevated at that visit 142/90. For the past week however he has had chest pain on and off. Started last Tuesday , he feels it when he presses on the chest. 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 No recent illness. Took Mylanta no change in symptoms, took ibuprofen and this relieved pain.   Bariatric - 2015   Review Of Systems:  GEN- denies fatigue, fever, weight loss,weakness, recent illness HEENT- denies eye drainage, change in vision, nasal discharge, CVS- +chest pain, palpitations RESP- denies SOB, cough, wheeze ABD- denies N/V, change in stools, abd pain GU- denies dysuria, hematuria, dribbling, incontinence MSK- denies joint pain, muscle aches, injury Neuro- denies headache, dizziness, syncope, seizure activity       Objective:    BP 140/82   Pulse 88   Temp 97.9 F (36.6 C) (Oral)   Resp 14   Ht 5\' 5"  (1.651 m)   Wt 225 lb (102.1 kg)   SpO2 99%   BMI 37.44 kg/m  GEN- NAD, alert and oriented x3 HEENT- PERRL, EOMI, non injected sclera, pink conjunctiva, MMM, oropharynx clear CVS- RRR, no murmur RESP-CTAB Chest wall- TTP left chest wall adjancent to sternum ABD-NABS,soft,NT,ND EXT- No edema Pulses- Radial, DP- 2+  EKG-NSR   REVIEWED labs from a few weeks ago    Assessment & Plan:      Problem List Items Addressed This Visit    None    Visit Diagnoses    Chest pain, unspecified type    -  Primary   EKG looks good, more MSK  pain based on history and exam. Will try ibuprofen BID as this helps pain for next few days, if it does not alleviate discussed this could be an aytpical presentation and I would refer him to cardiology for evaluation.   Relevant Orders   EKG 12-Lead (Completed)      Note: This dictation was prepared with Dragon dictation along with smaller phrase technology. Any transcriptional errors that result from this process are unintentional.

## 2017-06-27 ENCOUNTER — Encounter: Payer: Self-pay | Admitting: Physician Assistant

## 2017-06-27 ENCOUNTER — Ambulatory Visit (INDEPENDENT_AMBULATORY_CARE_PROVIDER_SITE_OTHER): Payer: Federal, State, Local not specified - PPO | Admitting: Physician Assistant

## 2017-06-27 VITALS — BP 134/78 | HR 77 | Temp 97.8°F | Resp 18 | Ht 65.0 in | Wt 218.4 lb

## 2017-06-27 DIAGNOSIS — G43011 Migraine without aura, intractable, with status migrainosus: Secondary | ICD-10-CM | POA: Diagnosis not present

## 2017-06-27 MED ORDER — PROMETHAZINE HCL 25 MG/ML IJ SOLN
25.0000 mg | Freq: Once | INTRAMUSCULAR | Status: AC
  Administered 2017-06-27: 25 mg via INTRAMUSCULAR

## 2017-06-27 MED ORDER — KETOROLAC TROMETHAMINE 60 MG/2ML IM SOLN
60.0000 mg | Freq: Once | INTRAMUSCULAR | Status: AC
  Administered 2017-06-27: 60 mg via INTRAMUSCULAR

## 2017-06-27 MED ORDER — PROMETHAZINE HCL 25 MG PO TABS: 25.0000 mg | ORAL_TABLET | Freq: Once | ORAL | Status: DC

## 2017-06-27 NOTE — Progress Notes (Signed)
Patient ID: Brian Newton MRN: [id number], DOB: 11-May-1966, 51 y.o. Date of Encounter: 06/27/2017, 11:23 AM    Chief Complaint:  Chief Complaint  Patient presents with  . Migraine    on going for 5-6 years      HPI: 51 y.o. year old male presents with migraine. presents with migraine.   He reports that his been 5 or 6 years since he had to come in secondary to a migraine. He says that more recently that he can catch the symptoms are really when it starts and he takes an Excedrin Migraine and symptoms resolved. However says that this migraine has persisted for 3 days so he knew he had to come in and get treatment. He has very significant photophobia. Also says that if he has movement of position then symptoms increase as well. Says that he never has had any of the visual changes that some people experience with migraines. Visit he saw a specialist for his migraines in the past and that the maintenance treatment they had prescribed this cost and him $45 a month. Says that after the bariatric surgery he was having no migraines at all for a while not even light ones. Then if these had any at all since then more recent they would just be mild and he would catch them early and take the Excedrin Migraine. However states that years ago this would happen and he knows that every time he would have to come in and get Toradol shot and that would work and migraine resolved. He is also having a little nausea. He did not drive himself here. He had his mother drive him here for visit because he knew he cannot drive if he got the shots. He was scheduled to have worked this past weekend. Missed work Saturday Sunday and today. I will go ahead and write for him to be out tomorrow in case he needs to be out the he says that he can go ahead and return tomorrow if he is feeling if symptoms do resolve later today. His migraine pain is across both sides of his forehead and upper head. Says that it feels the same as his past  migraines.     Home Meds:   Outpatient Medications Prior to Visit  Medication Sig Dispense Refill  . amLODipine-benazepril (LOTREL) 5-20 MG capsule TAKE (2) CAPSULES BY MOUTH ONCE EVERY MORNING. (Patient taking differently: TAKE 1 CAPSULE BY MOUTH ONCE EVERY MORNING.) 60 capsule 11  . Cyanocobalamin 500 MCG/0.1ML SOLN Place 500 mcg into the nose once a week.    . ferrous sulfate 325 (65 FE) MG tablet Take 325 mg by mouth daily with breakfast.    . loratadine (CLARITIN) 10 MG tablet Take 10 mg by mouth every morning.    . Multiple Vitamins-Minerals (MULTIVITAMIN WITH MINERALS) tablet Take 1 tablet by mouth every morning.     Marland Kitchen OVER THE COUNTER MEDICATION Calcium - 600mg  - vitamin d 1500units - magnesium - 120mg  - 2 tablets bid    . OVER THE COUNTER MEDICATION Iron - 54mg  - vitamn c 90mg  - 1 qam - 2 pm     No facility-administered medications prior to visit.     Allergies:  Allergies  Allergen Reactions  . Penicillins Rash      Review of Systems: See HPI for pertinent ROS. All other ROS negative.    Physical Exam: Blood pressure 134/78, pulse 77, temperature 97.8 F (36.6 C), temperature source Oral, resp. rate 18, height 5\' 5"  (1.651  m), weight 218 lb 6.4 oz (99.1 kg), SpO2 98 %., Body mass index is 36.34 kg/m. General: WNWD WM.  Appears in no acute distress. Neck: Supple. No thyromegaly. No lymphadenopathy. Lungs: Clear bilaterally to auscultation without wheezes, rales, or rhonchi. Breathing is unlabored. Heart: Regular rhythm. No murmurs, rubs, or gallops. Msk:  Strength and tone normal for age. Extremities/Skin: Warm and dry.  Neuro: Alert and oriented X 3. Moves all extremities spontaneously. Gait is normal. CNII-XII grossly in tact. Psych:  Responds to questions appropriately with a normal affect.     ASSESSMENT AND PLAN:  51 y.o. year old male with  1. Intractable migraine without aura and with status migrainosus Give Toradol 60 mg IM. Give Phenergan 25 mg  IM. Give note to cover her being out of work Saturday Sunday today and tomorrow. If his symptoms do not resolve after these injections later today than he is to call me.   Signed, [address] [place], [place], [facility] 06/27/2017 11:23 AM

## 2017-06-27 NOTE — Addendum Note (Signed)
Addended by: Vonna Kotyk A on: 06/27/2017 11:57 AM   Modules accepted: Orders

## 2017-07-18 ENCOUNTER — Telehealth: Payer: Self-pay

## 2017-07-18 NOTE — Telephone Encounter (Signed)
I called pt to remind him to bring his cpap to [facility] to be downloaded prior to his appt with Dr. Rexene Alberts tomorrow. Pt reports that he has a 30 and 90 day download and will bring it to his appt tomorrow.

## 2017-07-19 ENCOUNTER — Encounter: Payer: Self-pay | Admitting: Neurology

## 2017-07-19 ENCOUNTER — Ambulatory Visit (INDEPENDENT_AMBULATORY_CARE_PROVIDER_SITE_OTHER): Payer: Federal, State, Local not specified - PPO | Admitting: Neurology

## 2017-07-19 VITALS — BP 175/104 | HR 64 | Ht 65.0 in | Wt 222.0 lb

## 2017-07-19 DIAGNOSIS — Z9884 Bariatric surgery status: Secondary | ICD-10-CM | POA: Diagnosis not present

## 2017-07-19 DIAGNOSIS — Z9989 Dependence on other enabling machines and devices: Secondary | ICD-10-CM | POA: Diagnosis not present

## 2017-07-19 DIAGNOSIS — G4733 Obstructive sleep apnea (adult) (pediatric): Secondary | ICD-10-CM | POA: Diagnosis not present

## 2017-07-19 NOTE — Patient Instructions (Signed)
I will prescribe a new autoPAP machine for you.  Please continue using your autoPAP regularly. While your insurance requires that you use PAP at least 4 hours each night on 70% of the nights, I recommend, that you not skip any nights and use it throughout the night if you can. Getting used to PAP and staying with the treatment long term does take time and patience and discipline. Untreated obstructive sleep apnea when it is moderate to severe can have an adverse impact on cardiovascular health and raise her risk for heart disease, arrhythmias, hypertension, congestive heart failure, stroke and diabetes. Untreated obstructive sleep apnea causes sleep disruption, nonrestorative sleep, and sleep deprivation. This can have an impact on your day to day functioning and cause daytime sleepiness and impairment of cognitive function, memory loss, mood disturbance, and problems focussing. Using PAP regularly can improve these symptoms.

## 2017-07-19 NOTE — Progress Notes (Signed)
Subjective:    Patient ID: Brian Newton is a 51 y.o. male.  HPI      Interim history:  Brian Newton is a 51 year old right-handed gentleman with an underlying medical history of hyperlipidemia, migraine HAs, hypertension, arthritis, borderline diabetes and morbid obesity, who presents for follow-up consultation of his obstructive sleep apnea, on AutoPap therapy. The patient is unaccompanied today. I last saw him on 07/13/2016, at which time he was compliant with his AutoPap. We talked about his sleep study results from 11/04/2015. He reported doing well. He had some interim weight loss. He was followed by nutrition on a regular basis since his weight loss surgery. He also reported that his migraine headaches had improved. I suggested a one-year checkup.  Today, 07/19/2017 (all dictated new, as well as above notes, some dictation done in note pad or Word, outside of chart, may appear as copied):  I reviewed his AutoPAP compliance data from 06/14/2017 through 07/13/2017 which is a total of 30 days, during which time he used his machine every night with percent used days greater than 4 hours at 100%, indicating superb compliance with an average usage of 6 hours and 40 minutes, residual AHI 3 per hour, leak is on the high side with the 95th percentile at 30/L per minute, 95th percentile pressure at 10.6, range of 5-15. He reports doing well overall but his machine is old and needs replacement. It does not always work properly and turns off randomly at times. He has an older [employer]. High leak may be from running randomly, as it also turns on randomly. He reports, he just received new supplies, he does use a chinstrap, and does not notice a high leak typically. He is interested in getting the so clean CPAP cleaning machine.  The patient's allergies, current medications, family history, past medical history, past social history, past surgical history and problem list were reviewed  and updated as appropriate.   Previously (copied from previous notes for reference):   I first met him on 10/06/2015 at the request of his primary care physician, at which time he reported a prior diagnosis of OSA and treatment with CPAP but significant weight loss after his gastric bypass surgery in November 2015. I invited him back for sleep study for reevaluation. He had a baseline sleep study on 11/04/2015. I went over his test results with him in detail today. His sleep efficiency was normal at 90.4% with a sleep latency of 6.5 minutes and wake after sleep onset of 41 minutes with moderate sleep fragmentation noted. He had an increased percentage of stage II sleep, absence of slow-wave sleep and a normal percentage of REM sleep with a normal REM latency. He had no significant PLMS, EKG or EEG changes. He had mild to moderate snoring. Total AHI was mildly elevated at 9.1 per hour, rising to 14.3 per hour in the supine position and 11.7 per hour during REM sleep. Average oxygen saturation was 94%, nadir was 84%. Time below 90% saturation was 20 minutes, time below 88% saturation was 6 minutes. Based on his test results suggested treatment in the form of AutoPap at home.    Of note, he was seen in the interim by nurse practitioner, Ward Givens on 01/14/2016, at which time he reported improved sleep with AutoPap but complained of mouth dryness. A CPAP download was not obtainable at the time.   I reviewed his AutoPap compliance data from 06/01/2016 through 07/03/2016 which is a total of  30 days during which time he used his machine every day with percent used days greater than 4 hours at 96%, indicating excellent compliance with an average usage of 6 hours and 18 minutes, residual AHI 3.1 per hour, 95th percentile pressure of 9.8 cm, leak at times high with the 95th percentile at 30 L/m, minimum pressure 5 cm, maximum pressure 15 cm.   10/06/2015: He was previously diagnosed with obstructive sleep apnea  and placed on CPAP therapy. He had weight loss surgery with gastric bypass on 11/05/2014 after which she lost a significant amount of weight. He is not fully compliant with CPAP therapy at this time. I reviewed your office note from 10/02/2015. The patient presented recently with a rash and was diagnosed with shingles.   Prior sleep test results were reviewed from 2006: He had a baseline sleep study on 08/16/2005 08/16/05, which showed a total AHI of 30.6 and an oxyhemoglobin desaturation nadir of 64%, in keeping with severe obstructive sleep apnea. He had a CPAP titration study on 09/27/2005 which showed an AHI of 0.8 per hour on a pressure of 14 cm and oxyhemoglobin desaturation nadir was 87% during that study. He was always compliant with CPAP therapy until March of this year when he started having a lot of dry mouth. His machine is over 32 years old. He has not been using it since March 2016. His BMI at the time of his weight loss surgery was 52.59. He has lost over 100 pounds. He does not drink caffeine, or alcohol, and does not smoke. He works for [employer]. He denies nocturia, RLS Sx or morning HAs. His bedtime is around 7 PM. Rise time varies a little bit depending on how early he has to go to work. Usual work time is 5 AM to 1:30 PM. His usual rise time is 3 AM. His Epworth sleepiness score is 15 out of 24 today, his fatigue score is 24 out of 63. His biggest sleep-related complaint at this time is daytime sleepiness which is worse in the afternoons.   He has 2 daughters, ages 16 and 35.  His Past Medical History Is Significant For: Past Medical History:  Diagnosis Date  . Arthritis   . Borderline hyperglycemia   . Headache(784.0)   . Hypercholesteremia   . Hypertension   . Metabolic syndrome   . Shingles   . Sleep apnea    uses C PAP  . Swelling    both legs  . 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 Wrist pain     His Past Surgical History Is Significant For: Past  Surgical History:  Procedure Laterality Date  . APPENDECTOMY  1986  . BREATH TEK H PYLORI N/A 08/08/2014   Procedure: BREATH TEK H PYLORI;  Surgeon: Edward Jolly, MD;  Location: [facility];  Service: General;  Laterality: N/A;  . COLONOSCOPY N/A 03/02/2013   Procedure: COLONOSCOPY;  Surgeon: Beryle Beams, MD;  Location: [facility];  Service: Endoscopy;  Laterality: N/A;  . 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 KNEE ARTHROSCOPY  2001   rt knee  . UPPER GI ENDOSCOPY  11/05/2014   Procedure: UPPER GI ENDOSCOPY;  Surgeon: Excell Seltzer, MD;  Location: [facility];  Service: General;;    His Family History Is Significant For: Family History  Problem Relation Age of Onset  . Hypertension  Mother   . Hypertension Other   . Hyperlipidemia Other   . Heart disease Other   . Obesity Other   . Sleep apnea Other   . Breast cancer Maternal Grandmother   . Colon cancer Maternal Grandfather     His Social History Is Significant For: Social History   Social History  . Marital status: Married    Spouse name: N/A  . Number of children: 2  . Years of education: HS   Social History Main Topics  . Smoking status: Never Smoker  . Smokeless tobacco: Never Used  . Alcohol use No  . Drug use: No  . Sexual activity: Yes   Other Topics Concern  . None   Social History Narrative   Denies caffeine use     His Allergies Are:  Allergies  Allergen Reactions  . Penicillins Rash  :   His Current Medications Are:  Outpatient Encounter Prescriptions as of 07/19/2017  Medication Sig  . amLODipine-benazepril (LOTREL) 5-20 MG capsule TAKE (2) CAPSULES BY MOUTH ONCE EVERY MORNING. (Patient taking differently: TAKE 1 CAPSULE BY MOUTH ONCE EVERY MORNING.)  . Cyanocobalamin 500 MCG/0.1ML SOLN Place 500 mcg into the nose once a week.   . ferrous sulfate 325 (65 FE) MG tablet Take 325 mg by mouth daily with breakfast.  . loratadine (CLARITIN) 10 MG tablet Take 10 mg by mouth every morning.  . Multiple Vitamins-Minerals (MULTIVITAMIN WITH MINERALS) tablet Take 1 tablet by mouth every morning.   Marland Kitchen OVER THE COUNTER MEDICATION Calcium - 643m - vitamin d 1500units - magnesium - 1283m- 2 tablets bid  . OVER THE COUNTER MEDICATION Iron - 5429m vitamn c 19m3m1 qam - 2 pm   No facility-administered encounter medications on file as of 07/19/2017.   :  Review of Systems:  Out of a complete 14 point review of systems, all are reviewed and negative with the exception of these symptoms as listed below: Review of Systems  Neurological:       Pt presents today to discuss his cpap. Pt reports that he needs a new cpap; his current cpap will turn on and off by itself. Pt is using [employer]AssurantObjective:  Neurological Exam  Physical Exam Physical Examination:   Vitals:   07/19/17 1459  BP: (!) 175/104  Pulse: 64   General Examination: The patient is a very pleasant 51 y71. male in no acute distress. He appears well-developed and well-nourished and well groomed.  HEENT: Normocephalic, atraumatic, pupils are equal, round and reactive to light and accommodation. Extraocular tracking is good without limitation to gaze excursion or nystagmus noted. Normal smooth pursuit is noted. Hearing is grossly intact. Face is symmetric with normal facial animation and normal facial sensation. Speech is clear with no dysarthria noted. There is no hypophonia. There is no lip, neck/head, jaw or voice tremor. Neck is supple with full range of passive and active motion. There are no carotid bruits on auscultation. Oropharynx exam reveals: mild mouth dryness, good dental hygiene and moderate airway crowding, due to redundant soft palate, large uvula, tonsils of 1+ on the left and 2+ on the right. Mallampati is class II. Tongue protrudes centrally  and palate elevates symmetrically. There is mild pharyngeal erythema, again noted today.   Chest: Clear to auscultation without wheezing, rhonchi or crackles noted.  Heart: S1+S2+0, regular and normal without murmurs, rubs or gallops noted.   Abdomen: Soft, non-tender and non-distended with normal bowel sounds appreciated on  auscultation.  Extremities: There is no pitting edema in the distal lower extremities bilaterally. Pedal pulses are intact.  Skin: Warm and dry without trophic changes noted. There are no varicose veins.  Musculoskeletal: exam reveals no obvious joint deformities, tenderness or joint swelling or erythema, with the exception of mild discomfort in his knees, wears elastic copper braces over both knees with padding. No pitting edema.   Neurologically:  Mental status: The patient is awake, alert and oriented in all 4 spheres. His immediate and remote memory, attention, language skills and fund of knowledge are appropriate. There is no evidence of aphasia, agnosia, apraxia or anomia. Speech is clear with normal prosody and enunciation. Thought process is linear. Mood is normal and affect is normal.  Cranial nerves II - XII are as described above under HEENT exam. In addition: shoulder shrug is normal with equal shoulder height noted. Motor exam: Normal bulk, strength and tone is noted. There is no drift, tremor or rebound. Romberg is negative. Reflexes are 1+ throughout. Fine motor skills and coordination: intact grossly.   Cerebellar testing: No dysmetria or intention tremor. There is no truncal or gait ataxia.  Sensory exam: intact to light touch in the upper and lower extremities.  Gait, station and balance: He stands easily. No veering to one side is noted. No leaning to one side is noted. Posture is age-appropriate and stance is narrow based. Gait shows normal stride length and normal pace. No problems turning are noted. Tandem walk is unremarkable.   Assessment and  Plan:  In summary, Brian Newton is a very pleasant 51 year old male with an underlying medical history of hyperlipidemia, hypertension, arthritis, borderline diabetes and morbid obesity with status post. Check surgery in November 2015 with significant weight loss achieved, who was previously diagnosed with severe obstructive sleep apnea and placed on CPAP therapy. He was compliant with CPAP therapy for 10 years but  needed reevaluation. He had a sleep study in November 2016 which showed residual obstructive sleep apnea and he was on AutoPap therapy for this. He has been compliant with treatment and commended for this. He has ongoing good results but his current machine is of older generation and is now defective. He needs a new AutoPap machine which I will prescribe today. He has always been compliant with treatment and physical exam is stable with the exception of weight fluctuations. He is using a nasal mask with chinstrap. He does not note a high leak typically. I will narrow the AutoPap settings to 6 cm to 12 cm. I suggested a one-year checkup, sooner as needed. I answered all his questions today and he was in agreement.  I spent 20 minutes in total face-to-face time with the patient, more than 50% of which was spent in counseling and coordination of care, reviewing test results, reviewing medication and discussing or reviewing the diagnosis of OSA, its prognosis and treatment options. Pertinent laboratory and imaging test results that were available during this visit with the patient were reviewed by me and considered in my medical decision making (see chart for details).

## 2017-07-20 DIAGNOSIS — G4733 Obstructive sleep apnea (adult) (pediatric): Secondary | ICD-10-CM | POA: Diagnosis not present

## 2017-07-21 ENCOUNTER — Ambulatory Visit (INDEPENDENT_AMBULATORY_CARE_PROVIDER_SITE_OTHER): Payer: Federal, State, Local not specified - PPO | Admitting: Family Medicine

## 2017-07-21 VITALS — BP 132/90 | HR 74 | Temp 97.8°F | Resp 14 | Ht 65.0 in | Wt 217.0 lb

## 2017-07-21 DIAGNOSIS — L739 Follicular disorder, unspecified: Secondary | ICD-10-CM | POA: Diagnosis not present

## 2017-07-21 MED ORDER — SULFAMETHOXAZOLE-TRIMETHOPRIM 800-160 MG PO TABS: 1.0000 | ORAL_TABLET | Freq: Two times a day (BID) | ORAL | 0 refills | Status: DC

## 2017-07-21 NOTE — Progress Notes (Signed)
Subjective:    Patient ID: Brian Newton, male    DOB: Jul 08, 1966, 51 y.o.   MRN: [id number]  HPI Patient presents with a one-week history of pustules on both legs. There is a large pustule on the medial side of his right thigh near his growing. The pustules approximately 3 mm in diameter and there is surrounding erythema. He has smaller pustules that have scabbed over on his right knee and on his lateral right thigh as well as on his lateral left thigh. It appears to be small bullae vs pustules.  He denies any contact in this area to plants or other services that he could be allergic to. They do not itch. There is no other rash anywhere else on his body Past Medical History:  Diagnosis Date  . Arthritis   . Borderline hyperglycemia   . Headache(784.0)   . Hypercholesteremia   . Hypertension   . Metabolic syndrome   . Shingles   . Sleep apnea    uses C PAP  . Swelling    both legs  . 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 Wrist pain    Past Surgical History:  Procedure Laterality Date  . APPENDECTOMY  1986  . BREATH TEK H PYLORI N/A 08/08/2014   Procedure: BREATH TEK H PYLORI;  Surgeon: Edward Jolly, MD;  Location: [facility];  Service: General;  Laterality: N/A;  . COLONOSCOPY N/A 03/02/2013   Procedure: COLONOSCOPY;  Surgeon: Beryle Beams, MD;  Location: [facility];  Service: Endoscopy;  Laterality: N/A;  . 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 KNEE ARTHROSCOPY  2001   rt knee  . UPPER GI ENDOSCOPY  11/05/2014   Procedure: UPPER GI ENDOSCOPY;  Surgeon: Excell Seltzer, MD;  Location: [facility];  Service: General;;   Current Outpatient Prescriptions on File Prior to Visit  Medication Sig Dispense Refill  . amLODipine-benazepril (LOTREL) 5-20 MG capsule TAKE (2) CAPSULES BY MOUTH ONCE EVERY  MORNING. (Patient taking differently: TAKE 1 CAPSULE BY MOUTH ONCE EVERY MORNING.) 60 capsule 11  . Cyanocobalamin 500 MCG/0.1ML SOLN Place 500 mcg into the nose once a week.    . ferrous sulfate 325 (65 FE) MG tablet Take 325 mg by mouth daily with breakfast.    . loratadine (CLARITIN) 10 MG tablet Take 10 mg by mouth every morning.    . Multiple Vitamins-Minerals (MULTIVITAMIN WITH MINERALS) tablet Take 1 tablet by mouth every morning.     Marland Kitchen OVER THE COUNTER MEDICATION Calcium - 600mg  - vitamin d 1500units - magnesium - 120mg  - 2 tablets bid    . OVER THE COUNTER MEDICATION Iron - 54mg  - vitamn c 90mg  - 1 qam - 2 pm     No current facility-administered medications on file prior to visit.    Allergies  Allergen Reactions  . Penicillins Rash   Social History   Social History  . Marital status: Married    Spouse name: N/A  . Number of children: 2  . Years of education: HS   Occupational History  . Not on file.   Social History Main Topics  . Smoking status: Never Smoker  . Smokeless tobacco: Never Used  . Alcohol use No  . Drug use: No  . Sexual activity: Yes   Other Topics Concern  . Not on file   Social History  Narrative   Denies caffeine use       Review of Systems  All other systems reviewed and are negative.      Objective:   Physical Exam  Cardiovascular: Normal rate, regular rhythm and normal heart sounds.   Pulmonary/Chest: Effort normal and breath sounds normal. No respiratory distress. He has no wheezes. He has no rales.  Skin: Rash noted. There is erythema.  Vitals reviewed.         Assessment & Plan:  Folliculitis - Plan: sulfamethoxazole-trimethoprim (BACTRIM DS,SEPTRA DS) 800-160 MG tablet  I believe the patient has folliculitis secondary to staph aureus. I will treat the patient with Bactrim double strength tablets 1 by mouth twice a day for 7 days. If the lesions persist, I would recommend a biopsy to diagnose

## 2017-08-08 ENCOUNTER — Encounter: Payer: Self-pay | Admitting: Family Medicine

## 2017-08-15 ENCOUNTER — Encounter: Payer: Self-pay | Admitting: Family Medicine

## 2017-08-15 ENCOUNTER — Ambulatory Visit (INDEPENDENT_AMBULATORY_CARE_PROVIDER_SITE_OTHER): Payer: Federal, State, Local not specified - PPO | Admitting: Family Medicine

## 2017-08-15 ENCOUNTER — Other Ambulatory Visit: Payer: Self-pay | Admitting: Family Medicine

## 2017-08-15 VITALS — BP 136/80 | HR 72 | Temp 98.1°F | Resp 14 | Ht 65.0 in | Wt 227.0 lb

## 2017-08-15 DIAGNOSIS — L309 Dermatitis, unspecified: Secondary | ICD-10-CM | POA: Diagnosis not present

## 2017-08-15 DIAGNOSIS — L739 Follicular disorder, unspecified: Secondary | ICD-10-CM

## 2017-08-15 NOTE — Addendum Note (Signed)
Addended by: Shary Decamp B on: 08/15/2017 04:23 PM   Modules accepted: Orders

## 2017-08-15 NOTE — Progress Notes (Signed)
Subjective:    Patient ID: Brian Newton, male    DOB: 08-Apr-1966, 51 y.o.   MRN: [id number]  HPI  07/21/17 Patient presents with a one-week history of pustules on both legs. There is a large pustule on the medial side of his right thigh near his growing. The pustules approximately 3 mm in diameter and there is surrounding erythema. He has smaller pustules that have scabbed over on his right knee and on his lateral right thigh as well as on his lateral left thigh. It appears to be small bullae vs pustules.  He denies any contact in this area to plants or other services that he could be allergic to. They do not itch. There is no other rash anywhere else on his body.  At that time, my plan was:  I believe the patient has folliculitis secondary to staph aureus. I will treat the patient with Bactrim double strength tablets 1 by mouth twice a day for 7 days. If the lesions persist, I would recommend a biopsy to diagnose  08/15/17 The rash on his legs has completely resolved. However now, the patient is developing similar outbreaks on his upper extremities. He has a 4-5 mm erythematous papular lesion on his lower right abdomen. There is a 6-7 mm irritated papular lesion in his right axilla. He has a similar 4-5 mm pustular/papular lesion above his left nipple. Past Medical History:  Diagnosis Date  . Arthritis   . Borderline hyperglycemia   . Headache(784.0)   . Hypercholesteremia   . Hypertension   . Metabolic syndrome   . Shingles   . Sleep apnea    uses C PAP  . Swelling    both legs  . 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 Wrist pain    Past Surgical History:  Procedure Laterality Date  . APPENDECTOMY  1986  . BREATH TEK H PYLORI N/A 08/08/2014   Procedure: BREATH TEK H PYLORI;  Surgeon: Edward Jolly, MD;  Location: [facility];  Service: General;  Laterality: N/A;  . COLONOSCOPY N/A 03/02/2013   Procedure: COLONOSCOPY;  Surgeon: Beryle Beams, MD;   Location: [facility];  Service: Endoscopy;  Laterality: N/A;  . 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 KNEE ARTHROSCOPY  2001   rt knee  . UPPER GI ENDOSCOPY  11/05/2014   Procedure: UPPER GI ENDOSCOPY;  Surgeon: Excell Seltzer, MD;  Location: [facility];  Service: General;;   Current Outpatient Prescriptions on File Prior to Visit  Medication Sig Dispense Refill  . amLODipine-benazepril (LOTREL) 5-20 MG capsule TAKE (2) CAPSULES BY MOUTH ONCE EVERY MORNING. (Patient taking differently: TAKE 1 CAPSULE BY MOUTH ONCE EVERY MORNING.) 60 capsule 11  . Cyanocobalamin 500 MCG/0.1ML SOLN Place 500 mcg into the nose once a week.    . ferrous sulfate 325 (65 FE) MG tablet Take 325 mg by mouth daily with breakfast.    . loratadine (CLARITIN) 10 MG tablet Take 10 mg by mouth every morning.    . Multiple Vitamins-Minerals (MULTIVITAMIN WITH MINERALS) tablet Take 1 tablet by mouth every morning.     Marland Kitchen OVER THE COUNTER MEDICATION Calcium - 600mg  - vitamin d 1500units - magnesium - 120mg  - 2 tablets bid    . OVER THE COUNTER MEDICATION Iron - 54mg  - vitamn c 90mg  - 1 qam - 2 pm  No current facility-administered medications on file prior to visit.    Allergies  Allergen Reactions  . Penicillins Rash   Social History   Social History  . Marital status: Married    Spouse name: N/A  . Number of children: 2  . Years of education: HS   Occupational History  . Not on file.   Social History Main Topics  . Smoking status: Never Smoker  . Smokeless tobacco: Never Used  . Alcohol use No  . Drug use: No  . Sexual activity: Yes   Other Topics Concern  . Not on file   Social History Narrative   Denies caffeine use       Review of Systems  All other systems reviewed and are negative.      Objective:   Physical Exam    Cardiovascular: Normal rate, regular rhythm and normal heart sounds.   Pulmonary/Chest: Effort normal and breath sounds normal. No respiratory distress. He has no wheezes. He has no rales.  Skin: Rash noted. There is erythema.  Vitals reviewed.         Assessment & Plan:  Folliculitis - Plan: Pathology  Dermatitis - Plan: Pathology At this time, I'm unable to tell the patient exactly was causing this rash. I believe he is likely colonized with staph, and these are isolated infected hair follicles. Therefore I performed a nasal swab to evaluate for staph aureus. I will also perform a shave biopsy to determine exactly what these lesions are. Due to the sensitivity of the areas, I elected to biopsy the lesion on his lower right abdomen. The area was anesthetized 0.1% lidocaine with epinephrine and a shave biopsy was performed and sent to pathology and labeled container. If biopsy confirms folliculitis and his nose is colonized with staph, I would reassure the patient and lesions were be treated symptomatically unless they became infected.

## 2017-08-18 LAB — PATHOLOGY

## 2017-08-19 ENCOUNTER — Encounter: Payer: Self-pay | Admitting: Family Medicine

## 2017-08-19 LAB — CULTURE, AEROBIC BACTERIA

## 2017-08-20 DIAGNOSIS — G4733 Obstructive sleep apnea (adult) (pediatric): Secondary | ICD-10-CM | POA: Diagnosis not present

## 2017-09-19 DIAGNOSIS — G4733 Obstructive sleep apnea (adult) (pediatric): Secondary | ICD-10-CM | POA: Diagnosis not present

## 2017-09-30 ENCOUNTER — Ambulatory Visit: Payer: Federal, State, Local not specified - PPO

## 2017-10-04 ENCOUNTER — Ambulatory Visit (INDEPENDENT_AMBULATORY_CARE_PROVIDER_SITE_OTHER): Payer: Federal, State, Local not specified - PPO | Admitting: Family Medicine

## 2017-10-04 DIAGNOSIS — Z23 Encounter for immunization: Secondary | ICD-10-CM

## 2017-10-20 DIAGNOSIS — G4733 Obstructive sleep apnea (adult) (pediatric): Secondary | ICD-10-CM | POA: Diagnosis not present

## 2017-10-26 ENCOUNTER — Other Ambulatory Visit: Payer: Federal, State, Local not specified - PPO

## 2017-10-26 ENCOUNTER — Other Ambulatory Visit: Payer: Self-pay | Admitting: Family Medicine

## 2017-10-26 DIAGNOSIS — Z79899 Other long term (current) drug therapy: Secondary | ICD-10-CM

## 2017-10-26 DIAGNOSIS — E7800 Pure hypercholesterolemia, unspecified: Secondary | ICD-10-CM

## 2017-10-26 DIAGNOSIS — I10 Essential (primary) hypertension: Secondary | ICD-10-CM

## 2017-10-26 DIAGNOSIS — E8881 Metabolic syndrome: Secondary | ICD-10-CM

## 2017-10-27 ENCOUNTER — Other Ambulatory Visit: Payer: Federal, State, Local not specified - PPO

## 2017-10-27 DIAGNOSIS — I10 Essential (primary) hypertension: Secondary | ICD-10-CM | POA: Diagnosis not present

## 2017-10-27 DIAGNOSIS — E8881 Metabolic syndrome: Secondary | ICD-10-CM | POA: Diagnosis not present

## 2017-10-27 DIAGNOSIS — Z79899 Other long term (current) drug therapy: Secondary | ICD-10-CM

## 2017-10-27 DIAGNOSIS — E7800 Pure hypercholesterolemia, unspecified: Secondary | ICD-10-CM | POA: Diagnosis not present

## 2017-10-31 ENCOUNTER — Ambulatory Visit (INDEPENDENT_AMBULATORY_CARE_PROVIDER_SITE_OTHER): Payer: Federal, State, Local not specified - PPO | Admitting: Family Medicine

## 2017-10-31 ENCOUNTER — Encounter: Payer: Self-pay | Admitting: Family Medicine

## 2017-10-31 VITALS — BP 142/90 | HR 68 | Temp 97.6°F | Resp 16 | Ht 65.0 in | Wt 227.0 lb

## 2017-10-31 DIAGNOSIS — E8881 Metabolic syndrome: Secondary | ICD-10-CM | POA: Diagnosis not present

## 2017-10-31 DIAGNOSIS — L304 Erythema intertrigo: Secondary | ICD-10-CM | POA: Diagnosis not present

## 2017-10-31 DIAGNOSIS — Z9884 Bariatric surgery status: Secondary | ICD-10-CM | POA: Diagnosis not present

## 2017-10-31 DIAGNOSIS — I10 Essential (primary) hypertension: Secondary | ICD-10-CM

## 2017-10-31 MED ORDER — CLOTRIMAZOLE-BETAMETHASONE 1-0.05 % EX CREA: 1.0000 "application " | TOPICAL_CREAM | Freq: Two times a day (BID) | CUTANEOUS | 0 refills | Status: DC

## 2017-10-31 NOTE — Progress Notes (Signed)
Subjective:    Patient ID: Brian Newton, male    DOB: 1966/07/14, 51 y.o.   MRN: [id number]  HPI  Here today for follow-up of his hypertension hyperlipidemia and metabolic syndrome.  Overall he is been doing well however he has an intertrigo-like rash on his upper medial thighs bilaterally extending over onto his perineum and scrotum.  He has tried topical antifungal creams, Desitin cream, Goldbond powder, talcum powder with no benefit.  His blood pressure today is borderline elevated at 142/90.  He denies any chest pain shortness of breath or dyspnea on exertion.  His most recent lab work as listed below.  He also requests that I check his vitamin D level, vitamin B12 level, and an iron level given his history of gastric bypass. Appointment on 10/27/2017  Component Date Value Ref Range Status  . Glucose, Bld 10/27/2017 88  65 - 99 mg/dL Final   Comment: .            Fasting reference interval .   . BUN 10/27/2017 28* 7 - 25 mg/dL Final  . Creat 10/27/2017 0.90  0.70 - 1.33 mg/dL Final   Comment: For patients >71 years of age, the reference limit for Creatinine is approximately 13% higher for people identified as African-American. .   . GFR, Est Non African American 10/27/2017 97  > OR = 60 mL/min/1.14m2 Final  . GFR, Est African American 10/27/2017 113  > OR = 60 mL/min/1.84m2 Final  . BUN/Creatinine Ratio 10/27/2017 31* 6 - 22 (calc) Final  . Sodium 10/27/2017 143  135 - 146 mmol/L Final  . Potassium 10/27/2017 3.9  3.5 - 5.3 mmol/L Final  . Chloride 10/27/2017 109  98 - 110 mmol/L Final  . CO2 10/27/2017 25  20 - 32 mmol/L Final  . Calcium 10/27/2017 9.4  8.6 - 10.3 mg/dL Final  . Total Protein 10/27/2017 6.4  6.1 - 8.1 g/dL Final  . Albumin 10/27/2017 4.1  3.6 - 5.1 g/dL Final  . Globulin 10/27/2017 2.3  1.9 - 3.7 g/dL (calc) Final  . AG Ratio 10/27/2017 1.8  1.0 - 2.5 (calc) Final  . Total Bilirubin 10/27/2017 0.6  0.2 - 1.2 mg/dL Final  . Alkaline phosphatase (APISO)  10/27/2017 74  40 - 115 U/L Final  . AST 10/27/2017 25  10 - 35 U/L Final  . ALT 10/27/2017 19  9 - 46 U/L Final  . Cholesterol 10/27/2017 169  <200 mg/dL Final  . HDL 10/27/2017 59  >40 mg/dL Final  . Triglycerides 10/27/2017 32  <150 mg/dL Final  . LDL Cholesterol (Calc) 10/27/2017 101* mg/dL (calc) Final   Comment: Reference range: <100 . Desirable range <100 mg/dL for primary prevention;   <70 mg/dL for patients with CHD or diabetic patients  with > or = 2 CHD risk factors. Marland Kitchen LDL-C is now calculated using the Martin-Hopkins  calculation, which is a validated novel method providing  better accuracy than the Friedewald equation in the  estimation of LDL-C.  Cresenciano Genre et al. Annamaria Helling. 3009;233(00): 2061-2068  (http://education.[facility].com/faq/FAQ164)   . Total CHOL/HDL Ratio 10/27/2017 2.9  <5.0 (calc) Final  . Non-HDL Cholesterol (Calc) 10/27/2017 110  <130 mg/dL (calc) Final   Comment: For patients with diabetes plus 1 major ASCVD risk  factor, treating to a non-HDL-C goal of <100 mg/dL  (LDL-C of <70 mg/dL) is considered a therapeutic  option.   . WBC 10/27/2017 9.3  3.8 - 10.8 Thousand/uL Final  . RBC 10/27/2017 4.72  4.20 - 5.80 Million/uL Final  . Hemoglobin 10/27/2017 14.2  13.2 - 17.1 g/dL Final  . HCT 10/27/2017 42.1  38.5 - 50.0 % Final  . MCV 10/27/2017 89.2  80.0 - 100.0 fL Final  . MCH 10/27/2017 30.1  27.0 - 33.0 pg Final  . MCHC 10/27/2017 33.7  32.0 - 36.0 g/dL Final  . RDW 10/27/2017 12.8  11.0 - 15.0 % Final  . Platelets 10/27/2017 330  140 - 400 Thousand/uL Final  . MPV 10/27/2017 10.7  7.5 - 12.5 fL Final  . Neutro Abs 10/27/2017 6,677  1,500 - 7,800 cells/uL Final  . Lymphs Abs 10/27/2017 1,897  850 - 3,900 cells/uL Final  . WBC mixed population 10/27/2017 651  200 - 950 cells/uL Final  . Eosinophils Absolute 10/27/2017 47  15 - 500 cells/uL Final  . Basophils Absolute 10/27/2017 28  0 - 200 cells/uL Final  . Neutrophils Relative % 10/27/2017 71.8   % Final  . Total Lymphocyte 10/27/2017 20.4  % Final  . Monocytes Relative 10/27/2017 7.0  % Final  . Eosinophils Relative 10/27/2017 0.5  % Final  . Basophils Relative 10/27/2017 0.3  % Final  . Hgb A1c MFr Bld 10/27/2017 5.3  <5.7 % of total Hgb Final   Comment: For the purpose of screening for the presence of diabetes: . <5.7%       Consistent with the absence of diabetes 5.7-6.4%    Consistent with increased risk for diabetes             (prediabetes) > or =6.5%  Consistent with diabetes . This assay result is consistent with a decreased risk of diabetes. . Currently, no consensus exists regarding use of hemoglobin A1c for diagnosis of diabetes in children. . According to American Diabetes Association (ADA) guidelines, hemoglobin A1c <7.0% represents optimal control in non-pregnant diabetic patients. Different metrics may apply to specific patient populations.  Standards of Medical Care in Diabetes(ADA). .   . Mean Plasma Glucose 10/27/2017 105  (calc) Final  . eAG (mmol/L) 10/27/2017 5.8  (calc) Final    Past Medical History:  Diagnosis Date  . Arthritis   . Borderline hyperglycemia   . Headache(784.0)   . Hypercholesteremia   . Hypertension   . Metabolic syndrome   . Shingles   . Sleep apnea    uses C PAP  . Swelling    both legs  . 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 Wrist pain    Current Outpatient Medications on File Prior to Visit  Medication Sig Dispense Refill  . amLODipine-benazepril (LOTREL) 5-20 MG capsule TAKE (2) CAPSULES BY MOUTH ONCE EVERY MORNING. 60 capsule 11  . Cyanocobalamin 500 MCG/0.1ML SOLN Place 500 mcg into the nose once a week.    . ferrous sulfate 325 (65 FE) MG tablet Take 325 mg by mouth daily with breakfast.    . loratadine (CLARITIN) 10 MG tablet Take 10 mg by mouth every morning.    . Multiple Vitamins-Minerals (MULTIVITAMIN WITH MINERALS) tablet Take 1 tablet by mouth every morning.     Marland Kitchen OVER THE COUNTER  MEDICATION Calcium - 600mg  - vitamin d 1500units - magnesium - 120mg  - 2 tablets bid    . OVER THE COUNTER MEDICATION Iron - 54mg  - vitamn c 90mg  - 1 qam - 2 pm     No current facility-administered medications on file prior to visit.    Allergies  Allergen Reactions  . Penicillins Rash   Social History  Socioeconomic History  . Marital status: Married    Spouse name: Not on file  . Number of children: 2  . Years of education: HS  . Highest education level: Not on file  Social Needs  . Financial resource strain: Not on file  . Food insecurity - worry: Not on file  . Food insecurity - inability: Not on file  . Transportation needs - medical: Not on file  . Transportation needs - non-medical: Not on file  Occupational History  . Not on file  Tobacco Use  . Smoking status: Never Smoker  . Smokeless tobacco: Never Used  Substance and Sexual Activity  . Alcohol use: No    Alcohol/week: 0.0 oz  . Drug use: No  . Sexual activity: Yes  Other Topics Concern  . Not on file  Social History Narrative   Denies caffeine use       Review of Systems  All other systems reviewed and are negative.      Objective:   Physical Exam  Constitutional: He appears well-developed and well-nourished. No distress.  Neck: Neck supple. No thyromegaly present.  Cardiovascular: Normal rate, regular rhythm and normal heart sounds. Exam reveals no gallop and no friction rub.  No murmur heard. Pulmonary/Chest: Effort normal and breath sounds normal. No respiratory distress. He has no wheezes. He has no rales.  Abdominal: Soft. Bowel sounds are normal. He exhibits no distension. There is no tenderness. There is no rebound and no guarding.  Genitourinary:     Lymphadenopathy:    He has no cervical adenopathy.  Skin: He is not diaphoretic.  Vitals reviewed.       Assessment & Plan:  History of gastric bypass - Plan: Iron, Vitamin B12, VITAMIN D 25 Hydroxy (Vit-D Deficiency,  Fractures)  Metabolic syndrome  Essential hypertension  Intertrigo  I have asked the patient to check his blood pressure every day and notify me of the values in 2 weeks.  He is currently taking 10 mg a day of amlodipine and 40 mg a day of benazepril.  If persistently elevated, I would add hydrochlorothiazide.  His hemoglobin A1c continues to remain normal.  His cholesterol is excellent.  I have recommended switching to an annual follow-up with a complete physical exam.  He is cured his metabolic syndrome after his gastric bypass.  In accordance with his wishes, I will check a B12 level, vitamin D level, and an iron level.  I will treat the intertrigo with Lotrisone cream twice daily for 14 days.  He can then maybe use the cream once a week to try to suppress it.  I recommended against using it every day indefinitely due to the risk of developing dependence on topical steroid creams and explained this to the patient.

## 2017-11-01 LAB — COMPLETE METABOLIC PANEL WITH GFR
AG Ratio: 1.8 (calc) (ref 1.0–2.5)
ALT: 19 U/L (ref 9–46)
AST: 25 U/L (ref 10–35)
Albumin: 4.1 g/dL (ref 3.6–5.1)
Alkaline phosphatase (APISO): 74 U/L (ref 40–115)
BUN/Creatinine Ratio: 31 (calc) — ABNORMAL HIGH (ref 6–22)
BUN: 28 mg/dL — ABNORMAL HIGH (ref 7–25)
CALCIUM: 9.4 mg/dL (ref 8.6–10.3)
CO2: 25 mmol/L (ref 20–32)
CREATININE: 0.9 mg/dL (ref 0.70–1.33)
Chloride: 109 mmol/L (ref 98–110)
GFR, EST NON AFRICAN AMERICAN: 97 mL/min/{1.73_m2} (ref >=60)
GFR, Est African American: 113 mL/min/{1.73_m2} (ref >=60)
Globulin: 2.3 g/dL (calc) (ref 1.9–3.7)
Glucose, Bld: 88 mg/dL (ref 65–99)
Potassium: 3.9 mmol/L (ref 3.5–5.3)
Sodium: 143 mmol/L (ref 135–146)
TOTAL PROTEIN: 6.4 g/dL (ref 6.1–8.1)
Total Bilirubin: 0.6 mg/dL (ref 0.2–1.2)

## 2017-11-01 LAB — LIPID PANEL
CHOL/HDL RATIO: 2.9 (calc) (ref <5.0)
Cholesterol: 169 mg/dL (ref <200)
HDL: 59 mg/dL (ref >40)
LDL CHOLESTEROL (CALC): 101 mg/dL — AB
NON-HDL CHOLESTEROL (CALC): 110 mg/dL (ref <130)
TRIGLYCERIDES: 32 mg/dL (ref <150)

## 2017-11-01 LAB — HEMOGLOBIN A1C
HEMOGLOBIN A1C: 5.3 %{Hb} (ref <5.7)
Mean Plasma Glucose: 105 (calc)
eAG (mmol/L): 5.8 (calc)

## 2017-11-01 LAB — CBC WITH DIFFERENTIAL/PLATELET
BASOS ABS: 28 {cells}/uL (ref 0–200)
Basophils Relative: 0.3 %
EOS PCT: 0.5 %
Eosinophils Absolute: 47 cells/uL (ref 15–500)
HEMATOCRIT: 42.1 % (ref 38.5–50.0)
Hemoglobin: 14.2 g/dL (ref 13.2–17.1)
Lymphs Abs: 1897 cells/uL (ref 850–3900)
MCH: 30.1 pg (ref 27.0–33.0)
MCHC: 33.7 g/dL (ref 32.0–36.0)
MCV: 89.2 fL (ref 80.0–100.0)
MONOS PCT: 7 %
MPV: 10.7 fL (ref 7.5–12.5)
NEUTROS PCT: 71.8 %
Neutro Abs: 6677 cells/uL (ref 1500–7800)
Platelets: 330 10*3/uL (ref 140–400)
RBC: 4.72 10*6/uL (ref 4.20–5.80)
RDW: 12.8 % (ref 11.0–15.0)
TOTAL LYMPHOCYTE: 20.4 %
WBC mixed population: 651 cells/uL (ref 200–950)
WBC: 9.3 10*3/uL (ref 3.8–10.8)

## 2017-11-01 LAB — VITAMIN B12: Vitamin B-12: 1230 pg/mL — ABNORMAL HIGH (ref 200–1100)

## 2017-11-01 LAB — VITAMIN D 25 HYDROXY (VIT D DEFICIENCY, FRACTURES): Vit D, 25-Hydroxy: 26 ng/mL — ABNORMAL LOW (ref 30–100)

## 2017-11-01 LAB — TEST AUTHORIZATION

## 2017-11-01 LAB — IRON: Iron: 61 ug/dL (ref 50–180)

## 2017-11-23 DIAGNOSIS — K080 Exfoliation of teeth due to systemic causes: Secondary | ICD-10-CM | POA: Diagnosis not present

## 2017-12-02 DIAGNOSIS — Z9884 Bariatric surgery status: Secondary | ICD-10-CM | POA: Diagnosis not present

## 2017-12-02 DIAGNOSIS — G4733 Obstructive sleep apnea (adult) (pediatric): Secondary | ICD-10-CM | POA: Diagnosis not present

## 2017-12-20 DIAGNOSIS — G4733 Obstructive sleep apnea (adult) (pediatric): Secondary | ICD-10-CM | POA: Diagnosis not present

## 2018-01-06 ENCOUNTER — Telehealth: Payer: Self-pay | Admitting: Family Medicine

## 2018-01-06 NOTE — Telephone Encounter (Signed)
Received FMLA paperwork for this patient - paperwork filled out and faxed to [facility] @ [phone].

## 2018-01-20 DIAGNOSIS — G4733 Obstructive sleep apnea (adult) (pediatric): Secondary | ICD-10-CM | POA: Diagnosis not present

## 2018-02-17 DIAGNOSIS — G4733 Obstructive sleep apnea (adult) (pediatric): Secondary | ICD-10-CM | POA: Diagnosis not present

## 2018-02-24 ENCOUNTER — Ambulatory Visit: Payer: Federal, State, Local not specified - PPO | Admitting: Family Medicine

## 2018-02-24 ENCOUNTER — Encounter: Payer: Self-pay | Admitting: Family Medicine

## 2018-02-24 VITALS — BP 130/90 | HR 66 | Temp 97.8°F | Resp 16 | Ht 65.0 in | Wt 232.0 lb

## 2018-02-24 DIAGNOSIS — M79672 Pain in left foot: Secondary | ICD-10-CM | POA: Diagnosis not present

## 2018-02-24 DIAGNOSIS — R51 Headache: Secondary | ICD-10-CM | POA: Diagnosis not present

## 2018-02-24 DIAGNOSIS — R519 Headache, unspecified: Secondary | ICD-10-CM

## 2018-02-24 MED ORDER — PREDNISONE 20 MG PO TABS: ORAL_TABLET | ORAL | 0 refills | Status: DC

## 2018-02-24 NOTE — Progress Notes (Signed)
Subjective:    Patient ID: Brian Newton, male    DOB: June 27, 1966, 52 y.o.   MRN: [id number]  HPI Patient reports a sudden onset of pain and swelling in his left foot. Swelling and pain began in his left ankle. He denies any injury. The ankle was warm and tender to the touch. Is gradually improved over a few days, the pain and swelling proceeded to the dorsum of the foot. That too was swollen and tender to the touch. Then the pain and swelling moved back to his medial malleolus area with swelling and tenderness in that area. There has been no injury. There is no swelling higher in the leg. He has negative Homans sign. He is questioning if this could be gout and I believe he is likely right. He is also reporting chronic daily headache. He's having uses Excedrin Migraine 5 days a week to stop his headache. 15 minutes after taking the medication the headache will subside but is concerned by the frequency at which she's taking it. Headaches are nonpulsatile with no photophobia or phonophobia. There is no nausea or vomiting but without the medication the headaches will gradually building become more severe suggesting medication overuse headache. This is gone on for several months. Of note the swelling and pain in his leg is only been for a few days Past Medical History:  Diagnosis Date  . Arthritis   . Borderline hyperglycemia   . Headache(784.0)   . Hypercholesteremia   . Hypertension   . Metabolic syndrome   . Shingles   . Sleep apnea    uses C PAP  . Swelling    both legs  . 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 Wrist pain    Past Surgical History:  Procedure Laterality Date  . APPENDECTOMY  1986  . BREATH TEK H PYLORI N/A 08/08/2014   Procedure: BREATH TEK H PYLORI;  Surgeon: Edward Jolly, MD;  Location: [facility];  Service: General;  Laterality: N/A;  . COLONOSCOPY N/A 03/02/2013   Procedure: COLONOSCOPY;  Surgeon: Beryle Beams, MD;  Location: [facility];  Service: Endoscopy;  Laterality: N/A;  . 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 KNEE ARTHROSCOPY  2001   rt knee  . UPPER GI ENDOSCOPY  11/05/2014   Procedure: UPPER GI ENDOSCOPY;  Surgeon: Excell Seltzer, MD;  Location: [facility];  Service: General;;   Current Outpatient Medications on File Prior to Visit  Medication Sig Dispense Refill  . amLODipine-benazepril (LOTREL) 5-20 MG capsule TAKE (2) CAPSULES BY MOUTH ONCE EVERY MORNING. 60 capsule 11  . clotrimazole-betamethasone (LOTRISONE) cream Apply 1 application 2 (two) times daily topically. 45 g 0  . Cyanocobalamin 500 MCG/0.1ML SOLN Place 500 mcg into the nose once a week.    . ferrous sulfate 325 (65 FE) MG tablet Take 325 mg by mouth daily with breakfast.    . loratadine (CLARITIN) 10 MG tablet Take 10 mg by mouth every morning.    . Multiple Vitamins-Minerals (MULTIVITAMIN WITH MINERALS) tablet Take 1 tablet by mouth every morning.     Marland Kitchen OVER THE COUNTER MEDICATION Calcium - 600mg  - vitamin d 1500units - magnesium - 120mg  - 2 tablets bid    . OVER THE COUNTER MEDICATION Iron - 54mg  - vitamn c 90mg  - 1 qam - 2 pm     No  current facility-administered medications on file prior to visit.    Allergies  Allergen Reactions  . Penicillins Rash   Social History   Socioeconomic History  . Marital status: Married    Spouse name: Not on file  . Number of children: 2  . Years of education: HS  . Highest education level: Not on file  Social Needs  . Financial resource strain: Not on file  . Food insecurity - worry: Not on file  . Food insecurity - inability: Not on file  . Transportation needs - medical: Not on file  . Transportation needs - non-medical: Not on file  Occupational History  . Not on file  Tobacco Use  . Smoking status: Never Smoker  . Smokeless  tobacco: Never Used  Substance and Sexual Activity  . Alcohol use: No    Alcohol/week: 0.0 oz  . Drug use: No  . Sexual activity: Yes  Other Topics Concern  . Not on file  Social History Narrative   Denies caffeine use       Review of Systems  All other systems reviewed and are negative.      Objective:   Physical Exam  Constitutional: He is oriented to person, place, and time. He appears well-developed and well-nourished.  Cardiovascular: Normal rate, regular rhythm and normal heart sounds.  No murmur heard. Pulmonary/Chest: Effort normal and breath sounds normal. No respiratory distress. He has no wheezes. He has no rales.  Musculoskeletal: He exhibits no edema.       Left ankle: He exhibits decreased range of motion and swelling. He exhibits no ecchymosis and no deformity. Tenderness. Lateral malleolus and medial malleolus tenderness found. Achilles tendon normal.       Feet:  Neurological: He is alert and oriented to person, place, and time. He has normal reflexes. No cranial nerve deficit. He exhibits normal muscle tone. Coordination normal.  Vitals reviewed.         Assessment & Plan:  Foot pain, left - Plan: Uric acid, BASIC METABOLIC PANEL WITH GFR, predniSONE (DELTASONE) 20 MG tablet  Chronic daily headache  I believe the patient is having a gout exacerbation. Begin prednisone taper pack and reassess on Monday. Check uric acid level. Regarding his chronic daily headache, once the pain in his ankle has subsided and he is off prednisone, I would recommend trying the patient on Topamax and discontinue daily Excedrin use. If the patient does have gout, I would use colchicine as needed for pain in his joints and depending upon the frequency possibly start allopurinol as a preventative in the future

## 2018-02-25 LAB — BASIC METABOLIC PANEL WITH GFR
BUN: 21 mg/dL (ref 7–25)
CHLORIDE: 101 mmol/L (ref 98–110)
CO2: 26 mmol/L (ref 20–32)
Calcium: 9.4 mg/dL (ref 8.6–10.3)
Creat: 0.85 mg/dL (ref 0.70–1.33)
GFR, EST AFRICAN AMERICAN: 115 mL/min/{1.73_m2} (ref >=60)
GFR, EST NON AFRICAN AMERICAN: 99 mL/min/{1.73_m2} (ref >=60)
Glucose, Bld: 85 mg/dL (ref 65–99)
POTASSIUM: 4.3 mmol/L (ref 3.5–5.3)
Sodium: 139 mmol/L (ref 135–146)

## 2018-02-25 LAB — URIC ACID: URIC ACID, SERUM: 3.4 mg/dL — AB (ref 4.0–8.0)

## 2018-02-25 LAB — EXTRA LAV TOP TUBE

## 2018-02-27 ENCOUNTER — Encounter: Payer: Self-pay | Admitting: Family Medicine

## 2018-02-27 ENCOUNTER — Other Ambulatory Visit: Payer: Self-pay | Admitting: Family Medicine

## 2018-02-27 ENCOUNTER — Telehealth: Payer: Self-pay | Admitting: Family Medicine

## 2018-02-27 MED ORDER — TOPIRAMATE 25 MG PO TABS: 50.0000 mg | ORAL_TABLET | Freq: Two times a day (BID) | ORAL | 3 refills | Status: DC

## 2018-02-27 NOTE — Telephone Encounter (Signed)
How do you want to start his Topamax?

## 2018-02-27 NOTE — Telephone Encounter (Signed)
Patient is calling to let you know that his foot pain is better but not gone, also saw blood work results on my chart, would like to know what he should do next  [phone]

## 2018-02-28 NOTE — Telephone Encounter (Signed)
See my chart note.

## 2018-03-03 ENCOUNTER — Encounter: Payer: Self-pay | Admitting: Family Medicine

## 2018-03-03 ENCOUNTER — Ambulatory Visit: Payer: Federal, State, Local not specified - PPO | Admitting: Family Medicine

## 2018-03-03 VITALS — BP 130/80 | HR 68 | Temp 97.5°F | Resp 16 | Ht 65.0 in | Wt 223.0 lb

## 2018-03-03 DIAGNOSIS — I8002 Phlebitis and thrombophlebitis of superficial vessels of left lower extremity: Secondary | ICD-10-CM | POA: Diagnosis not present

## 2018-03-03 MED ORDER — SULFAMETHOXAZOLE-TRIMETHOPRIM 800-160 MG PO TABS: 1.0000 | ORAL_TABLET | Freq: Two times a day (BID) | ORAL | 0 refills | Status: DC

## 2018-03-03 NOTE — Progress Notes (Signed)
Subjective:    Patient ID: Brian Newton, male    DOB: January 09, 1966, 52 y.o.   MRN: [id number]  HPI  02/24/18 Patient reports a sudden onset of pain and swelling in his left foot. Swelling and pain began in his left ankle. He denies any injury. The ankle was warm and tender to the touch. Is gradually improved over a few days, the pain and swelling proceeded to the dorsum of the foot. That too was swollen and tender to the touch. Then the pain and swelling moved back to his medial malleolus area with swelling and tenderness in that area. There has been no injury. There is no swelling higher in the leg. He has negative Homans sign. He is questioning if this could be gout and I believe he is likely right. He is also reporting chronic daily headache. He's having uses Excedrin Migraine 5 days a week to stop his headache. 15 minutes after taking the medication the headache will subside but is concerned by the frequency at which she's taking it. Headaches are nonpulsatile with no photophobia or phonophobia. There is no nausea or vomiting but without the medication the headaches will gradually building become more severe suggesting medication overuse headache. This is gone on for several months. Of note the swelling and pain in his leg is only been for a few days.  At that time, my plan was: I believe the patient is having a gout exacerbation. Begin prednisone taper pack and reassess on Monday. Check uric acid level. Regarding his chronic daily headache, once the pain in his ankle has subsided and he is off prednisone, I would recommend trying the patient on Topamax and discontinue daily Excedrin use. If the patient does have gout, I would use colchicine as needed for pain in his joints and depending upon the frequency possibly start allopurinol as a preventative in the future  03/03/18 Lab work was negative for gout.  Pain in his ankle and foot resolved almost immediately on prednisone.  However after  discontinuation of prednisone, the patient developed warmth erythema and pain over the superior medial anterior shin.  There is swelling and tenderness in this area concerning for possible cellulitis.  He has numerous varicose veins near and around that area also raising the concern for superficial thrombophlebitis.  He has mild edema in the left lower leg.  He has a negative Homans sign.  There is no pitting edema around his ankle or lower shin.  There is only edema around the area of erythema on the upper medial shin.  I do not believe this is a DVT. Past Medical History:  Diagnosis Date  . Arthritis   . Borderline hyperglycemia   . Headache(784.0)   . Hypercholesteremia   . Hypertension   . Metabolic syndrome   . Shingles   . Sleep apnea    uses C PAP  . Swelling    both legs  . 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 Wrist pain    Past Surgical History:  Procedure Laterality Date  . APPENDECTOMY  1986  . BREATH TEK H PYLORI N/A 08/08/2014   Procedure: BREATH TEK H PYLORI;  Surgeon: Edward Jolly, MD;  Location: [facility];  Service: General;  Laterality: N/A;  . COLONOSCOPY N/A 03/02/2013   Procedure: COLONOSCOPY;  Surgeon: Beryle Beams, MD;  Location: [facility];  Service: Endoscopy;  Laterality: N/A;  . 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 KNEE ARTHROSCOPY  2001   rt knee  . UPPER GI ENDOSCOPY  11/05/2014   Procedure: UPPER GI ENDOSCOPY;  Surgeon: Excell Seltzer, MD;  Location: [facility];  Service: General;;   Current Outpatient Medications on File Prior to Visit  Medication Sig Dispense Refill  . amLODipine-benazepril (LOTREL) 5-20 MG capsule TAKE (2) CAPSULES BY MOUTH ONCE EVERY MORNING. 60 capsule 11  . clotrimazole-betamethasone (LOTRISONE) cream Apply 1 application 2 (two) times daily  topically. 45 g 0  . Cyanocobalamin 500 MCG/0.1ML SOLN Place 500 mcg into the nose once a week.    . ferrous sulfate 325 (65 FE) MG tablet Take 325 mg by mouth daily with breakfast.    . loratadine (CLARITIN) 10 MG tablet Take 10 mg by mouth every morning.    . Multiple Vitamins-Minerals (MULTIVITAMIN WITH MINERALS) tablet Take 1 tablet by mouth every morning.     Marland Kitchen OVER THE COUNTER MEDICATION Calcium - 600mg  - vitamin d 1500units - magnesium - 120mg  - 2 tablets bid    . OVER THE COUNTER MEDICATION Iron - 54mg  - vitamn c 90mg  - 1 qam - 2 pm    . topiramate (TOPAMAX) 25 MG tablet Take 2 tablets (50 mg total) by mouth 2 (two) times daily. 120 tablet 3   No current facility-administered medications on file prior to visit.    Allergies  Allergen Reactions  . Penicillins Rash   Social History   Socioeconomic History  . Marital status: Married    Spouse name: Not on file  . Number of children: 2  . Years of education: HS  . Highest education level: Not on file  Social Needs  . Financial resource strain: Not on file  . Food insecurity - worry: Not on file  . Food insecurity - inability: Not on file  . Transportation needs - medical: Not on file  . Transportation needs - non-medical: Not on file  Occupational History  . Not on file  Tobacco Use  . Smoking status: Never Smoker  . Smokeless tobacco: Never Used  Substance and Sexual Activity  . Alcohol use: No    Alcohol/week: 0.0 oz  . Drug use: No  . Sexual activity: Yes  Other Topics Concern  . Not on file  Social History Narrative   Denies caffeine use       Review of Systems  All other systems reviewed and are negative.      Objective:   Physical Exam  Constitutional: He is oriented to person, place, and time. He appears well-developed and well-nourished.  Cardiovascular: Normal rate, regular rhythm and normal heart sounds.  No murmur heard. Pulmonary/Chest: Effort normal and breath sounds normal. No respiratory  distress. He has no wheezes. He has no rales.  Musculoskeletal:       Left knee: Normal.       Left ankle: Normal. He exhibits normal range of motion, no swelling, no ecchymosis and no deformity. No tenderness. Achilles tendon normal.       Left lower leg: He exhibits swelling and edema.       Legs: Neurological: He is alert and oriented to person, place, and time. He has normal reflexes. No cranial nerve deficit. He exhibits normal muscle tone. Coordination normal.  Vitals reviewed.         Assessment & Plan:  Thrombophlebitis of superficial veins of left lower extremity - Plan: sulfamethoxazole-trimethoprim (BACTRIM DS,SEPTRA  DS) 800-160 MG tablet  Cellulitis is also on the differential diagnosis.  I recommended aspirin 325 mg 1-2 tablets every 6 hours for pain and swelling, I have recommended warm compresses be applied 3-4 times a day to the affected area.  I have also recommended rest and elevation of the extremity to facilitate improvement of the swelling, and I have recommended Bactrim double strength tablets 1 p.o. twice daily for 7 days in case cellulitis is the cause.  Reassess on Monday.  Seek medical attention immediately over the weekend if the leg swelling worsens, if he develops pain in his calf or any shortness of breath

## 2018-03-20 DIAGNOSIS — G4733 Obstructive sleep apnea (adult) (pediatric): Secondary | ICD-10-CM | POA: Diagnosis not present

## 2018-03-26 ENCOUNTER — Encounter: Payer: Self-pay | Admitting: Family Medicine

## 2018-03-27 ENCOUNTER — Ambulatory Visit (HOSPITAL_COMMUNITY)
Admission: RE | Admit: 2018-03-27 | Discharge: 2018-03-27 | Disposition: A | Discharge status: Home / Self Care | Payer: Federal, State, Local not specified - PPO | Source: Ambulatory Visit | Attending: Physician Assistant | Admitting: Physician Assistant

## 2018-03-27 ENCOUNTER — Encounter: Payer: Self-pay | Admitting: Physician Assistant

## 2018-03-27 ENCOUNTER — Ambulatory Visit (INDEPENDENT_AMBULATORY_CARE_PROVIDER_SITE_OTHER): Payer: Federal, State, Local not specified - PPO | Admitting: Physician Assistant

## 2018-03-27 ENCOUNTER — Encounter (HOSPITAL_COMMUNITY): Payer: Self-pay | Admitting: Cardiology

## 2018-03-27 ENCOUNTER — Other Ambulatory Visit: Payer: Self-pay

## 2018-03-27 ENCOUNTER — Encounter: Payer: Self-pay | Admitting: Family Medicine

## 2018-03-27 VITALS — BP 136/88 | HR 81 | Temp 98.1°F | Resp 16 | Ht 65.0 in | Wt 231.4 lb

## 2018-03-27 DIAGNOSIS — M79662 Pain in left lower leg: Secondary | ICD-10-CM | POA: Diagnosis not present

## 2018-03-27 DIAGNOSIS — M7989 Other specified soft tissue disorders: Secondary | ICD-10-CM

## 2018-03-27 DIAGNOSIS — I8002 Phlebitis and thrombophlebitis of superficial vessels of left lower extremity: Secondary | ICD-10-CM | POA: Diagnosis not present

## 2018-03-27 MED ORDER — SULFAMETHOXAZOLE-TRIMETHOPRIM 800-160 MG PO TABS: 1.0000 | ORAL_TABLET | Freq: Two times a day (BID) | ORAL | 0 refills | Status: DC

## 2018-03-27 NOTE — Progress Notes (Signed)
Patient ID: Brian Newton MRN: [id number], DOB: 05-07-1966, 52 y.o. Date of Encounter: @DATE @  Chief Complaint:  Chief Complaint  Patient presents with  . left calf pain    started this morning     HPI: 52 y.o. year old male  presents with above.   I have reviewed Dr. Samella Parr office notes from 02/24/18 and 03/03/18. The following is copied from those notes.  HPI  54/8/19 Patient reports a sudden onset of pain and swelling in his left foot. Swelling and pain began in his left ankle. He denies any injury. The ankle was warm and tender to the touch. Is gradually improved over a few days, the pain and swelling proceeded to the dorsum of the foot. That too was swollen and tender to the touch. Then the pain and swelling moved back to his medial malleolus area with swelling and tenderness in that area. There has been no injury. There is no swelling higher in the leg. He has negative Homans sign. He is questioning if this could be gout and I believe he is likely right. He is also reporting chronic daily headache. He's having uses Excedrin Migraine 5 days a week to stop his headache. 15 minutes after taking the medication the headache will subside but is concerned by the frequency at which she's taking it. Headaches are nonpulsatile with no photophobia or phonophobia. There is no nausea or vomiting but without the medication the headaches will gradually building become more severe suggesting medication overuse headache. This is gone on for several months. Of note the swelling and pain in his leg is only been for a few days.  At that time, my plan was: I believe the patient is having a gout exacerbation. Begin prednisone taper pack and reassess on Monday. Check uric acid level. Regarding his chronic daily headache, once the pain in his ankle has subsided and he is off prednisone, I would recommend trying the patient on Topamax and discontinue daily Excedrin use. If the patient does have gout, I would  use colchicine as needed for pain in his joints and depending upon the frequency possibly start allopurinol as a preventative in the future  03/03/18 Lab work was negative for gout.  Pain in his ankle and foot resolved almost immediately on prednisone.  However after discontinuation of prednisone, the patient developed warmth erythema and pain over the superior medial anterior shin.  There is swelling and tenderness in this area concerning for possible cellulitis.  He has numerous varicose veins near and around that area also raising the concern for superficial thrombophlebitis.  He has mild edema in the left lower leg.  He has a negative Homans sign.  There is no pitting edema around his ankle or lower shin.  There is only edema around the area of erythema on the upper medial shin.  I do not believe this is a DVT. Past Medical History:   Assessment & Plan:  Thrombophlebitis of superficial veins of left lower extremity - Plan: sulfamethoxazole-trimethoprim (BACTRIM DS,SEPTRA DS) 800-160 MG tablet  Cellulitis is also on the differential diagnosis.  I recommended aspirin 325 mg 1-2 tablets every 6 hours for pain and swelling, I have recommended warm compresses be applied 3-4 times a day to the affected area.  I have also recommended rest and elevation of the extremity to facilitate improvement of the swelling, and I have recommended Bactrim double strength tablets 1 p.o. twice daily for 7 days in case cellulitis is the cause.  Reassess on Monday.  Seek medical attention immediately over the weekend if the leg swelling worsens, if he develops pain in his calf or any shortness of breath    TODAY-----03/27/2018: Today he reports that after that last visit and after that last treatment -- things had resolved regarding his left leg.  Says that things had gone back to normal baseline. Says that the first time he started having any issues with his leg again was this past Saturday night which was 03/25/2018.  Says  that on Saturday night he noticed that at the medial aspect of his left knee that that area was painful and when he looked at it it looked puffy and pink and felt warm. Says that he was at work on Sunday which was yesterday and he got his work done quickly so then he could go and sit with his leg propped up.  Says that that area was sensitive to the touch even when his pants rubbed that area but otherwise had no new issues yesterday. Today this morning he was feeling pain from the posterior aspect of the left knee down the left calf and that worried him and concerned him for blood clot so he came in for evaluation.  Says that "I knew I had done nothing to pull a muscle etc. so that worried me when my left calf started hurting."  Asked if he had had issues with his left leg prior to those visits with Dr. Dennard Schaumann in March. He states that-- no-- he has not had these kinds of symptoms in the past. Says in the past before he had his gastric bypass surgery and he had significant obesity--- that back then he did have problems with his legs looking dark because of venous insufficiency and that he had to wear TED hose for swelling but has never had these kinds of issues until these episodes.  He has had no shortness of breath.  No sharp stabbing or pleuritic chest pain.   Past Medical History:  Diagnosis Date  . Arthritis   . Borderline hyperglycemia   . Headache(784.0)   . Hypercholesteremia   . Hypertension   . Metabolic syndrome   . Shingles   . Sleep apnea    uses C PAP  . Swelling    both legs  . 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 Wrist pain      Home Meds: Outpatient Medications Prior to Visit  Medication Sig Dispense Refill  . amLODipine-benazepril (LOTREL) 5-20 MG capsule TAKE (2) CAPSULES BY MOUTH ONCE EVERY MORNING. 60 capsule 11  . clotrimazole-betamethasone (LOTRISONE) cream Apply 1 application 2 (two) times daily topically. 45 g 0  . Cyanocobalamin 500  MCG/0.1ML SOLN Place 500 mcg into the nose once a week.    . ferrous sulfate 325 (65 FE) MG tablet Take 325 mg by mouth daily with breakfast.    . loratadine (CLARITIN) 10 MG tablet Take 10 mg by mouth every morning.    . Multiple Vitamins-Minerals (MULTIVITAMIN WITH MINERALS) tablet Take 1 tablet by mouth every morning.     Marland Kitchen OVER THE COUNTER MEDICATION Calcium - 600mg  - vitamin d 1500units - magnesium - 120mg  - 2 tablets bid    . OVER THE COUNTER MEDICATION Iron - 54mg  - vitamn c 90mg  - 1 qam - 2 pm    . topiramate (TOPAMAX) 25 MG tablet Take 2 tablets (50 mg total) by mouth 2 (two) times daily. 120 tablet 3  . sulfamethoxazole-trimethoprim (BACTRIM DS,SEPTRA DS)  800-160 MG tablet Take 1 tablet by mouth 2 (two) times daily. 14 tablet 0   No facility-administered medications prior to visit.     Allergies:  Allergies  Allergen Reactions  . Penicillins Rash    Social History   Socioeconomic History  . Marital status: Married    Spouse name: Not on file  . Number of children: 2  . Years of education: HS  . Highest education level: Not on file  Occupational History  . Not on file  Social Needs  . Financial resource strain: Not on file  . Food insecurity:    Worry: Not on file    Inability: Not on file  . Transportation needs:    Medical: Not on file    Non-medical: Not on file  Tobacco Use  . Smoking status: Never Smoker  . Smokeless tobacco: Never Used  Substance and Sexual Activity  . Alcohol use: No    Alcohol/week: 0.0 oz  . Drug use: No  . Sexual activity: Yes  Lifestyle  . Physical activity:    Days per week: Not on file    Minutes per session: Not on file  . Stress: Not on file  Relationships  . Social connections:    Talks on phone: Not on file    Gets together: Not on file    Attends religious service: Not on file    Active member of club or organization: Not on file    Attends meetings of clubs or organizations: Not on file    Relationship status: Not on  file  . Intimate partner violence:    Fear of current or ex partner: Not on file    Emotionally abused: Not on file    Physically abused: Not on file    Forced sexual activity: Not on file  Other Topics Concern  . Not on file  Social History Narrative   Denies caffeine use     Family History  Problem Relation Age of Onset  . Hypertension Mother   . Hypertension Other   . Hyperlipidemia Other   . Heart disease Other   . Obesity Other   . Sleep apnea Other   . Breast cancer Maternal Grandmother   . Colon cancer Maternal Grandfather      Review of Systems:  See HPI for pertinent ROS. All other ROS negative.    Physical Exam: Blood pressure 136/88, pulse 81, temperature 98.1 F (36.7 C), temperature source Oral, resp. rate 16, height 5\' 5"  (1.651 m), weight 105 kg (231 lb 6.4 oz), SpO2 96 %., Body mass index is 38.51 kg/m. General: WNWD WM. Appears in no acute distress. Neck: Supple. No thyromegaly. No lymphadenopathy. Lungs: Clear bilaterally to auscultation without wheezes, rales, or rhonchi. Breathing is unlabored. Heart: RRR with S1 S2. No murmurs, rubs, or gallops. Musculoskeletal:  Strength and tone normal for age. Extremities/Skin:  Left Leg: AnteroMedial Aspect of Left Knee - There is area of very faint pink erythema---This area measures < 1 inch diameter. This area is tender with palpation.  There is no other area abnormality on inspection.  No other area with any erythema.  No other area with swelling.  The only other abnormality with inspection is some varicose veins scattered through his calf but these are not pink or warm. There is no area of warmth to the touch. Negative Homan. Neuro: Alert and oriented X 3. Moves all extremities spontaneously. Gait is normal. CNII-XII grossly in tact. Psych:  Responds to questions appropriately  with a normal affect.     ASSESSMENT AND PLAN:  52 y.o. year old male with  1. Pain of left calf He is concerned of DVT so will  get venous Doppler for reassurance.  Reviewed that he is afebrile and his oxygen sat is 96% on room air.   If doppler is negative, will treat him for superficial thrombophlebitis with aspirin and Bactrim. - VAS [place] LOWER EXTREMITY VENOUS (DVT); Future  2. Swelling of calf - VAS [place] LOWER EXTREMITY VENOUS (DVT); Future  Addendum added later on 03/27/2018---- Preliminary report from venous Doppler is negative for DVT.  I just called and spoke with patient and discussed this.  Also discussed with him that he is to take aspirin 325 mg 1 tablet every 6 hours until pain and swelling resolve --for up to 4 days.  Also apply warm compresses to the area 3-4 times per day.  Elevate extremity.  Also to take Bactrim DS 1 twice daily times 7 days.  Follow-up if symptoms and findings worsen or do not resolve within 1 week.  Signed, [address] [place], [place], BSF[facility] 03/27/2018 9:00 AM

## 2018-04-10 ENCOUNTER — Other Ambulatory Visit: Payer: Self-pay | Admitting: Family Medicine

## 2018-05-17 DIAGNOSIS — Z1211 Encounter for screening for malignant neoplasm of colon: Secondary | ICD-10-CM | POA: Diagnosis not present

## 2018-05-25 DIAGNOSIS — K080 Exfoliation of teeth due to systemic causes: Secondary | ICD-10-CM | POA: Diagnosis not present

## 2018-06-08 DIAGNOSIS — Z1211 Encounter for screening for malignant neoplasm of colon: Secondary | ICD-10-CM | POA: Diagnosis not present

## 2018-06-08 LAB — HM COLONOSCOPY

## 2018-06-30 ENCOUNTER — Encounter: Payer: Self-pay | Admitting: *Deleted

## 2018-07-18 ENCOUNTER — Encounter: Payer: Self-pay | Admitting: *Deleted

## 2018-07-23 ENCOUNTER — Encounter: Payer: Self-pay | Admitting: Neurology

## 2018-07-24 ENCOUNTER — Ambulatory Visit: Payer: Federal, State, Local not specified - PPO | Admitting: Neurology

## 2018-07-24 ENCOUNTER — Encounter: Payer: Self-pay | Admitting: Neurology

## 2018-07-24 VITALS — BP 145/93 | HR 69 | Ht 65.0 in | Wt 228.0 lb

## 2018-07-24 DIAGNOSIS — G4733 Obstructive sleep apnea (adult) (pediatric): Secondary | ICD-10-CM | POA: Diagnosis not present

## 2018-07-24 DIAGNOSIS — Z9989 Dependence on other enabling machines and devices: Secondary | ICD-10-CM | POA: Diagnosis not present

## 2018-07-24 NOTE — Progress Notes (Signed)
Subjective:    Patient ID: Brian Newton is a 52 y.o. male.  HPI     Interim history:   Brian Newton is a 52 year old right-handed gentleman with an underlying medical history of hyperlipidemia, migraine HAs, hypertension, arthritis, borderline diabetes and morbid obesity, who presents for follow-up consultation of his obstructive sleep apnea, on AutoPap therapy. The patient is unaccompanied today. I last saw him on 07/19/2017, at which time he was doing well, needed a new machine. He was fully compliant with AutoPap. His machine was older and was acting up.  Today, 07/24/2018: I reviewed his AutoPap compliance data from 06/20/2018 through 07/19/2018, which is a total of 30 days, during which time he used his AutoPap every night with percent used days greater than 4 hours at 100%, indicating superb compliance with an average usage of 7 hours and 14 minutes, residual AHI at goal at 1 per hour, 95th percentile pressure at 10.7, leak acceptable, range of 6 cm to 12 cm with EPR. He reports doing very well. He is typically up to date with his supplies. His weight fluctuates a little bit, he continues to take all his supplements on a regular basis. He recently started having recurrent headaches and has a long-standing history of migraines he reports. He was given a prescription for Topamax by his PCP but never actually started it as his headaches improved. He reports that Excedrin Migraine helped but rather than taking that daily or regularly he finds that drinking diet ice tea helps. He is aware that caffeine withdrawal can also cause recurrent headaches.    The patient's allergies, current medications, family history, past medical history, past social history, past surgical history and problem list were reviewed and updated as appropriate.    Previously (copied from previous notes for reference):    I saw him on 07/13/2016, at which time he was compliant with his AutoPap. We talked about his sleep  study results from 11/04/2015. He reported doing well. He had some interim weight loss. He was followed by nutrition on a regular basis since his weight loss surgery. He also reported that his migraine headaches had improved. I suggested a one-year checkup.   I reviewed his AutoPAP compliance data from 06/14/2017 through 07/13/2017 which is a total of 30 days, during which time he used his machine every night with percent used days greater than 4 hours at 100%, indicating superb compliance with an average usage of 6 hours and 40 minutes, residual AHI 3 per hour, leak is on the high side with the 95th percentile at 30/L per minute, 95th percentile pressure at 10.6 cm, range of 5-15.    I first met him on 10/06/2015 at the request of his primary care physician, at which time he reported a prior diagnosis of OSA and treatment with CPAP but significant weight loss after his gastric bypass surgery in November 2015. I invited him back for sleep study for reevaluation. He had a baseline sleep study on 11/04/2015. I went over his test results with him in detail today. His sleep efficiency was normal at 90.4% with a sleep latency of 6.5 minutes and wake after sleep onset of 41 minutes with moderate sleep fragmentation noted. He had an increased percentage of stage II sleep, absence of slow-wave sleep and a normal percentage of REM sleep with a normal REM latency. He had no significant PLMS, EKG or EEG changes. He had mild to moderate snoring. Total AHI was mildly elevated at 9.1 per hour,  rising to 14.3 per hour in the supine position and 11.7 per hour during REM sleep. Average oxygen saturation was 94%, nadir was 84%. Time below 90% saturation was 20 minutes, time below 88% saturation was 6 minutes. Based on his test results suggested treatment in the form of AutoPap at home.    Of note, he was seen in the interim by nurse practitioner, Ward Givens on 01/14/2016, at which time he reported improved sleep with  AutoPap but complained of mouth dryness. A CPAP download was not obtainable at the time.   I reviewed his AutoPap compliance data from 06/01/2016 through 07/03/2016 which is a total of 30 days during which time he used his machine every day with percent used days greater than 4 hours at 96%, indicating excellent compliance with an average usage of 6 hours and 18 minutes, residual AHI 3.1 per hour, 95th percentile pressure of 9.8 cm, leak at times high with the 95th percentile at 30 L/m, minimum pressure 5 cm, maximum pressure 15 cm.   10/06/2015: He was previously diagnosed with obstructive sleep apnea and placed on CPAP therapy. He had weight loss surgery with gastric bypass on 11/05/2014 after which she lost a significant amount of weight. He is not fully compliant with CPAP therapy at this time. I reviewed your office note from 10/02/2015. The patient presented recently with a rash and was diagnosed with shingles.   Prior sleep test results were reviewed from 2006: He had a baseline sleep study on 08/16/2005 08/16/05, which showed a total AHI of 30.6 and an oxyhemoglobin desaturation nadir of 64%, in keeping with severe obstructive sleep apnea. He had a CPAP titration study on 09/27/2005 which showed an AHI of 0.8 per hour on a pressure of 14 cm and oxyhemoglobin desaturation nadir was 87% during that study. He was always compliant with CPAP therapy until March of this year when he started having a lot of dry mouth. His machine is over 47 years old. He has not been using it since March 2016. His BMI at the time of his weight loss surgery was 52.59. He has lost over 100 pounds. He does not drink caffeine, or alcohol, and does not smoke. He works for [employer]. He denies nocturia, RLS Sx or morning HAs. His bedtime is around 7 PM. Rise time varies a little bit depending on how early he has to go to work. Usual work time is 5 AM to 1:30 PM. His usual rise time is 3 AM. His Epworth sleepiness score is 15 out of 24  today, his fatigue score is 24 out of 63. His biggest sleep-related complaint at this time is daytime sleepiness which is worse in the afternoons.   He has 2 daughters, ages 33 and 68.  His Past Medical History Is Significant For: Past Medical History:  Diagnosis Date  . Arthritis   . Borderline hyperglycemia   . Headache(784.0)   . Hypercholesteremia   . Hypertension   . Metabolic syndrome   . Shingles   . Sleep apnea    uses C PAP  . Swelling    both legs  . 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 Wrist pain     His Past Surgical History Is Significant For: Past Surgical History:  Procedure Laterality Date  . APPENDECTOMY  1986  . BREATH TEK H PYLORI N/A 08/08/2014   Procedure: BREATH TEK H PYLORI;  Surgeon: Edward Jolly, MD;  Location: [facility];  Service: General;  Laterality: N/A;  .  COLONOSCOPY N/A 03/02/2013   Procedure: COLONOSCOPY;  Surgeon: Beryle Beams, MD;  Location: [facility];  Service: Endoscopy;  Laterality: N/A;  . 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 KNEE ARTHROSCOPY  2001   rt knee  . UPPER GI ENDOSCOPY  11/05/2014   Procedure: UPPER GI ENDOSCOPY;  Surgeon: Excell Seltzer, MD;  Location: [facility];  Service: General;;    His Family History Is Significant For: Family History  Problem Relation Age of Onset  . Hypertension Mother   . Hypertension Other   . Hyperlipidemia Other   . Heart disease Other   . Obesity Other   . Sleep apnea Other   . Breast cancer Maternal Grandmother   . Colon cancer Maternal Grandfather     His Social History Is Significant For: Social History   Socioeconomic History  . Marital status: Married    Spouse name: Not on file  . Number of children: 2  . Years of education: HS  . Highest education level: Not on file  Occupational  History  . Not on file  Social Needs  . Financial resource strain: Not on file  . Food insecurity:    Worry: Not on file    Inability: Not on file  . Transportation needs:    Medical: Not on file    Non-medical: Not on file  Tobacco Use  . Smoking status: Never Smoker  . Smokeless tobacco: Never Used  Substance and Sexual Activity  . Alcohol use: No    Alcohol/week: 0.0 oz  . Drug use: No  . Sexual activity: Yes  Lifestyle  . Physical activity:    Days per week: Not on file    Minutes per session: Not on file  . Stress: Not on file  Relationships  . Social connections:    Talks on phone: Not on file    Gets together: Not on file    Attends religious service: Not on file    Active member of club or organization: Not on file    Attends meetings of clubs or organizations: Not on file    Relationship status: Not on file  Other Topics Concern  . Not on file  Social History Narrative   Denies caffeine use     His Allergies Are:  Allergies  Allergen Reactions  . Penicillins Rash  :   His Current Medications Are:  Outpatient Encounter Medications as of 07/24/2018  Medication Sig  . amLODipine-benazepril (LOTREL) 5-20 MG capsule TAKE (2) CAPSULES BY MOUTH ONCE EVERY MORNING.  . clotrimazole-betamethasone (LOTRISONE) cream APPLY TO THE AFFECTED AREA TOPICALLY TWICE DAILY.  Marland Kitchen Cyanocobalamin 500 MCG/0.1ML SOLN Place 500 mcg into the nose once a week.  . ferrous sulfate 325 (65 FE) MG tablet Take 325 mg by mouth daily with breakfast.  . loratadine (CLARITIN) 10 MG tablet Take 10 mg by mouth every morning.  . Multiple Vitamins-Minerals (MULTIVITAMIN WITH MINERALS) tablet Take 1 tablet by mouth every morning.   Marland Kitchen OVER THE COUNTER MEDICATION Calcium - 686m - vitamin d 1500units - magnesium - 1256m- 2 tablets bid  . OVER THE COUNTER MEDICATION Iron - 5438m vitamn c 73m5m1 qam - 2 pm  . [DISCONTINUED] sulfamethoxazole-trimethoprim (BACTRIM DS,SEPTRA DS) 800-160 MG tablet Take 1  tablet by mouth 2 (two) times daily.  . [DISCONTINUED] topiramate (TOPAMAX) 25 MG  tablet Take 2 tablets (50 mg total) by mouth 2 (two) times daily.   No facility-administered encounter medications on file as of 07/24/2018.   :  Review of Systems:  Out of a complete 14 point review of systems, all are reviewed and negative with the exception of these symptoms as listed below: Review of Systems  Neurological:       Patient reports that he has been doing very well.     Objective:  Neurological Exam  Physical Exam Physical Examination:   Vitals:   07/24/18 1428  BP: (!) 145/93  Pulse: 69    General Examination: The patient is a very pleasant 52 y.o. male in no acute distress. He appears well-developed and well-nourished and well groomed.   HEENT:Normocephalic, atraumatic, pupils are equal, round and reactive to light and accommodation. Corrective eyeglasses in place. Extraocular tracking is good without limitation to gaze excursion or nystagmus noted. Normal smooth pursuit is noted. Hearing is grossly intact. Face is symmetric with normal facial animation and normal facial sensation. Speech is clear with no dysarthria noted. There is no hypophonia. There is no lip, neck/head, jaw or voice tremor. Neck with FROM. Oropharynx exam reveals: mild mouth dryness, good dental hygiene and moderate airway crowding. Tongue protrudes centrally and palate elevates symmetrically.  Chest:Clear to auscultation without wheezing, rhonchi or crackles noted.  Heart:S1+S2+0, regular and normal without murmurs, rubs or gallops noted.   Abdomen:Soft, non-tender and non-distended with normal bowel sounds appreciated on auscultation.  Extremities:There is no pitting edema in the distal lower extremities bilaterally.  Skin: Warm and dry without trophic changes noted.   Musculoskeletal: exam reveals no obvious joint deformities, tenderness or joint swelling or erythema, with mild arthritic changes  noticed in the hands.   Neurologically:  Mental status: The patient is awake, alert and oriented in all 4 spheres. His immediate and remote memory, attention, language skills and fund of knowledge are appropriate. There is no evidence of aphasia, agnosia, apraxia or anomia. Speech is clear with normal prosody and enunciation. Thought process is linear. Mood is normal and affect is normal.  Cranial nerves II - XII are as described above under HEENT exam. In addition: shoulder shrug is normal with equal shoulder height noted. Motor exam: Normal bulk, strength and tone is noted. There is no drift, tremor or rebound. Romberg is negative. Fine motor skills and coordination: intact grossly.   Cerebellar testing: No dysmetria or intention tremor. There is no truncal or gait ataxia.  Sensory exam: intact to light touch in the upper and lower extremities.  Gait, station and balance: He stands easily. No veering to one side is noted. No leaning to one side is noted. Posture is age-appropriate and stance is narrow based. Gait shows normal stride length and normal pace. No problems turning are noted. Tandem walk is unremarkable.   Assessment and Plan:  In summary, Brian Newton is a very pleasant 52 year old male with an underlying medical history of hyperlipidemia, hypertension, arthritis, borderline diabetes and morbid obesity with status post gastric bypass surgery in November 2015 with significant weight loss achieved, who was previously diagnosed with severe obstructive sleep apnea and placed on CPAP therapy. He was compliant with CPAP therapy for 10 years butneeded re-evaluation post weight loss surgery. He had a sleep study in November 2016 which showed mild residual obstructive sleep apnea and he has been on AutoPap therapy for this. He has been fully compliant with treatment and is commended for his treatment adherence. He  has ongoing good results and was able to get a new machine last year. He is  advised to follow-up routinely in one year, he can see Vaughan Browner next time again, NP. I answered all his questions today and he was in agreement. I spent 15 minutes in total face-to-face time with the patient, more than 50% of which was spent in counseling and coordination of care, reviewing test results, reviewing medication and discussing or reviewing the diagnosis of OSA, its prognosis and treatment options. Pertinent laboratory and imaging test results that were available during this visit with the patient were reviewed by me and considered in my medical decision making (see chart for details).

## 2018-07-24 NOTE — Patient Instructions (Addendum)
Please continue using your CPAP regularly. While your insurance requires that you use CPAP at least 4 hours each night on 70% of the nights, I recommend, that you not skip any nights and use it throughout the night if you can. Getting used to CPAP and staying with the treatment long term does take time and patience and discipline. Untreated obstructive sleep apnea when it is moderate to severe can have an adverse impact on cardiovascular health and raise her risk for heart disease, arrhythmias, hypertension, congestive heart failure, stroke and diabetes. Untreated obstructive sleep apnea causes sleep disruption, nonrestorative sleep, and sleep deprivation. This can have an impact on your day to day functioning and cause daytime sleepiness and impairment of cognitive function, memory loss, mood disturbance, and problems focussing. Using CPAP regularly can improve these symptoms.  Keep up the good work!   We can see you in 1 year, you can see Jinny Blossom again next time.

## 2018-09-19 ENCOUNTER — Other Ambulatory Visit: Payer: Self-pay | Admitting: Family Medicine

## 2018-09-22 ENCOUNTER — Encounter: Payer: Self-pay | Admitting: Family Medicine

## 2018-09-22 ENCOUNTER — Ambulatory Visit: Payer: Federal, State, Local not specified - PPO | Admitting: Family Medicine

## 2018-09-22 VITALS — BP 152/90 | HR 68 | Temp 97.5°F | Resp 18 | Ht 65.0 in | Wt 225.0 lb

## 2018-09-22 DIAGNOSIS — L03312 Cellulitis of back [any part except buttock]: Secondary | ICD-10-CM

## 2018-09-22 MED ORDER — DOXYCYCLINE HYCLATE 100 MG PO TABS: 100.0000 mg | ORAL_TABLET | Freq: Two times a day (BID) | ORAL | 0 refills | Status: DC

## 2018-09-22 NOTE — Progress Notes (Signed)
Subjective:    Patient ID: Brian Newton, male    DOB: [birth year], 52 y.o.   MRN: [id number]  HPI There is a patch of skin on the patient's right mid back that is red hot and painful.  It appears to be near an insect bite.  The patch of skin is 12 cm x 6 cm.  It is warm to the touch.  It is tender to the touch.  There is an area of central clearing with an erythematous border.  The area of central clearing appears to be the area where he was bitten by an insect.  He is uncertain of what bit him.  He noticed that yesterday.  He denies any fevers or chills.  He has been complaining however of a headache and some muscle aches since the last 2 days Past Medical History:  Diagnosis Date  . Arthritis   . Borderline hyperglycemia   . Headache(784.0)   . Hypercholesteremia   . Hypertension   . Metabolic syndrome   . Shingles   . Sleep apnea    uses C PAP  . Swelling    both legs  . 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 Wrist pain    Past Surgical History:  Procedure Laterality Date  . APPENDECTOMY  1986  . BREATH TEK H PYLORI N/A 08/08/2014   Procedure: BREATH TEK H PYLORI;  Surgeon: Edward Jolly, MD;  Location: [facility];  Service: General;  Laterality: N/A;  . COLONOSCOPY N/A 03/02/2013   Procedure: COLONOSCOPY;  Surgeon: Beryle Beams, MD;  Location: [facility];  Service: Endoscopy;  Laterality: N/A;  . 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 KNEE ARTHROSCOPY  2001   rt knee  . UPPER GI ENDOSCOPY  11/05/2014   Procedure: UPPER GI ENDOSCOPY;  Surgeon: Excell Seltzer, MD;  Location: [facility];  Service: General;;   Current Outpatient Medications on File Prior to Visit  Medication Sig Dispense Refill  . amLODipine-benazepril (LOTREL) 5-20 MG capsule TAKE (2) CAPSULES BY MOUTH ONCE EVERY MORNING. 60  capsule 0  . clotrimazole-betamethasone (LOTRISONE) cream APPLY TO THE AFFECTED AREA TOPICALLY TWICE DAILY. 45 g 0  . Cyanocobalamin 500 MCG/0.1ML SOLN Place 500 mcg into the nose once a week.    . ferrous sulfate 325 (65 FE) MG tablet Take 325 mg by mouth daily with breakfast.    . loratadine (CLARITIN) 10 MG tablet Take 10 mg by mouth every morning.    . Multiple Vitamins-Minerals (MULTIVITAMIN WITH MINERALS) tablet Take 1 tablet by mouth every morning.     Marland Kitchen OVER THE COUNTER MEDICATION Calcium - 600mg  - vitamin d 1500units - magnesium - 120mg  - 2 tablets bid    . OVER THE COUNTER MEDICATION Iron - 54mg  - vitamn c 90mg  - 1 qam - 2 pm     No current facility-administered medications on file prior to visit.    Allergies  Allergen Reactions  . Penicillins Rash   Social History   Socioeconomic History  . Marital status: Married    Spouse name: Not on file  . Number of children: 2  . Years of education: HS  . Highest education level: Not on file  Occupational History  . Not on file  Social Needs  . Financial resource strain: Not on file  . Food insecurity:    Worry:  Not on file    Inability: Not on file  . Transportation needs:    Medical: Not on file    Non-medical: Not on file  Tobacco Use  . Smoking status: Never Smoker  . Smokeless tobacco: Never Used  Substance and Sexual Activity  . Alcohol use: No    Alcohol/week: 0.0 standard drinks  . Drug use: No  . Sexual activity: Yes  Lifestyle  . Physical activity:    Days per week: Not on file    Minutes per session: Not on file  . Stress: Not on file  Relationships  . Social connections:    Talks on phone: Not on file    Gets together: Not on file    Attends religious service: Not on file    Active member of club or organization: Not on file    Attends meetings of clubs or organizations: Not on file    Relationship status: Not on file  . Intimate partner violence:    Fear of current or ex partner: Not on file     Emotionally abused: Not on file    Physically abused: Not on file    Forced sexual activity: Not on file  Other Topics Concern  . Not on file  Social History Narrative   Denies caffeine use      Review of Systems  All other systems reviewed and are negative.      Objective:   Physical Exam  Cardiovascular: Normal rate, regular rhythm and normal heart sounds.  Pulmonary/Chest: Effort normal and breath sounds normal. No stridor. No respiratory distress. He has no wheezes. He has no rales.  Musculoskeletal:       Back:  Skin: There is erythema.     Vitals reviewed.         Assessment & Plan:  Cellulitis of back except buttock  Appears to be developing cellulitis after an insect bite.  Begin doxycycline 100 mg p.o. twice daily for 10 days.  This would cover cellulitis as well as possible early Lyme disease.  Recheck next week if no better or sooner if worse

## 2018-10-17 ENCOUNTER — Other Ambulatory Visit: Payer: Self-pay | Admitting: Family Medicine

## 2018-10-26 ENCOUNTER — Other Ambulatory Visit: Payer: Federal, State, Local not specified - PPO

## 2018-10-26 DIAGNOSIS — Z0000 Encounter for general adult medical examination without abnormal findings: Secondary | ICD-10-CM | POA: Diagnosis not present

## 2018-10-26 DIAGNOSIS — E7800 Pure hypercholesterolemia, unspecified: Secondary | ICD-10-CM | POA: Diagnosis not present

## 2018-10-26 DIAGNOSIS — Z125 Encounter for screening for malignant neoplasm of prostate: Secondary | ICD-10-CM

## 2018-10-26 DIAGNOSIS — Z9884 Bariatric surgery status: Secondary | ICD-10-CM | POA: Diagnosis not present

## 2018-10-26 DIAGNOSIS — I10 Essential (primary) hypertension: Secondary | ICD-10-CM

## 2018-10-27 LAB — COMPREHENSIVE METABOLIC PANEL
AG RATIO: 2 (calc) (ref 1.0–2.5)
ALBUMIN MSPROF: 4.1 g/dL (ref 3.6–5.1)
ALKALINE PHOSPHATASE (APISO): 90 U/L (ref 40–115)
ALT: 22 U/L (ref 9–46)
AST: 23 U/L (ref 10–35)
BILIRUBIN TOTAL: 0.5 mg/dL (ref 0.2–1.2)
BUN: 19 mg/dL (ref 7–25)
CALCIUM: 9.3 mg/dL (ref 8.6–10.3)
CO2: 28 mmol/L (ref 20–32)
Chloride: 104 mmol/L (ref 98–110)
Creat: 0.95 mg/dL (ref 0.70–1.33)
Globulin: 2.1 g/dL (calc) (ref 1.9–3.7)
Glucose, Bld: 88 mg/dL (ref 65–99)
POTASSIUM: 4.3 mmol/L (ref 3.5–5.3)
Sodium: 140 mmol/L (ref 135–146)
Total Protein: 6.2 g/dL (ref 6.1–8.1)

## 2018-10-27 LAB — CBC WITH DIFFERENTIAL/PLATELET
Basophils Absolute: 21 cells/uL (ref 0–200)
Basophils Relative: 0.3 %
EOS PCT: 2.8 %
Eosinophils Absolute: 199 cells/uL (ref 15–500)
HEMATOCRIT: 45 % (ref 38.5–50.0)
Hemoglobin: 15.1 g/dL (ref 13.2–17.1)
LYMPHS ABS: 2173 {cells}/uL (ref 850–3900)
MCH: 29.9 pg (ref 27.0–33.0)
MCHC: 33.6 g/dL (ref 32.0–36.0)
MCV: 89.1 fL (ref 80.0–100.0)
MONOS PCT: 8.3 %
MPV: 10.2 fL (ref 7.5–12.5)
NEUTROS PCT: 58 %
Neutro Abs: 4118 cells/uL (ref 1500–7800)
PLATELETS: 352 10*3/uL (ref 140–400)
RBC: 5.05 10*6/uL (ref 4.20–5.80)
RDW: 12.9 % (ref 11.0–15.0)
TOTAL LYMPHOCYTE: 30.6 %
WBC mixed population: 589 cells/uL (ref 200–950)
WBC: 7.1 10*3/uL (ref 3.8–10.8)

## 2018-10-27 LAB — LIPID PANEL
CHOLESTEROL: 162 mg/dL (ref <200)
HDL: 53 mg/dL (ref >40)
LDL Cholesterol (Calc): 96 mg/dL (calc)
Non-HDL Cholesterol (Calc): 109 mg/dL (calc) (ref <130)
TRIGLYCERIDES: 43 mg/dL (ref <150)
Total CHOL/HDL Ratio: 3.1 (calc) (ref <5.0)

## 2018-10-27 LAB — VITAMIN B12: Vitamin B-12: 1345 pg/mL — ABNORMAL HIGH (ref 200–1100)

## 2018-10-27 LAB — HEMOGLOBIN A1C
EAG (MMOL/L): 6.3 (calc)
Hgb A1c MFr Bld: 5.6 % of total Hgb (ref <5.7)
Mean Plasma Glucose: 114 (calc)

## 2018-10-27 LAB — PSA: PSA: 0.7 ng/mL (ref <=4.0)

## 2018-10-27 LAB — VITAMIN D 25 HYDROXY (VIT D DEFICIENCY, FRACTURES): Vit D, 25-Hydroxy: 43 ng/mL (ref 30–100)

## 2018-10-27 LAB — IRON: IRON: 104 ug/dL (ref 50–180)

## 2018-11-02 ENCOUNTER — Encounter: Payer: Self-pay | Admitting: Family Medicine

## 2018-11-02 ENCOUNTER — Ambulatory Visit (INDEPENDENT_AMBULATORY_CARE_PROVIDER_SITE_OTHER): Payer: Federal, State, Local not specified - PPO | Admitting: Family Medicine

## 2018-11-02 VITALS — BP 120/70 | HR 60 | Temp 98.2°F | Resp 16 | Ht 65.0 in | Wt 222.0 lb

## 2018-11-02 DIAGNOSIS — Z0000 Encounter for general adult medical examination without abnormal findings: Secondary | ICD-10-CM

## 2018-11-02 DIAGNOSIS — Z23 Encounter for immunization: Secondary | ICD-10-CM

## 2018-11-02 NOTE — Addendum Note (Signed)
Addended by: Ofilia Neas R on: 11/02/2018 10:45 AM   Modules accepted: Orders

## 2018-11-02 NOTE — Progress Notes (Signed)
Subjective:    Patient ID: Brian Newton, male    DOB: 11-08-1966, 52 y.o.   MRN: [id number]  Patient is here today for complete physical exam.  Past medical history significant for gastric bypass.  He has a history of hypertension, hyperlipidemia, dyslipidemia, and metabolic syndrome.  However with his substantial weight loss, he dropped his belt size from over 50-38, he is essentially cured his metabolic syndrome.  His blood pressure is well controlled today at 120/70.  He denies any chest pain shortness of breath or dyspnea on exertion.  His most recent lab work is listed below.  His colonoscopy was performed this year and is not due again for 7 years due to history of polyp.  His most recent PSA is excellent.  He had his flu shot at work.  He is not a diabetic and therefore does not require an eye exam or foot exam. Lab on 10/26/2018  Component Date Value Ref Range Status  . Glucose, Bld 10/26/2018 88  65 - 99 mg/dL Final   Comment: .            Fasting reference interval .   . BUN 10/26/2018 19  7 - 25 mg/dL Final  . Creat 10/26/2018 0.95  0.70 - 1.33 mg/dL Final   Comment: For patients >68 years of age, the reference limit for Creatinine is approximately 13% higher for people identified as African-American. .   Havery Moros Ratio 76/28/3151 NOT APPLICABLE  6 - 22 (calc) Final  . Sodium 10/26/2018 140  135 - 146 mmol/L Final  . Potassium 10/26/2018 4.3  3.5 - 5.3 mmol/L Final  . Chloride 10/26/2018 104  98 - 110 mmol/L Final  . CO2 10/26/2018 28  20 - 32 mmol/L Final  . Calcium 10/26/2018 9.3  8.6 - 10.3 mg/dL Final  . Total Protein 10/26/2018 6.2  6.1 - 8.1 g/dL Final  . Albumin 10/26/2018 4.1  3.6 - 5.1 g/dL Final  . Globulin 10/26/2018 2.1  1.9 - 3.7 g/dL (calc) Final  . AG Ratio 10/26/2018 2.0  1.0 - 2.5 (calc) Final  . Total Bilirubin 10/26/2018 0.5  0.2 - 1.2 mg/dL Final  . Alkaline phosphatase (APISO) 10/26/2018 90  40 - 115 U/L Final  . AST 10/26/2018 23  10 - 35  U/L Final  . ALT 10/26/2018 22  9 - 46 U/L Final  . PSA 10/26/2018 0.7  < OR = 4.0 ng/mL Final   Comment: The total PSA value from this assay system is  standardized against the WHO standard. The test  result will be approximately 20% lower when compared  to the equimolar-standardized total PSA (Beckman  Coulter). Comparison of serial PSA results should be  interpreted with this fact in mind. . This test was performed using the Siemens  chemiluminescent method. Values obtained from  different assay methods cannot be used interchangeably. PSA levels, regardless of value, should not be interpreted as absolute evidence of the presence or absence of disease.   . WBC 10/26/2018 7.1  3.8 - 10.8 Thousand/uL Final  . RBC 10/26/2018 5.05  4.20 - 5.80 Million/uL Final  . Hemoglobin 10/26/2018 15.1  13.2 - 17.1 g/dL Final  . HCT 10/26/2018 45.0  38.5 - 50.0 % Final  . MCV 10/26/2018 89.1  80.0 - 100.0 fL Final  . MCH 10/26/2018 29.9  27.0 - 33.0 pg Final  . MCHC 10/26/2018 33.6  32.0 - 36.0 g/dL Final  . RDW 10/26/2018 12.9  11.0 -  15.0 % Final  . Platelets 10/26/2018 352  140 - 400 Thousand/uL Final  . MPV 10/26/2018 10.2  7.5 - 12.5 fL Final  . Neutro Abs 10/26/2018 4,118  1,500 - 7,800 cells/uL Final  . Lymphs Abs 10/26/2018 2,173  850 - 3,900 cells/uL Final  . WBC mixed population 10/26/2018 589  200 - 950 cells/uL Final  . Eosinophils Absolute 10/26/2018 199  15 - 500 cells/uL Final  . Basophils Absolute 10/26/2018 21  0 - 200 cells/uL Final  . Neutrophils Relative % 10/26/2018 58  % Final  . Total Lymphocyte 10/26/2018 30.6  % Final  . Monocytes Relative 10/26/2018 8.3  % Final  . Eosinophils Relative 10/26/2018 2.8  % Final  . Basophils Relative 10/26/2018 0.3  % Final  . Cholesterol 10/26/2018 162  <200 mg/dL Final  . HDL 10/26/2018 53  >40 mg/dL Final  . Triglycerides 10/26/2018 43  <150 mg/dL Final  . LDL Cholesterol (Calc) 10/26/2018 96  mg/dL (calc) Final   Comment:  Reference range: <100 . Desirable range <100 mg/dL for primary prevention;   <70 mg/dL for patients with CHD or diabetic patients  with > or = 2 CHD risk factors. Marland Kitchen LDL-C is now calculated using the Martin-Hopkins  calculation, which is a validated novel method providing  better accuracy than the Friedewald equation in the  estimation of LDL-C.  Cresenciano Genre et al. Annamaria Helling. 1610;960(45): 2061-2068  (http://education.[facility].com/faq/FAQ164)   . Total CHOL/HDL Ratio 10/26/2018 3.1  <5.0 (calc) Final  . Non-HDL Cholesterol (Calc) 10/26/2018 109  <130 mg/dL (calc) Final   Comment: For patients with diabetes plus 1 major ASCVD risk  factor, treating to a non-HDL-C goal of <100 mg/dL  (LDL-C of <70 mg/dL) is considered a therapeutic  option.   . Hgb A1c MFr Bld 10/26/2018 5.6  <5.7 % of total Hgb Final   Comment: For the purpose of screening for the presence of diabetes: . <5.7%       Consistent with the absence of diabetes 5.7-6.4%    Consistent with increased risk for diabetes             (prediabetes) > or =6.5%  Consistent with diabetes . This assay result is consistent with a decreased risk of diabetes. . Currently, no consensus exists regarding use of hemoglobin A1c for diagnosis of diabetes in children. . According to American Diabetes Association (ADA) guidelines, hemoglobin A1c <7.0% represents optimal control in non-pregnant diabetic patients. Different metrics may apply to specific patient populations.  Standards of Medical Care in Diabetes(ADA). .   . Mean Plasma Glucose 10/26/2018 114  (calc) Final  . eAG (mmol/L) 10/26/2018 6.3  (calc) Final  . Vit D, 25-Hydroxy 10/26/2018 43  30 - 100 ng/mL Final   Comment: Vitamin D Status         25-OH Vitamin D: . Deficiency:                    <20 ng/mL Insufficiency:             20 - 29 ng/mL Optimal:                 > or = 30 ng/mL . For 25-OH Vitamin D testing on patients on  D2-supplementation and patients for  whom quantitation  of D2 and D3 fractions is required, the [facility]ureD(TM) 25-OH VIT D, (D2,D3), LC/MS/MS is recommended: order  code [id number] (patients >38yrs). . For more information on this test, go to: http://education.[facility].com/faq/FAQ163 (This  link is being provided for  informational/educational purposes only.)   . Iron 10/26/2018 104  50 - 180 mcg/dL Final  . Vitamin B-12 10/26/2018 1,345* 200 - 1,100 pg/mL Final    Past Medical History:  Diagnosis Date  . Arthritis   . Borderline hyperglycemia   . Headache(784.0)   . Hypercholesteremia   . Hypertension   . Metabolic syndrome   . Shingles   . Sleep apnea    uses C PAP  . Swelling    both legs  . 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 Wrist pain    Current Outpatient Medications on File Prior to Visit  Medication Sig Dispense Refill  . amLODipine-benazepril (LOTREL) 5-20 MG capsule TAKE (2) CAPSULES BY MOUTH ONCE EVERY MORNING. 60 capsule 0  . clotrimazole-betamethasone (LOTRISONE) cream APPLY TO THE AFFECTED AREA TOPICALLY TWICE DAILY. 45 g 0  . Cyanocobalamin 500 MCG/0.1ML SOLN Place 500 mcg into the nose once a week.    . doxycycline (VIBRA-TABS) 100 MG tablet Take 1 tablet (100 mg total) by mouth 2 (two) times daily. 20 tablet 0  . ferrous sulfate 325 (65 FE) MG tablet Take 325 mg by mouth daily with breakfast.    . loratadine (CLARITIN) 10 MG tablet Take 10 mg by mouth every morning.    . Multiple Vitamins-Minerals (MULTIVITAMIN WITH MINERALS) tablet Take 1 tablet by mouth every morning.     Marland Kitchen OVER THE COUNTER MEDICATION Calcium - 600mg  - vitamin d 1500units - magnesium - 120mg  - 2 tablets bid    . OVER THE COUNTER MEDICATION Iron - 54mg  - vitamn c 90mg  - 1 qam - 2 pm     No current facility-administered medications on file prior to visit.    Allergies  Allergen Reactions  . Penicillins Rash   Social History   Socioeconomic History  . Marital status: Married    Spouse name: Not  on file  . Number of children: 2  . Years of education: HS  . Highest education level: Not on file  Occupational History  . Not on file  Social Needs  . Financial resource strain: Not on file  . Food insecurity:    Worry: Not on file    Inability: Not on file  . Transportation needs:    Medical: Not on file    Non-medical: Not on file  Tobacco Use  . Smoking status: Never Smoker  . Smokeless tobacco: Never Used  Substance and Sexual Activity  . Alcohol use: No    Alcohol/week: 0.0 standard drinks  . Drug use: No  . Sexual activity: Yes  Lifestyle  . Physical activity:    Days per week: Not on file    Minutes per session: Not on file  . Stress: Not on file  Relationships  . Social connections:    Talks on phone: Not on file    Gets together: Not on file    Attends religious service: Not on file    Active member of club or organization: Not on file    Attends meetings of clubs or organizations: Not on file    Relationship status: Not on file  . Intimate partner violence:    Fear of current or ex partner: Not on file    Emotionally abused: Not on file    Physically abused: Not on file    Forced sexual activity: Not on file  Other Topics Concern  . Not on file  Social History Narrative   Denies caffeine  use       Review of Systems  All other systems reviewed and are negative.      Objective:   Physical Exam  Constitutional: He is oriented to person, place, and time. He appears well-developed and well-nourished. No distress.  HENT:  Head: Normocephalic and atraumatic.  Right Ear: External ear normal.  Left Ear: External ear normal.  Nose: Nose normal.  Mouth/Throat: Oropharynx is clear and moist. No oropharyngeal exudate.  Eyes: Pupils are equal, round, and reactive to light. Conjunctivae and EOM are normal. Right eye exhibits no discharge. Left eye exhibits no discharge. No scleral icterus.  Neck: Neck supple. No JVD present. No tracheal deviation present.  No thyromegaly present.  Cardiovascular: Normal rate, regular rhythm, normal heart sounds and intact distal pulses. Exam reveals no gallop and no friction rub.  No murmur heard. Pulmonary/Chest: Effort normal and breath sounds normal. No respiratory distress. He has no wheezes. He has no rales. He exhibits no tenderness.  Abdominal: Soft. Bowel sounds are normal. He exhibits no distension and no mass. There is no tenderness. There is no rebound and no guarding.  Musculoskeletal: Normal range of motion. He exhibits no edema, tenderness or deformity.  Lymphadenopathy:    He has no cervical adenopathy.  Neurological: He is alert and oriented to person, place, and time. He has normal reflexes. He displays normal reflexes. No cranial nerve deficit. He exhibits normal muscle tone. Coordination normal.  Skin: Skin is warm. No rash noted. He is not diaphoretic. No erythema. No pallor.  Psychiatric: He has a normal mood and affect. His behavior is normal. Judgment and thought content normal.  Vitals reviewed.       Assessment & Plan:  General medical exam Physical exam today is excellent.  Blood work is outstanding.  PSA is normal.  Colonoscopy is up-to-date.  Patient had his flu shot at work.  He is due for a tetanus vaccine he can get this anytime he likes it.  His HIV test is due however the patient politely declines this.  I congratulated him on his weight loss.  Regular anticipatory guidance is provided

## 2018-11-15 ENCOUNTER — Other Ambulatory Visit: Payer: Self-pay | Admitting: Family Medicine

## 2018-11-28 ENCOUNTER — Telehealth: Payer: Self-pay

## 2018-11-28 NOTE — Telephone Encounter (Signed)
Patient's wife was calling to discuss botox injections for her husband. She stated she can be reached after 2 pm at [phone]

## 2018-11-29 NOTE — Telephone Encounter (Signed)
I called pt to discuss. No answer, left a message asking him to call me back.  It appears that pt is being followed by [facility] for OSA on CPAP. While we do note that he has a history of migraine headaches, it does not appear that we have been actively treating them. Pt will most likely need an appt to discuss his migraines and treatment options.

## 2018-11-29 NOTE — Telephone Encounter (Signed)
Brian Newton, I dont see anything about this patient receiving Botox, could you please advise me. Also, not sure what he would be getting the injections for.

## 2018-11-30 DIAGNOSIS — K080 Exfoliation of teeth due to systemic causes: Secondary | ICD-10-CM | POA: Diagnosis not present

## 2018-12-01 NOTE — Telephone Encounter (Signed)
I called pt to discuss his questions regarding botox. No answer, left a message asking him to call me back.

## 2018-12-04 NOTE — Telephone Encounter (Signed)
I called pt again to discuss. No answer, left a message asking him to call me back.  If pt or wife call back, please advise them that pt will need a referral in to [facility] regarding his migraines, and then we can schedule him with Dr. Rexene Alberts in a new patient appointment to discuss this new problem. We cannot start botox without extensive documentation regarding migraine history.

## 2018-12-04 NOTE — Telephone Encounter (Signed)
Pt wife returning RNs call advised of what had been noted

## 2018-12-21 DIAGNOSIS — Z09 Encounter for follow-up examination after completed treatment for conditions other than malignant neoplasm: Secondary | ICD-10-CM | POA: Diagnosis not present

## 2019-01-05 ENCOUNTER — Encounter: Payer: Self-pay | Admitting: Family Medicine

## 2019-01-05 ENCOUNTER — Ambulatory Visit
Admission: RE | Admit: 2019-01-05 | Discharge: 2019-01-05 | Disposition: A | Discharge status: Home / Self Care | Payer: Federal, State, Local not specified - PPO | Source: Ambulatory Visit | Attending: Family Medicine | Admitting: Family Medicine

## 2019-01-05 ENCOUNTER — Ambulatory Visit: Payer: Federal, State, Local not specified - PPO | Admitting: Family Medicine

## 2019-01-05 VITALS — BP 152/90 | HR 60 | Temp 97.8°F | Resp 18 | Ht 65.0 in | Wt 221.0 lb

## 2019-01-05 DIAGNOSIS — M25561 Pain in right knee: Secondary | ICD-10-CM | POA: Diagnosis not present

## 2019-01-05 DIAGNOSIS — M25512 Pain in left shoulder: Secondary | ICD-10-CM | POA: Diagnosis not present

## 2019-01-05 DIAGNOSIS — M1711 Unilateral primary osteoarthritis, right knee: Secondary | ICD-10-CM | POA: Diagnosis not present

## 2019-01-05 MED ORDER — DICLOFENAC SODIUM 75 MG PO TBEC: 75.0000 mg | DELAYED_RELEASE_TABLET | Freq: Two times a day (BID) | ORAL | 0 refills | Status: DC

## 2019-01-05 NOTE — Progress Notes (Signed)
Subjective:    Patient ID: Brian Newton, male    DOB: 1966/03/27, 53 y.o.   MRN: [id number]  HPI  Patient presents today with gradual onset of right knee pain.  Pain is located over the medial compartment.  It comes and goes.  It aches and throbs after prolonged standing or walking.  It hurts at times for him to drive and put pressure on the gas pedal.  Then at other times the knee does not hurt at all.  Today he is pain-free.  There is no erythema.  There is no effusion.  There is no warmth.  He has full range of motion however there is crepitus in the knee joint with full extension and flexion.  He has a negative Apley grind.  There is no laxity to varus or valgus stress although there is some mild tenderness to palpation over the medial compartment suggesting medial compartment arthritis.  He also ports 4 weeks of pain in his left shoulder.  The pain is only present when he begins to do internal rotation and turn his arm behind his back.  The pain is located along the anterior surface of the right shoulder near the insertion of the biceps tendon.  There is no pain with abduction.  He has full abduction to 180 degrees.  There is no pain with external rotation.  There is no pain with flexion or extension of the elbow.  He has a negative Hawkins test.  He has a negative empty can test.  He has a negative Spurling's maneuver.  Past Medical History:  Diagnosis Date  . Arthritis   . Borderline hyperglycemia   . Headache(784.0)   . Hypercholesteremia   . Hypertension   . Metabolic syndrome   . Shingles   . Sleep apnea    uses C PAP  . Swelling    both legs  . 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 Wrist pain    Current Outpatient Medications on File Prior to Visit  Medication Sig Dispense Refill  . amLODipine-benazepril (LOTREL) 5-20 MG capsule TAKE (2) CAPSULES BY MOUTH ONCE EVERY MORNING. 60 capsule 3  . clotrimazole-betamethasone (LOTRISONE) cream APPLY TO THE AFFECTED  AREA TOPICALLY TWICE DAILY. 45 g 0  . Cyanocobalamin 500 MCG/0.1ML SOLN Place 500 mcg into the nose once a week.    . ferrous sulfate 325 (65 FE) MG tablet Take 325 mg by mouth daily with breakfast.    . loratadine (CLARITIN) 10 MG tablet Take 10 mg by mouth every morning.    . Multiple Vitamins-Minerals (MULTIVITAMIN WITH MINERALS) tablet Take 1 tablet by mouth every morning.     Marland Kitchen OVER THE COUNTER MEDICATION Calcium - 600mg  - vitamin d 1500units - magnesium - 120mg  - 2 tablets bid    . OVER THE COUNTER MEDICATION Iron - 54mg  - vitamn c 90mg  - 1 qam - 2 pm     No current facility-administered medications on file prior to visit.    Allergies  Allergen Reactions  . Penicillins Rash   Social History   Socioeconomic History  . Marital status: Married    Spouse name: Not on file  . Number of children: 2  . Years of education: HS  . Highest education level: Not on file  Occupational History  . Not on file  Social Needs  . Financial resource strain: Not on file  . Food insecurity:    Worry: Not on file    Inability: Not on file  .  Transportation needs:    Medical: Not on file    Non-medical: Not on file  Tobacco Use  . Smoking status: Never Smoker  . Smokeless tobacco: Never Used  Substance and Sexual Activity  . Alcohol use: No    Alcohol/week: 0.0 standard drinks  . Drug use: No  . Sexual activity: Yes  Lifestyle  . Physical activity:    Days per week: Not on file    Minutes per session: Not on file  . Stress: Not on file  Relationships  . Social connections:    Talks on phone: Not on file    Gets together: Not on file    Attends religious service: Not on file    Active member of club or organization: Not on file    Attends meetings of clubs or organizations: Not on file    Relationship status: Not on file  . Intimate partner violence:    Fear of current or ex partner: Not on file    Emotionally abused: Not on file    Physically abused: Not on file    Forced  sexual activity: Not on file  Other Topics Concern  . Not on file  Social History Narrative   Denies caffeine use       Review of Systems  All other systems reviewed and are negative.      Objective:   Physical Exam  Constitutional: He appears well-developed and well-nourished. No distress.  Cardiovascular: Normal rate, regular rhythm and normal heart sounds. Exam reveals no gallop and no friction rub.  No murmur heard. Pulmonary/Chest: Effort normal and breath sounds normal. No respiratory distress. He has no wheezes. He has no rales.  Musculoskeletal:     Left shoulder: He exhibits normal range of motion, no tenderness, no bony tenderness, no swelling, no effusion, no crepitus, no pain, no spasm and normal strength.     Right knee: He exhibits normal range of motion, no effusion, no erythema, no LCL laxity, no bony tenderness, normal meniscus and no MCL laxity. Tenderness found. Medial joint line tenderness noted. No lateral joint line, no MCL and no LCL tenderness noted.       Arms:  Skin: He is not diaphoretic.  Vitals reviewed.       Assessment & Plan:  Acute pain of right knee - Plan: DG Knee Complete 4 Views Right  I believe the pain in his right knee likely represents medial compartment arthritis.  I recommended an x-ray of the right knee.  Meanwhile begin diclofenac 75 mg p.o. twice daily as needed knee pain.  I believe the pain in his left shoulder likely represents a muscle strain.  Pain is only present with internal rotation of the left shoulder and certain stretching motions.  I have recommended tincture of time and range of motion exercises to rehab the tendon that is likely irritated.  Most likely this represents a deltoid strain or pectoralis strain.

## 2019-01-16 ENCOUNTER — Encounter: Payer: Self-pay | Admitting: Family Medicine

## 2019-01-16 ENCOUNTER — Ambulatory Visit: Payer: Federal, State, Local not specified - PPO | Admitting: Family Medicine

## 2019-01-16 VITALS — BP 134/80 | HR 77 | Temp 98.6°F | Resp 15 | Ht 65.0 in | Wt 227.1 lb

## 2019-01-16 DIAGNOSIS — R05 Cough: Secondary | ICD-10-CM | POA: Diagnosis not present

## 2019-01-16 DIAGNOSIS — M791 Myalgia, unspecified site: Secondary | ICD-10-CM | POA: Diagnosis not present

## 2019-01-16 DIAGNOSIS — J069 Acute upper respiratory infection, unspecified: Secondary | ICD-10-CM | POA: Diagnosis not present

## 2019-01-16 DIAGNOSIS — R059 Cough, unspecified: Secondary | ICD-10-CM

## 2019-01-16 LAB — INFLUENZA A AND B AG, IMMUNOASSAY
INFLUENZA A ANTIGEN: NOT DETECTED
INFLUENZA B ANTIGEN: NOT DETECTED

## 2019-01-16 MED ORDER — PREDNISONE 20 MG PO TABS: 40.0000 mg | ORAL_TABLET | Freq: Every day | ORAL | 0 refills | Status: AC

## 2019-01-16 NOTE — Progress Notes (Signed)
Patient ID: Brian Newton, male    DOB: 06-07-1966, 53 y.o.   MRN: [id number]  PCP: Susy Frizzle, MD  Chief Complaint  Patient presents with  . Cough    Patient in with c/o cough, fever, muscle aches, onset sunday    Subjective:   Brian Newton is a 53 y.o. male, presents to clinic with CC of cough, fever Tmax 100.4, body aches, onset 2 days ago, cough dry, ST aggravating, some myalgias, wife same sx seen last week.  No HA, no sinus pain/pressure.   Sx unchanged since onset, fairly moderate to severe, no associated CP, near syncope, SOB, wheeze.  No hx of lung disease.       Patient Active Problem List   Diagnosis Date Noted  . Morbid obesity ([place]) 04/01/2014  . Hypercholesteremia   . Hypertension   . 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 Metabolic syndrome      Prior to Admission medications   Medication Sig Start Date End Date Taking? Authorizing Provider  amLODipine-benazepril (LOTREL) 5-20 MG capsule TAKE (2) CAPSULES BY MOUTH ONCE EVERY MORNING. 11/15/18  Yes Pickard, Cammie Mcgee, MD  clotrimazole-betamethasone (LOTRISONE) cream APPLY TO THE AFFECTED AREA TOPICALLY TWICE DAILY. 04/10/18  Yes Susy Frizzle, MD  Cyanocobalamin 500 MCG/0.1ML SOLN Place 500 mcg into the nose once a week.   Yes [provider]  diclofenac (VOLTAREN) 75 MG EC tablet Take 1 tablet (75 mg total) by mouth 2 (two) times daily. 01/05/19  Yes Susy Frizzle, MD  ferrous sulfate 325 (65 FE) MG tablet Take 325 mg by mouth daily with breakfast.   Yes [provider]  loratadine (CLARITIN) 10 MG tablet Take 10 mg by mouth every morning.   Yes [provider]  Multiple Vitamins-Minerals (MULTIVITAMIN WITH MINERALS) tablet Take 1 tablet by mouth every morning.    Yes [provider]  OVER THE COUNTER MEDICATION Calcium - 600mg  - vitamin d 1500units - magnesium - 120mg  - 2 tablets bid   Yes [provider]  OVER THE COUNTER  MEDICATION Iron - 54mg  - vitamn c 90mg  - 1 qam - 2 pm   Yes [provider]     Allergies  Allergen Reactions  . Penicillins Rash     Family History  Problem Relation Age of Onset  . Hypertension Mother   . Hypertension Other   . Hyperlipidemia Other   . Heart disease Other   . Obesity Other   . Sleep apnea Other   . Breast cancer Maternal Grandmother   . Colon cancer Maternal Grandfather      Social History   Socioeconomic History  . Marital status: Married    Spouse name: Not on file  . Number of children: 2  . Years of education: HS  . Highest education level: Not on file  Occupational History  . Not on file  Social Needs  . Financial resource strain: Not on file  . Food insecurity:    Worry: Not on file    Inability: Not on file  . Transportation needs:    Medical: Not on file    Non-medical: Not on file  Tobacco Use  . Smoking status: Never Smoker  . Smokeless tobacco: Never Used  Substance and Sexual Activity  . Alcohol use: No    Alcohol/week: 0.0 standard drinks  . Drug use: No  . Sexual activity: Yes  Lifestyle  . Physical activity:    Days per week:  Not on file    Minutes per session: Not on file  . Stress: Not on file  Relationships  . Social connections:    Talks on phone: Not on file    Gets together: Not on file    Attends religious service: Not on file    Active member of club or organization: Not on file    Attends meetings of clubs or organizations: Not on file    Relationship status: Not on file  . Intimate partner violence:    Fear of current or ex partner: Not on file    Emotionally abused: Not on file    Physically abused: Not on file    Forced sexual activity: Not on file  Other Topics Concern  . Not on file  Social History Narrative   Denies caffeine use      Review of Systems  Constitutional: Negative.   HENT: Negative.   Eyes: Negative.   Respiratory: Negative.   Cardiovascular: Negative.     Gastrointestinal: Negative.   Endocrine: Negative.   Genitourinary: Negative.   Musculoskeletal: Negative.   Skin: Negative.   Allergic/Immunologic: Negative.   Neurological: Negative.   Hematological: Negative.   Psychiatric/Behavioral: Negative.   All other systems reviewed and are negative.      Objective:    Vitals:   01/16/19 0813  BP: 134/80  Pulse: 77  Resp: 15  Temp: 98.6 F (37 C)  TempSrc: Oral  SpO2: 97%  Weight: 227 lb 2 oz (103 kg)  Height: 5\' 5"  (1.651 m)      Physical Exam Vitals signs and nursing note reviewed.  Constitutional:      General: He is not in acute distress.    Appearance: Normal appearance. He is well-developed. He is not ill-appearing, toxic-appearing or diaphoretic.  HENT:     Head: Normocephalic and atraumatic.     Jaw: No trismus.     Right Ear: Tympanic membrane, ear canal and external ear normal.     Left Ear: Tympanic membrane, ear canal and external ear normal.     Nose: Mucosal edema, congestion and rhinorrhea present.     Right Sinus: No maxillary sinus tenderness or frontal sinus tenderness.     Left Sinus: No maxillary sinus tenderness or frontal sinus tenderness.     Mouth/Throat:     Mouth: Mucous membranes are moist. Mucous membranes are not pale, not dry and not cyanotic.     Pharynx: Uvula midline. Posterior oropharyngeal erythema present. No oropharyngeal exudate or uvula swelling.     Tonsils: No tonsillar exudate or tonsillar abscesses.  Eyes:     General: Lids are normal.        Right eye: No discharge.        Left eye: No discharge.     Conjunctiva/sclera: Conjunctivae normal.     Pupils: Pupils are equal, round, and reactive to light.  Neck:     Musculoskeletal: Normal range of motion and neck supple.     Trachea: Trachea and phonation normal. No tracheal deviation.  Cardiovascular:     Rate and Rhythm: Normal rate and regular rhythm.     Pulses: Normal pulses.          Radial pulses are 2+ on the right  side and 2+ on the left side.     Heart sounds: Normal heart sounds. No murmur. No friction rub. No gallop.   Pulmonary:     Effort: Pulmonary effort is normal. No tachypnea, accessory muscle usage  or respiratory distress.     Breath sounds: Normal breath sounds. No stridor. No decreased breath sounds, wheezing, rhonchi or rales.  Abdominal:     General: Bowel sounds are normal. There is no distension.     Palpations: Abdomen is soft.     Tenderness: There is no abdominal tenderness. There is no guarding.  Musculoskeletal: Normal range of motion.  Lymphadenopathy:     Cervical: No cervical adenopathy.  Skin:    General: Skin is warm and dry.     Capillary Refill: Capillary refill takes less than 2 seconds.     Coloration: Skin is not pale.     Findings: No rash.     Nails: There is no clubbing.   Neurological:     Mental Status: He is alert and oriented to person, place, and time.     Motor: No abnormal muscle tone.     Coordination: Coordination normal.     Gait: Gait normal.  Psychiatric:        Speech: Speech normal.        Behavior: Behavior normal. Behavior is cooperative.           Assessment & Plan:   Pt with URI and cough sx x 2 days, same as wife, seen last week. Flu negative Viral illness, possibly something like parainfluenza, tx supportive and sx, no abx currently indicated, steroids for cough to use only if he develops wheeze/bronchitis.  Otherwise supportive - self limiting, follow up if not improving in expected time, follow up with any acute worsening.    ICD-10-CM   1. Upper respiratory tract infection, unspecified type J06.9   2. Muscle ache M79.10 Influenza A and B Ag, Immunoassay  3. Cough R05 Influenza A and B Ag, Immunoassay       Delsa Grana, PA-C 01/16/19 8:21 AM

## 2019-01-24 ENCOUNTER — Encounter: Payer: Self-pay | Admitting: Family Medicine

## 2019-01-24 ENCOUNTER — Telehealth: Payer: Self-pay | Admitting: Family Medicine

## 2019-01-24 ENCOUNTER — Ambulatory Visit: Payer: Federal, State, Local not specified - PPO | Admitting: Family Medicine

## 2019-01-24 VITALS — BP 108/62 | HR 83 | Temp 98.2°F | Resp 16 | Ht 65.0 in | Wt 225.0 lb

## 2019-01-24 DIAGNOSIS — J069 Acute upper respiratory infection, unspecified: Secondary | ICD-10-CM

## 2019-01-24 DIAGNOSIS — B9689 Other specified bacterial agents as the cause of diseases classified elsewhere: Secondary | ICD-10-CM | POA: Diagnosis not present

## 2019-01-24 DIAGNOSIS — J019 Acute sinusitis, unspecified: Secondary | ICD-10-CM

## 2019-01-24 MED ORDER — DOXYCYCLINE HYCLATE 100 MG PO TABS: 100.0000 mg | ORAL_TABLET | Freq: Two times a day (BID) | ORAL | 0 refills | Status: AC

## 2019-01-24 NOTE — Progress Notes (Signed)
Patient ID: ABDULKAREEM BADOLATO, male    DOB: 11-Aug-1966, 53 y.o.   MRN: [id number]  PCP: Susy Frizzle, MD  Chief Complaint  Patient presents with  . Sinusitis    Patient in today with c/o sinus pressure, yellow nasal drainiage with blood. Onset 1 week ago.    Subjective:   AVANT PRINTY is a 53 y.o. male, presents to clinic with CC of worsening URI sx - Severe allergy sinus this am He had URI for 1-2 weeks, seen here by me about 8 days ago, he had worsening cough so he filled and took the prednisone.  Cough got better, over the weekend he had scratchy throat and sinuses better, this am facial pain and sinus pain and pressure acutely worsened - pain rated 6/10 constant, he took OTC meds with tylenol, pain is now a 1/10, he feels fullness and pressure. He did start taking OTC meds with guaifenesin and decongestants. He denies fever, rash, sweats, chills, neck pain, nausea, vomiting, shortness of breath   Patient Active Problem List   Diagnosis Date Noted  . Morbid obesity ([place]) 04/01/2014  . Hypercholesteremia   . Hypertension   . 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 Metabolic syndrome      Prior to Admission medications   Medication Sig Start Date End Date Taking? Authorizing Provider  amLODipine-benazepril (LOTREL) 5-20 MG capsule TAKE (2) CAPSULES BY MOUTH ONCE EVERY MORNING. 11/15/18  Yes Pickard, Cammie Mcgee, MD  clotrimazole-betamethasone (LOTRISONE) cream APPLY TO THE AFFECTED AREA TOPICALLY TWICE DAILY. 04/10/18  Yes Susy Frizzle, MD  Cyanocobalamin 500 MCG/0.1ML SOLN Place 500 mcg into the nose once a week.   Yes [provider]  ferrous sulfate 325 (65 FE) MG tablet Take 325 mg by mouth daily with breakfast.   Yes [provider]  loratadine (CLARITIN) 10 MG tablet Take 10 mg by mouth every morning.   Yes [provider]  Multiple Vitamins-Minerals (MULTIVITAMIN WITH MINERALS) tablet Take 1 tablet by mouth every  morning.    Yes [provider]  OVER THE COUNTER MEDICATION Calcium - 600mg  - vitamin d 1500units - magnesium - 120mg  - 2 tablets bid   Yes [provider]  OVER THE COUNTER MEDICATION Iron - 54mg  - vitamn c 90mg  - 1 qam - 2 pm   Yes [provider]  diclofenac (VOLTAREN) 75 MG EC tablet Take 1 tablet (75 mg total) by mouth 2 (two) times daily. Patient not taking: Reported on 01/24/2019 01/05/19   Susy Frizzle, MD     Allergies  Allergen Reactions  . Penicillins Rash     Family History  Problem Relation Age of Onset  . Hypertension Mother   . Hypertension Other   . Hyperlipidemia Other   . Heart disease Other   . Obesity Other   . Sleep apnea Other   . Breast cancer Maternal Grandmother   . Colon cancer Maternal Grandfather      Social History   Socioeconomic History  . Marital status: Married    Spouse name: Not on file  . Number of children: 2  . Years of education: HS  . Highest education level: Not on file  Occupational History  . Not on file  Social Needs  . Financial resource strain: Not on file  . Food insecurity:    Worry: Not on file    Inability: Not on file  . Transportation needs:    Medical: Not on file  Non-medical: Not on file  Tobacco Use  . Smoking status: Never Smoker  . Smokeless tobacco: Never Used  Substance and Sexual Activity  . Alcohol use: No    Alcohol/week: 0.0 standard drinks  . Drug use: No  . Sexual activity: Yes  Lifestyle  . Physical activity:    Days per week: Not on file    Minutes per session: Not on file  . Stress: Not on file  Relationships  . Social connections:    Talks on phone: Not on file    Gets together: Not on file    Attends religious service: Not on file    Active member of club or organization: Not on file    Attends meetings of clubs or organizations: Not on file    Relationship status: Not on file  . Intimate partner violence:    Fear of current or ex partner: Not on  file    Emotionally abused: Not on file    Physically abused: Not on file    Forced sexual activity: Not on file  Other Topics Concern  . Not on file  Social History Narrative   Denies caffeine use      Review of Systems  Constitutional: Negative.   HENT: Negative.   Eyes: Negative.   Respiratory: Negative.   Cardiovascular: Negative.   Gastrointestinal: Negative.   Endocrine: Negative.   Genitourinary: Negative.   Musculoskeletal: Negative.   Skin: Negative.   Allergic/Immunologic: Negative.   Neurological: Negative.   Hematological: Negative.   Psychiatric/Behavioral: Negative.   All other systems reviewed and are negative.      Objective:    Vitals:   01/24/19 1207  BP: 108/62  Pulse: 83  Resp: 16  Temp: 98.2 F (36.8 C)  TempSrc: Oral  SpO2: 96%  Weight: 225 lb (102.1 kg)  Height: 5\' 5"  (1.651 m)      Physical Exam Vitals signs and nursing note reviewed.  Constitutional:      General: He is not in acute distress.    Appearance: Normal appearance. He is well-developed. He is not ill-appearing, toxic-appearing or diaphoretic.  HENT:     Head: Normocephalic and atraumatic.     Jaw: No trismus.     Right Ear: Tympanic membrane, ear canal and external ear normal.     Left Ear: Tympanic membrane, ear canal and external ear normal.     Nose: Mucosal edema, congestion and rhinorrhea present.     Right Sinus: No maxillary sinus tenderness or frontal sinus tenderness.     Left Sinus: No maxillary sinus tenderness or frontal sinus tenderness.     Mouth/Throat:     Mouth: Mucous membranes are not pale, not dry and not cyanotic.     Pharynx: Uvula midline. Posterior oropharyngeal erythema present. No oropharyngeal exudate or uvula swelling.     Tonsils: No tonsillar exudate or tonsillar abscesses.  Eyes:     General: Lids are normal.        Right eye: No discharge.        Left eye: No discharge.     Conjunctiva/sclera: Conjunctivae normal.     Pupils:  Pupils are equal, round, and reactive to light.  Neck:     Musculoskeletal: Normal range of motion and neck supple.     Trachea: Trachea and phonation normal. No tracheal deviation.  Cardiovascular:     Rate and Rhythm: Normal rate and regular rhythm.     Pulses: Normal pulses.  Radial pulses are 2+ on the right side and 2+ on the left side.     Heart sounds: Normal heart sounds. No murmur. No friction rub. No gallop.   Pulmonary:     Effort: Pulmonary effort is normal. No tachypnea, accessory muscle usage or respiratory distress.     Breath sounds: Normal breath sounds. No stridor. No decreased breath sounds, wheezing, rhonchi or rales.  Abdominal:     General: Bowel sounds are normal. There is no distension.     Palpations: Abdomen is soft.     Tenderness: There is no abdominal tenderness.  Musculoskeletal: Normal range of motion.  Skin:    General: Skin is warm and dry.     Capillary Refill: Capillary refill takes less than 2 seconds.     Coloration: Skin is not pale.     Findings: No rash.     Nails: There is no clubbing.   Neurological:     Mental Status: He is alert and oriented to person, place, and time.     Motor: No abnormal muscle tone.     Coordination: Coordination normal.     Gait: Gait normal.  Psychiatric:        Speech: Speech normal.        Behavior: Behavior normal. Behavior is cooperative.           Assessment & Plan:      ICD-10-CM   1. Upper respiratory tract infection, unspecified type J06.9   2. Acute bacterial sinusitis J01.90    B96.89     Patient with continued URI symptoms and sinusitis, pain acutely worsened in the last day, symptoms ongoing for more than 8 days, the pain and pressure in his sinuses was rated 6 out of 10 upon waking and 1 out of 10 after using over-the-counter medications.  His physical exam is still fairly consistent with viral URI and his sinus tenderness to palpation is very minimal.  Since he had such improvement  today with over-the-counter medications, patient was encouraged to continue to use them, use steroid nasal spray, antihistamines, decongestants, Tylenol and ibuprofen and he may improve in the next couple days.  If he continues to worsen I did prescribe for him doxycycline which would be indicated for treatment of acute bacterial sinusitis.  More than 8 days of sx, worsening, he could start abx today with indication for ABS, but I think he has chance of improving, so asked him to wait another day or two.  He agrees to plan  Delsa Grana, PA-C 01/24/19 12:28 PM

## 2019-01-24 NOTE — Telephone Encounter (Signed)
Sorry, no.  I do not call in antibiotics over the phone and pt will need to come in to be reevaluated.  Viral illnesses can last several weeks.  He would need to be rechecked to see if he developed any bacterial infections that require antibiotics.

## 2019-01-24 NOTE — Telephone Encounter (Signed)
[facility]  Patients wife tracy calling to ask if there is any chance he can get an antibiotic called in for his sinus issues and he also is having popping in his ears  [phone]

## 2019-01-24 NOTE — Telephone Encounter (Signed)
Spoke with patient and patient scheduled an appointment for today.

## 2019-01-24 NOTE — Patient Instructions (Signed)
Do steroid nasal spray daily - flonase or nasonex, 2 sprays each nostril once daily   Sinusitis, Adult Sinusitis is soreness and swelling (inflammation) of your sinuses. Sinuses are hollow spaces in the bones around your face. They are located:  Around your eyes.  In the middle of your forehead.  Behind your nose.  In your cheekbones. Your sinuses and nasal passages are lined with a fluid called mucus. Mucus drains out of your sinuses. Swelling can trap mucus in your sinuses. This lets germs (bacteria, virus, or fungus) grow, which leads to infection. Most of the time, this condition is caused by a virus. What are the causes? This condition is caused by:  Allergies.  Asthma.  Germs.  Things that block your nose or sinuses.  Growths in the nose (nasal polyps).  Chemicals or irritants in the air.  Fungus (rare). What increases the risk? You are more likely to develop this condition if:  You have a weak body defense system (immune system).  You do a lot of swimming or diving.  You use nasal sprays too much.  You smoke. What are the signs or symptoms? The main symptoms of this condition are pain and a feeling of pressure around the sinuses. Other symptoms include:  Stuffy nose (congestion).  Runny nose (drainage).  Swelling and warmth in the sinuses.  Headache.  Toothache.  A cough that may get worse at night.  Mucus that collects in the throat or the back of the nose (postnasal drip).  Being unable to smell and taste.  Being very tired (fatigue).  A fever.  Sore throat.  Bad breath. How is this diagnosed? This condition is diagnosed based on:  Your symptoms.  Your medical history.  A physical exam.  Tests to find out if your condition is short-term (acute) or long-term (chronic). Your doctor may: ? Check your nose for growths (polyps). ? Check your sinuses using a tool that has a light (endoscope). ? Check for allergies or germs. ? Do  imaging tests, such as an MRI or CT scan. How is this treated? Treatment for this condition depends on the cause and whether it is short-term or long-term.  If caused by a virus, your symptoms should go away on their own within 10 days. You may be given medicines to relieve symptoms. They include: ? Medicines that shrink swollen tissue in the nose. ? Medicines that treat allergies (antihistamines). ? A spray that treats swelling of the nostrils. ? Rinses that help get rid of thick mucus in your nose (nasal saline washes).  If caused by bacteria, your doctor may wait to see if you will get better without treatment. You may be given antibiotic medicine if you have: ? A very bad infection. ? A weak body defense system.  If caused by growths in the nose, you may need to have surgery. Follow these instructions at home: Medicines  Take, use, or apply over-the-counter and prescription medicines only as told by your doctor. These may include nasal sprays.  If you were prescribed an antibiotic medicine, take it as told by your doctor. Do not stop taking the antibiotic even if you start to feel better. Hydrate and humidify   Drink enough water to keep your pee (urine) pale yellow.  Use a cool mist humidifier to keep the humidity level in your home above 50%.  Breathe in steam for 10-15 minutes, 3-4 times a day, or as told by your doctor. You can do this in the bathroom  while a hot shower is running.  Try not to spend time in cool or dry air. Rest  Rest as much as you can.  Sleep with your head raised (elevated).  Make sure you get enough sleep each night. General instructions   Put a warm, moist washcloth on your face 3-4 times a day, or as often as told by your doctor. This will help with discomfort.  Wash your hands often with soap and water. If there is no soap and water, use hand sanitizer.  Do not smoke. Avoid being around people who are smoking (secondhand smoke).  Keep  all follow-up visits as told by your doctor. This is important. Contact a doctor if:  You have a fever.  Your symptoms get worse.  Your symptoms do not get better within 10 days. Get help right away if:  You have a very bad headache.  You cannot stop throwing up (vomiting).  You have very bad pain or swelling around your face or eyes.  You have trouble seeing.  You feel confused.  Your neck is stiff.  You have trouble breathing. Summary  Sinusitis is swelling of your sinuses. Sinuses are hollow spaces in the bones around your face.  This condition is caused by tissues in your nose that become inflamed or swollen. This traps germs. These can lead to infection.  If you were prescribed an antibiotic medicine, take it as told by your doctor. Do not stop taking it even if you start to feel better.  Keep all follow-up visits as told by your doctor. This is important. This information is not intended to replace advice given to you by your health care provider. Make sure you discuss any questions you have with your health care provider. Document Released: 05/24/2008 Document Revised: 05/08/2018 Document Reviewed: 05/08/2018 Elsevier Interactive Patient Education  2019 [employer].

## 2019-02-09 ENCOUNTER — Ambulatory Visit: Payer: Federal, State, Local not specified - PPO | Admitting: Family Medicine

## 2019-02-09 ENCOUNTER — Encounter: Payer: Self-pay | Admitting: Family Medicine

## 2019-02-09 VITALS — BP 140/80 | HR 84 | Temp 97.4°F | Resp 16 | Ht 65.0 in | Wt 234.0 lb

## 2019-02-09 DIAGNOSIS — M79622 Pain in left upper arm: Secondary | ICD-10-CM

## 2019-02-09 DIAGNOSIS — M791 Myalgia, unspecified site: Secondary | ICD-10-CM

## 2019-02-09 MED ORDER — CYCLOBENZAPRINE HCL 10 MG PO TABS: 10.0000 mg | ORAL_TABLET | Freq: Three times a day (TID) | ORAL | 0 refills | Status: DC | PRN

## 2019-02-09 NOTE — Progress Notes (Signed)
Subjective:    Patient ID: Brian Newton, male    DOB: 23-Aug-1966, 52 y.o.   MRN: [id number]  HPI  01/05/19 Patient presents today with gradual onset of right knee pain.  Pain is located over the medial compartment.  It comes and goes.  It aches and throbs after prolonged standing or walking.  It hurts at times for him to drive and put pressure on the gas pedal.  Then at other times the knee does not hurt at all.  Today he is pain-free.  There is no erythema.  There is no effusion.  There is no warmth.  He has full range of motion however there is crepitus in the knee joint with full extension and flexion.  He has a negative Apley grind.  There is no laxity to varus or valgus stress although there is some mild tenderness to palpation over the medial compartment suggesting medial compartment arthritis.  He also ports 4 weeks of pain in his left shoulder.  The pain is only present when he begins to do internal rotation and turn his arm behind his back.  The pain is located along the anterior surface of the right shoulder near the insertion of the biceps tendon.  There is no pain with abduction.  He has full abduction to 180 degrees.  There is no pain with external rotation.  There is no pain with flexion or extension of the elbow.  He has a negative Hawkins test.  He has a negative empty can test.  He has a negative Spurling's maneuver.  AT that time, my plan was: I believe the pain in his right knee likely represents medial compartment arthritis.  I recommended an x-ray of the right knee.  Meanwhile begin diclofenac 75 mg p.o. twice daily as needed knee pain.  I believe the pain in his left shoulder likely represents a muscle strain.  Pain is only present with internal rotation of the left shoulder and certain stretching motions.  I have recommended tincture of time and range of motion exercises to rehab the tendon that is likely irritated.  Most likely this represents a deltoid strain or pectoralis  strain.  02/09/19 Patient continues to report pain and stiffness in his left shoulder.  However when he points to his left shoulder he is actually pointing to the anterior and medial surface of his left bicep.  He states that with internal rotation of the left shoulder he develops a pulling stabbing pain in the bicep/medial bicep.  He has no pain with abduction.  He is able to abduct his arm pain-free.  Is no pain with external rotation today.  He has a negative Spurling's maneuver.  He has a negative Jurgenson's and speeds test.  He has a negative empty can sign.  He has a negative Hawkins maneuver.  I am unable to appreciate or palpate any masses in his left bicep.  I am unable to reproduce the pain in his left bicep.  Resisted internal rotation however elicits the pain.  Resisted elbow flexion elicits and reproduces the pain  Past Medical History:  Diagnosis Date  . Arthritis   . Borderline hyperglycemia   . Headache(784.0)   . Hypercholesteremia   . Hypertension   . Metabolic syndrome   . Shingles   . Sleep apnea    uses C PAP  . Swelling    both legs  . 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 Wrist pain    Current Outpatient Medications  on File Prior to Visit  Medication Sig Dispense Refill  . amLODipine-benazepril (LOTREL) 5-20 MG capsule TAKE (2) CAPSULES BY MOUTH ONCE EVERY MORNING. 60 capsule 3  . clotrimazole-betamethasone (LOTRISONE) cream APPLY TO THE AFFECTED AREA TOPICALLY TWICE DAILY. 45 g 0  . Cyanocobalamin 500 MCG/0.1ML SOLN Place 500 mcg into the nose once a week.    . diclofenac (VOLTAREN) 75 MG EC tablet Take 1 tablet (75 mg total) by mouth 2 (two) times daily. (Patient taking differently: Take 75 mg by mouth 2 (two) times daily. PRN knee pain) 30 tablet 0  . ferrous sulfate 325 (65 FE) MG tablet Take 325 mg by mouth daily with breakfast.    . loratadine (CLARITIN) 10 MG tablet Take 10 mg by mouth every morning.    . Multiple Vitamins-Minerals  (MULTIVITAMIN WITH MINERALS) tablet Take 1 tablet by mouth every morning.     Marland Kitchen OVER THE COUNTER MEDICATION Calcium - 600mg  - vitamin d 1500units - magnesium - 120mg  - 2 tablets bid    . OVER THE COUNTER MEDICATION Iron - 54mg  - vitamn c 90mg  - 1 qam - 2 pm     No current facility-administered medications on file prior to visit.    Allergies  Allergen Reactions  . Penicillins Rash   Social History   Socioeconomic History  . Marital status: Married    Spouse name: Not on file  . Number of children: 2  . Years of education: HS  . Highest education level: Not on file  Occupational History  . Not on file  Social Needs  . Financial resource strain: Not on file  . Food insecurity:    Worry: Not on file    Inability: Not on file  . Transportation needs:    Medical: Not on file    Non-medical: Not on file  Tobacco Use  . Smoking status: Never Smoker  . Smokeless tobacco: Never Used  Substance and Sexual Activity  . Alcohol use: No    Alcohol/week: 0.0 standard drinks  . Drug use: No  . Sexual activity: Yes  Lifestyle  . Physical activity:    Days per week: Not on file    Minutes per session: Not on file  . Stress: Not on file  Relationships  . Social connections:    Talks on phone: Not on file    Gets together: Not on file    Attends religious service: Not on file    Active member of club or organization: Not on file    Attends meetings of clubs or organizations: Not on file    Relationship status: Not on file  . Intimate partner violence:    Fear of current or ex partner: Not on file    Emotionally abused: Not on file    Physically abused: Not on file    Forced sexual activity: Not on file  Other Topics Concern  . Not on file  Social History Narrative   Denies caffeine use       Review of Systems  All other systems reviewed and are negative.      Objective:   Physical Exam  Constitutional: He appears well-developed and well-nourished. No distress.    Cardiovascular: Normal rate, regular rhythm and normal heart sounds. Exam reveals no gallop and no friction rub.  No murmur heard. Pulmonary/Chest: Effort normal and breath sounds normal. No respiratory distress. He has no wheezes. He has no rales.  Musculoskeletal:     Left shoulder: He exhibits normal  range of motion, no tenderness, no bony tenderness, no swelling, no effusion, no crepitus, no pain, no spasm and normal strength.     Right knee: He exhibits normal range of motion, no effusion, no erythema, no LCL laxity, no bony tenderness, normal meniscus and no MCL laxity. Tenderness found. Medial joint line tenderness noted. No lateral joint line, no MCL and no LCL tenderness noted.       Arms:  Skin: He is not diaphoretic.  Vitals reviewed.       Assessment & Plan:  Myalgia - Plan: COMPLETE METABOLIC PANEL WITH GFR, Magnesium, CK  Left upper arm pain - Plan: DG Humerus Left  I am not certain of why the patient continues to have pain in his left arm.  At the present time a believe this is most likely a strain in 1 of the muscles around his shoulder perhaps the biceps.  Given the fact he is also complaining of muscle pain in his neck and lower back I will check baseline lab work including a CMP and magnesium level to evaluate for any electrolyte disturbances that could cause muscle spasms and muscle cramps.  Also check a CK level to evaluate for any evidence of myositis.  I will consult physical therapy as I believe this is most likely a muscle strain and the patient can use Flexeril 10 mg every 8 hours as needed for muscle pain.  I will obtain an x-ray of the humerus to evaluate for any bony lesions deep within the shoulder or upper humerus that could elicit aching pain in his arm.  If pain persist, we will need an orthopedics consultation or MRI

## 2019-02-10 LAB — COMPLETE METABOLIC PANEL WITH GFR
AG Ratio: 1.7 (calc) (ref 1.0–2.5)
ALT: 24 U/L (ref 9–46)
AST: 22 U/L (ref 10–35)
Albumin: 4 g/dL (ref 3.6–5.1)
Alkaline phosphatase (APISO): 86 U/L (ref 35–144)
BILIRUBIN TOTAL: 0.4 mg/dL (ref 0.2–1.2)
BUN: 21 mg/dL (ref 7–25)
CO2: 25 mmol/L (ref 20–32)
Calcium: 9.2 mg/dL (ref 8.6–10.3)
Chloride: 107 mmol/L (ref 98–110)
Creat: 0.96 mg/dL (ref 0.70–1.33)
GFR, Est African American: 103 mL/min/{1.73_m2} (ref >=60)
GFR, Est Non African American: 89 mL/min/{1.73_m2} (ref >=60)
Globulin: 2.3 g/dL (calc) (ref 1.9–3.7)
Glucose, Bld: 76 mg/dL (ref 65–99)
Potassium: 4.2 mmol/L (ref 3.5–5.3)
SODIUM: 140 mmol/L (ref 135–146)
Total Protein: 6.3 g/dL (ref 6.1–8.1)

## 2019-02-10 LAB — CK: Total CK: 91 U/L (ref 44–196)

## 2019-02-10 LAB — MAGNESIUM: Magnesium: 2.2 mg/dL (ref 1.5–2.5)

## 2019-02-13 ENCOUNTER — Ambulatory Visit
Admission: RE | Admit: 2019-02-13 | Discharge: 2019-02-13 | Disposition: A | Discharge status: Home / Self Care | Payer: Federal, State, Local not specified - PPO | Source: Ambulatory Visit | Attending: Family Medicine | Admitting: Family Medicine

## 2019-02-13 DIAGNOSIS — M19012 Primary osteoarthritis, left shoulder: Secondary | ICD-10-CM | POA: Diagnosis not present

## 2019-02-13 DIAGNOSIS — M79622 Pain in left upper arm: Secondary | ICD-10-CM

## 2019-03-06 ENCOUNTER — Other Ambulatory Visit: Payer: Self-pay | Admitting: Family Medicine

## 2019-03-06 NOTE — Telephone Encounter (Signed)
Ok to refill 

## 2019-03-08 ENCOUNTER — Other Ambulatory Visit: Payer: Self-pay | Admitting: Family Medicine

## 2019-03-08 NOTE — Telephone Encounter (Signed)
Last OV: 01/05/2019 Last Refill: 01/05/2019

## 2019-04-13 ENCOUNTER — Other Ambulatory Visit: Payer: Self-pay | Admitting: *Deleted

## 2019-04-13 MED ORDER — AMLODIPINE BESY-BENAZEPRIL HCL 5-20 MG PO CAPS: ORAL_CAPSULE | ORAL | 3 refills | Status: DC

## 2019-06-07 ENCOUNTER — Other Ambulatory Visit: Payer: Self-pay | Admitting: Family Medicine

## 2019-06-14 ENCOUNTER — Other Ambulatory Visit: Payer: Self-pay | Admitting: Family Medicine

## 2019-06-14 DIAGNOSIS — K080 Exfoliation of teeth due to systemic causes: Secondary | ICD-10-CM | POA: Diagnosis not present

## 2019-06-15 NOTE — Telephone Encounter (Signed)
Ok to refill 

## 2019-06-28 ENCOUNTER — Encounter: Payer: Self-pay | Admitting: Family Medicine

## 2019-06-28 ENCOUNTER — Other Ambulatory Visit: Payer: Self-pay

## 2019-06-28 ENCOUNTER — Ambulatory Visit (INDEPENDENT_AMBULATORY_CARE_PROVIDER_SITE_OTHER): Payer: Federal, State, Local not specified - PPO | Admitting: Family Medicine

## 2019-06-28 VITALS — BP 130/80 | HR 70 | Temp 97.9°F | Resp 16 | Ht 65.0 in | Wt 233.0 lb

## 2019-06-28 DIAGNOSIS — M25561 Pain in right knee: Secondary | ICD-10-CM

## 2019-06-28 DIAGNOSIS — M79622 Pain in left upper arm: Secondary | ICD-10-CM | POA: Diagnosis not present

## 2019-06-28 NOTE — Progress Notes (Signed)
Subjective:    Patient ID: Brian Newton, male    DOB: 07-09-1966, 53 y.o.   MRN: [id number]  HPI  01/05/19 Patient presents today with gradual onset of right knee pain.  Pain is located over the medial compartment.  It comes and goes.  It aches and throbs after prolonged standing or walking.  It hurts at times for him to drive and put pressure on the gas pedal.  Then at other times the knee does not hurt at all.  Today he is pain-free.  There is no erythema.  There is no effusion.  There is no warmth.  He has full range of motion however there is crepitus in the knee joint with full extension and flexion.  He has a negative Apley grind.  There is no laxity to varus or valgus stress although there is some mild tenderness to palpation over the medial compartment suggesting medial compartment arthritis.  He also ports 4 weeks of pain in his left shoulder.  The pain is only present when he begins to do internal rotation and turn his arm behind his back.  The pain is located along the anterior surface of the right shoulder near the insertion of the biceps tendon.  There is no pain with abduction.  He has full abduction to 180 degrees.  There is no pain with external rotation.  There is no pain with flexion or extension of the elbow.  He has a negative Hawkins test.  He has a negative empty can test.  He has a negative Spurling's maneuver.  AT that time, my plan was: I believe the pain in his right knee likely represents medial compartment arthritis.  I recommended an x-ray of the right knee.  Meanwhile begin diclofenac 75 mg p.o. twice daily as needed knee pain.  I believe the pain in his left shoulder likely represents a muscle strain.  Pain is only present with internal rotation of the left shoulder and certain stretching motions.  I have recommended tincture of time and range of motion exercises to rehab the tendon that is likely irritated.  Most likely this represents a deltoid strain or pectoralis  strain.  02/09/19 Patient continues to report pain and stiffness in his left shoulder.  However when he points to his left shoulder he is actually pointing to the anterior and medial surface of his left bicep.  He states that with internal rotation of the left shoulder he develops a pulling stabbing pain in the bicep/medial bicep.  He has no pain with abduction.  He is able to abduct his arm pain-free.  Is no pain with external rotation today.  He has a negative Spurling's maneuver.  He has a negative Jurgenson's and speeds test.  He has a negative empty can sign.  He has a negative Hawkins maneuver.  I am unable to appreciate or palpate any masses in his left bicep.  I am unable to reproduce the pain in his left bicep.  Resisted internal rotation however elicits the pain.  Resisted elbow flexion elicits and reproduces the pain.  At that time, my plan was: I am not certain of why the patient continues to have pain in his left arm.  At the present time a believe this is most likely a strain in 1 of the muscles around his shoulder perhaps the biceps.  Given the fact he is also complaining of muscle pain in his neck and lower back I will check baseline lab work including a  CMP and magnesium level to evaluate for any electrolyte disturbances that could cause muscle spasms and muscle cramps.  Also check a CK level to evaluate for any evidence of myositis.  I will consult physical therapy as I believe this is most likely a muscle strain and the patient can use Flexeril 10 mg every 8 hours as needed for muscle pain.  I will obtain an x-ray of the humerus to evaluate for any bony lesions deep within the shoulder or upper humerus that could elicit aching pain in his arm.  If pain persist, we will need an orthopedics consultation or MRI  06/28/19 Labs were normal.  Patient never saw physical therapy due to the COVID-19 pandemic.  Therefore nothing is really changed over the last 5 months.  He presents today continuing to  complain of pain in his left shoulder.  The pain seems to be localized to the proximal insertion of the biceps tendon near the left shoulder.  He reports pain with carrying objects or lifting objects.  However he has no pain with abduction of his left shoulder.  He has full internal and external rotation although he does report a pulling pain radiating down the body of the biceps muscle.  There seems to be a palpable defect in the proximal portion of the biceps near its insertion at the shoulder.  He also continues to have pain in his right knee.  The pain is more of a gradual constant dull pain.  It worsens throughout the day with prolonged standing or walking.  It hurts to rise from a seated position.  X-ray confirmed arthritis.  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 60 capsule 3   clotrimazole-betamethasone (LOTRISONE) cream APPLY TO THE AFFECTED AREA TOPICALLY TWICE DAILY. 45 g 0   Cyanocobalamin 500 MCG/0.1ML SOLN Place 500 mcg into the nose once a week.     cyclobenzaprine (FLEXERIL) 10 MG tablet TAKE 1 TABLET BY MOUTH 3 TIMES DAILY AS NEEDED. 30 tablet 0   diclofenac (VOLTAREN) 75 MG EC tablet TAKE ONE TABLET BY MOUTH TWICE DAILY. 60 tablet 2   ferrous sulfate 325 (65 FE) MG tablet Take 325 mg by mouth daily with breakfast.     loratadine (CLARITIN) 10 MG tablet Take 10 mg by mouth every morning.     Multiple Vitamins-Minerals (MULTIVITAMIN WITH MINERALS) tablet Take 1 tablet by mouth every morning.      OVER THE COUNTER MEDICATION  Calcium - 600mg  - vitamin d 1500units - magnesium - 120mg  - 2 tablets bid     OVER THE COUNTER MEDICATION Iron - 54mg  - vitamn c 90mg  - 1 qam - 2 pm     No current facility-administered medications on file prior to visit.    Allergies  Allergen Reactions   Penicillins Rash   Social History   Socioeconomic History   Marital status: Married    Spouse name: Not on file   Number of children: 2   Years of education:  HS   Highest education level: Not on file  Occupational History   Not on file  Social Needs   Financial resource strain: Not on file   Food insecurity    Worry: Not on file    Inability: Not on file   Transportation needs    Medical: Not on file    Non-medical: Not on file  Tobacco Use   Smoking status: Never Smoker   Smokeless tobacco: Never Used  Substance and Sexual Activity   Alcohol use: No    Alcohol/week: 0.0 standard drinks   Drug use: No   Sexual activity: Yes  Lifestyle   Physical activity    Days per week: Not on file    Minutes per session: Not on file   Stress: Not on file  Relationships   Social connections    Talks on phone: Not on file    Gets together: Not on file    Attends religious service: Not on file    Active member of club or organization: Not on file    Attends meetings of clubs or organizations: Not on file    Relationship status: Not on file   Intimate partner violence    Fear of current or ex partner: Not on file    Emotionally abused: Not on file    Physically abused: Not on file    Forced sexual activity: Not on file  Other Topics Concern   Not on file  Social History Narrative   Denies caffeine use       Review of Systems  All other systems reviewed and are negative.      Objective:   Physical Exam  Constitutional: He appears well-developed and well-nourished. No distress.  Cardiovascular: Normal rate, regular rhythm and normal heart sounds. Exam reveals no gallop and no friction rub.  No  murmur heard. Pulmonary/Chest: Effort normal and breath sounds normal. No respiratory distress. He has no wheezes. He has no rales.  Musculoskeletal:     Left shoulder: He exhibits tenderness, deformity, pain and decreased strength. He exhibits normal range of motion, no bony tenderness, no swelling, no effusion, no crepitus and no spasm.     Right knee: He exhibits normal range of motion, no effusion, no erythema, no LCL laxity, no bony tenderness, normal meniscus and no MCL laxity. Tenderness found. Medial joint line tenderness noted. No lateral joint line, no MCL and no LCL tenderness noted.       Arms:  Skin: He is not diaphoretic.  Vitals reviewed.       Assessment & Plan:  1. Left upper arm pain I believe the patient may have a partial tear in the head of the biceps or at least bicep tendinitis.  I have recommended a consultation with orthopedics for possible cortisone injection versus physical therapy evaluation.  In an effort to be cost effective, I will defer ordering the MRI until evaluated by orthopedics as I am uncertain of the diagnosis  2. Acute pain of right knee Pain in the right knee seems to be secondary to osteoarthritis.  Using sterile technique, I injected the right knee with 2 cc lidocaine, 2 cc of Marcaine, and 2 cc of 40 mg/mL Kenalog.  The patient tolerated the procedure well without complication

## 2019-07-05 ENCOUNTER — Encounter: Payer: Self-pay | Admitting: Family Medicine

## 2019-07-09 ENCOUNTER — Other Ambulatory Visit: Payer: Self-pay | Admitting: Family Medicine

## 2019-07-09 DIAGNOSIS — M7520 Bicipital tendinitis, unspecified shoulder: Secondary | ICD-10-CM

## 2019-07-09 NOTE — Telephone Encounter (Signed)
There has not been an orthopedic referral placed. Please advise?

## 2019-07-12 DIAGNOSIS — M25512 Pain in left shoulder: Secondary | ICD-10-CM | POA: Diagnosis not present

## 2019-07-16 DIAGNOSIS — M6281 Muscle weakness (generalized): Secondary | ICD-10-CM | POA: Diagnosis not present

## 2019-07-16 DIAGNOSIS — M25512 Pain in left shoulder: Secondary | ICD-10-CM | POA: Diagnosis not present

## 2019-07-16 DIAGNOSIS — S46112D Strain of muscle, fascia and tendon of long head of biceps, left arm, subsequent encounter: Secondary | ICD-10-CM | POA: Diagnosis not present

## 2019-07-30 DIAGNOSIS — S46112D Strain of muscle, fascia and tendon of long head of biceps, left arm, subsequent encounter: Secondary | ICD-10-CM | POA: Diagnosis not present

## 2019-07-30 DIAGNOSIS — M25512 Pain in left shoulder: Secondary | ICD-10-CM | POA: Diagnosis not present

## 2019-07-30 DIAGNOSIS — M6281 Muscle weakness (generalized): Secondary | ICD-10-CM | POA: Diagnosis not present

## 2019-07-31 ENCOUNTER — Encounter: Payer: Self-pay | Admitting: Adult Health

## 2019-07-31 ENCOUNTER — Ambulatory Visit: Payer: Federal, State, Local not specified - PPO | Admitting: Adult Health

## 2019-07-31 ENCOUNTER — Other Ambulatory Visit: Payer: Self-pay | Admitting: Family Medicine

## 2019-07-31 ENCOUNTER — Other Ambulatory Visit: Payer: Self-pay

## 2019-07-31 VITALS — BP 144/92 | HR 66 | Temp 97.7°F | Ht 65.0 in | Wt 229.4 lb

## 2019-07-31 DIAGNOSIS — Z9989 Dependence on other enabling machines and devices: Secondary | ICD-10-CM

## 2019-07-31 DIAGNOSIS — G4733 Obstructive sleep apnea (adult) (pediatric): Secondary | ICD-10-CM | POA: Diagnosis not present

## 2019-07-31 NOTE — Progress Notes (Addendum)
PATIENT: Kole Hilyard Vilar DOB: 1966-06-11  REASON FOR VISIT: follow up HISTORY FROM: patient  HISTORY OF PRESENT ILLNESS: Today 07/31/19:  Mr. Lema is a 53 year old male with a history of obstructive sleep apnea on CPAP.  His CPAP download indicates that he uses machine nightly for compliance of 100%.  He uses machine greater than 4 hours each night.  On average he uses his machine 7 hours and 1 minute.  His residual AHI is 1 on 6-12 cmH2O with EPR 3.  His leak in the 95th percentile is 13.8.  He reports that he continues to do well on the CPAP.  Denies any new issues.  He does report that his insurance is not covering his supplies per his DME company.  Reports that he can buy the mask cheaper offline without using his insurance.  He is questioning if he should switch to another DME company.  HISTORY 07/24/2018: I reviewed his AutoPap compliance data from 06/20/2018 through 07/19/2018, which is a total of 30 days, during which time he used his AutoPap every night with percent used days greater than 4 hours at 100%, indicating superb compliance with an average usage of 7 hours and 14 minutes, residual AHI at goal at 1 per hour, 95th percentile pressure at 10.7, leak acceptable, range of 6 cm to 12 cm with EPR. He reports doing very well. He is typically up to date with his supplies. His weight fluctuates a little bit, he continues to take all his supplements on a regular basis. He recently started having recurrent headaches and has a long-standing history of migraines he reports. He was given a prescription for Topamax by his PCP but never actually started it as his headaches improved. He reports that Excedrin Migraine helped but rather than taking that daily or regularly he finds that drinking diet ice tea helps. He is aware that caffeine withdrawal can also cause recurrent headaches.    REVIEW OF SYSTEMS: Out of a complete 14 system review of symptoms, the patient complains only of the  following symptoms, and all other reviewed systems are negative.  Epworth sleepiness score 7 fatigue severity score 14    ALLERGIES: Allergies  Allergen Reactions  . Penicillins Rash    HOME MEDICATIONS: Outpatient Medications Prior to Visit  Medication Sig Dispense Refill  . amLODipine-benazepril (LOTREL) 5-20 MG capsule TAKE (2) CAPSULES BY MOUTH ONCE EVERY MORNING. 60 capsule 3  . clotrimazole-betamethasone (LOTRISONE) cream APPLY TO THE AFFECTED AREA TOPICALLY TWICE DAILY. 45 g 0  . Cyanocobalamin 500 MCG/0.1ML SOLN Place 500 mcg into the nose once a week.    . cyclobenzaprine (FLEXERIL) 10 MG tablet TAKE 1 TABLET BY MOUTH 3 TIMES DAILY AS NEEDED. 30 tablet 0  . diclofenac (VOLTAREN) 75 MG EC tablet TAKE ONE TABLET BY MOUTH TWICE DAILY. 60 tablet 2  . ferrous sulfate 325 (65 FE) MG tablet Take 325 mg by mouth daily with breakfast.    . loratadine (CLARITIN) 10 MG tablet Take 10 mg by mouth every morning.    . Multiple Vitamins-Minerals (MULTIVITAMIN WITH MINERALS) tablet Take 1 tablet by mouth every morning.     Marland Kitchen OVER THE COUNTER MEDICATION Calcium - 600mg  - vitamin d 1500units - magnesium - 120mg  - 2 tablets bid    . OVER THE COUNTER MEDICATION Iron - 54mg  - vitamn c 90mg  - 1 qam - 2 pm     No facility-administered medications prior to visit.     PAST MEDICAL HISTORY: Past Medical  History:  Diagnosis Date  . Arthritis   . Borderline hyperglycemia   . Headache(784.0)   . Hypercholesteremia   . Hypertension   . Metabolic syndrome   . Shingles   . Sleep apnea    uses C PAP  . Swelling    both legs  . 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 Wrist pain     PAST SURGICAL HISTORY: Past Surgical History:  Procedure Laterality Date  . APPENDECTOMY  1986  . BREATH TEK H PYLORI N/A 08/08/2014   Procedure: BREATH TEK H PYLORI;  Surgeon: Edward Jolly, MD;  Location: [facility];  Service: General;  Laterality: N/A;  . COLONOSCOPY N/A 03/02/2013    Procedure: COLONOSCOPY;  Surgeon: Beryle Beams, MD;  Location: [facility];  Service: Endoscopy;  Laterality: N/A;  . 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 KNEE ARTHROSCOPY  2001   rt knee  . UPPER GI ENDOSCOPY  11/05/2014   Procedure: UPPER GI ENDOSCOPY;  Surgeon: Excell Seltzer, MD;  Location: [facility];  Service: General;;    FAMILY HISTORY: Family History  Problem Relation Age of Onset  . Hypertension Mother   . Hypertension Other   . Hyperlipidemia Other   . Heart disease Other   . Obesity Other   . Sleep apnea Other   . Breast cancer Maternal Grandmother   . Colon cancer Maternal Grandfather     SOCIAL HISTORY: Social History   Socioeconomic History  . Marital status: Married    Spouse name: Not on file  . Number of children: 2  . Years of education: HS  . Highest education level: Not on file  Occupational History  . Not on file  Social Needs  . Financial resource strain: Not on file  . Food insecurity    Worry: Not on file    Inability: Not on file  . Transportation needs    Medical: Not on file    Non-medical: Not on file  Tobacco Use  . Smoking status: Never Smoker  . Smokeless tobacco: Never Used  Substance and Sexual Activity  . Alcohol use: No    Alcohol/week: 0.0 standard drinks  . Drug use: No  . Sexual activity: Yes  Lifestyle  . Physical activity    Days per week: Not on file    Minutes per session: Not on file  . Stress: Not on file  Relationships  . Social Herbalist on phone: Not on file    Gets together: Not on file    Attends religious service: Not on file    Active member of club or organization: Not on file    Attends meetings of clubs or organizations: Not on file    Relationship status: Not on file  . Intimate partner violence    Fear of current or ex partner:  Not on file    Emotionally abused: Not on file    Physically abused: Not on file    Forced sexual activity: Not on file  Other Topics Concern  . Not on file  Social History Narrative   Denies caffeine use       PHYSICAL EXAM  There were no vitals filed for this visit. There is no height or weight on file to calculate BMI.  Generalized: Well developed, in no acute distress  Chest: Lungs clear  to auscultation bilaterally  Neurological examination  Mentation: Alert oriented to time, place, history taking. Follows all commands speech and language fluent Cranial nerve II-XII:  Extraocular movements were full, visual field were full on confrontational test.  Head turning and shoulder shrug  were normal and symmetric. Motor: The motor testing reveals 5 over 5 strength of all 4 extremities. Good symmetric motor tone is noted throughout.  Sensory: Sensory testing is intact to soft touch on all 4 extremities. No evidence of extinction is noted.  Coordination: Cerebellar testing reveals good finger-nose-finger and heel-to-shin bilaterally.  Gait and station: Gait is normal.  Reflexes: Deep tendon reflexes are symmetric and normal bilaterally.   DIAGNOSTIC DATA (LABS, IMAGING, TESTING) - I reviewed patient records, labs, notes, testing and imaging myself where available.  Lab Results  Component Value Date   WBC 7.1 10/26/2018   HGB 15.1 10/26/2018   HCT 45.0 10/26/2018   MCV 89.1 10/26/2018   PLT 352 10/26/2018      Component Value Date/Time   NA 140 02/09/2019 1559   K 4.2 02/09/2019 1559   CL 107 02/09/2019 1559   CO2 25 02/09/2019 1559   GLUCOSE 76 02/09/2019 1559   BUN 21 02/09/2019 1559   CREATININE 0.96 02/09/2019 1559   CALCIUM 9.2 02/09/2019 1559   PROT 6.3 02/09/2019 1559   ALBUMIN 4.0 04/26/2017 0829   AST 22 02/09/2019 1559   ALT 24 02/09/2019 1559   ALKPHOS 77 04/26/2017 0829   BILITOT 0.4 02/09/2019 1559   GFRNONAA 89 02/09/2019 1559   GFRAA 103 02/09/2019  1559   Lab Results  Component Value Date   CHOL 162 10/26/2018   HDL 53 10/26/2018   LDLCALC 96 10/26/2018   TRIG 43 10/26/2018   CHOLHDL 3.1 10/26/2018   Lab Results  Component Value Date   HGBA1C 5.6 10/26/2018   Lab Results  Component Value Date   VITAMINB12 1,345 (H) 10/26/2018   Lab Results  Component Value Date   TSH 1.17 04/26/2017      ASSESSMENT AND PLAN 53 y.o. 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 here with:  1.  Obstructive sleep apnea on CPAP  The patient's CPAP download shows excellent compliance and good treatment of his apnea.  He is encouraged to continue using CPAP nightly and greater than 4 hours each night.  He is advised that if his symptoms worsen or he develops new symptoms he should let us know.  He will follow-up in 1 year or sooner if needed    I spent 15 minutes with the patient. 50% of this time was spent reviewing CPAP download   Ward Givens, MSN, NP-C 07/31/2019, 3:02 PM [facility] [address], [place], [place] [address] [phone]  I reviewed the above note and documentation by the Nurse Practitioner and agree with the history, exam, assessment and plan as outlined above. I was available for consultation. Star Age, MD, PhD [facility])

## 2019-07-31 NOTE — Patient Instructions (Addendum)
Continue using CPAP nightly and greater than 4 hours each night If your symptoms worsen or you develop new symptoms please let us know.   Lennox Solders

## 2019-08-10 ENCOUNTER — Other Ambulatory Visit: Payer: Self-pay | Admitting: Family Medicine

## 2019-08-15 ENCOUNTER — Other Ambulatory Visit: Payer: Self-pay

## 2019-08-16 ENCOUNTER — Ambulatory Visit (INDEPENDENT_AMBULATORY_CARE_PROVIDER_SITE_OTHER): Payer: Federal, State, Local not specified - PPO | Admitting: *Deleted

## 2019-08-16 DIAGNOSIS — Z23 Encounter for immunization: Secondary | ICD-10-CM

## 2019-08-16 NOTE — Progress Notes (Signed)
Patient seen in office for Influenza Vaccination.   Tolerated IM administration well.   Immunization history updated.  

## 2019-08-29 ENCOUNTER — Other Ambulatory Visit: Payer: Self-pay | Admitting: Family Medicine

## 2019-09-06 DIAGNOSIS — M25512 Pain in left shoulder: Secondary | ICD-10-CM | POA: Diagnosis not present

## 2019-09-15 DIAGNOSIS — M25512 Pain in left shoulder: Secondary | ICD-10-CM | POA: Diagnosis not present

## 2019-09-25 DIAGNOSIS — M25512 Pain in left shoulder: Secondary | ICD-10-CM | POA: Diagnosis not present

## 2019-11-01 ENCOUNTER — Other Ambulatory Visit: Payer: Federal, State, Local not specified - PPO

## 2019-11-08 ENCOUNTER — Encounter: Payer: Federal, State, Local not specified - PPO | Admitting: Family Medicine

## 2019-11-13 ENCOUNTER — Other Ambulatory Visit: Payer: Self-pay | Admitting: Family Medicine

## 2019-11-13 ENCOUNTER — Other Ambulatory Visit: Payer: Federal, State, Local not specified - PPO

## 2019-11-13 ENCOUNTER — Other Ambulatory Visit: Payer: Self-pay

## 2019-11-13 DIAGNOSIS — Z0000 Encounter for general adult medical examination without abnormal findings: Secondary | ICD-10-CM

## 2019-11-14 LAB — COMPREHENSIVE METABOLIC PANEL
AG Ratio: 1.8 (calc) (ref 1.0–2.5)
ALT: 26 U/L (ref 9–46)
AST: 24 U/L (ref 10–35)
Albumin: 4.1 g/dL (ref 3.6–5.1)
Alkaline phosphatase (APISO): 77 U/L (ref 35–144)
BUN: 18 mg/dL (ref 7–25)
CO2: 28 mmol/L (ref 20–32)
Calcium: 9.4 mg/dL (ref 8.6–10.3)
Chloride: 103 mmol/L (ref 98–110)
Creat: 0.94 mg/dL (ref 0.70–1.33)
Globulin: 2.3 g/dL (calc) (ref 1.9–3.7)
Glucose, Bld: 92 mg/dL (ref 65–99)
Potassium: 4.4 mmol/L (ref 3.5–5.3)
Sodium: 139 mmol/L (ref 135–146)
Total Bilirubin: 0.6 mg/dL (ref 0.2–1.2)
Total Protein: 6.4 g/dL (ref 6.1–8.1)

## 2019-11-14 LAB — CBC WITH DIFFERENTIAL/PLATELET
Absolute Monocytes: 586 cells/uL (ref 200–950)
Basophils Absolute: 32 cells/uL (ref 0–200)
Basophils Relative: 0.5 %
Eosinophils Absolute: 158 cells/uL (ref 15–500)
Eosinophils Relative: 2.5 %
HCT: 45 % (ref 38.5–50.0)
Hemoglobin: 15 g/dL (ref 13.2–17.1)
Lymphs Abs: 2300 cells/uL (ref 850–3900)
MCH: 30.2 pg (ref 27.0–33.0)
MCHC: 33.3 g/dL (ref 32.0–36.0)
MCV: 90.7 fL (ref 80.0–100.0)
MPV: 10 fL (ref 7.5–12.5)
Monocytes Relative: 9.3 %
Neutro Abs: 3226 cells/uL (ref 1500–7800)
Neutrophils Relative %: 51.2 %
Platelets: 362 10*3/uL (ref 140–400)
RBC: 4.96 10*6/uL (ref 4.20–5.80)
RDW: 12.8 % (ref 11.0–15.0)
Total Lymphocyte: 36.5 %
WBC: 6.3 10*3/uL (ref 3.8–10.8)

## 2019-11-14 LAB — LIPID PANEL
Cholesterol: 156 mg/dL (ref <200)
HDL: 57 mg/dL (ref >=40)
LDL Cholesterol (Calc): 87 mg/dL (calc)
Non-HDL Cholesterol (Calc): 99 mg/dL (calc) (ref <130)
Total CHOL/HDL Ratio: 2.7 (calc) (ref <5.0)
Triglycerides: 50 mg/dL (ref <150)

## 2019-11-14 LAB — PSA: PSA: 0.4 ng/mL (ref <=4.0)

## 2019-11-20 ENCOUNTER — Ambulatory Visit (INDEPENDENT_AMBULATORY_CARE_PROVIDER_SITE_OTHER): Payer: Federal, State, Local not specified - PPO | Admitting: Family Medicine

## 2019-11-20 ENCOUNTER — Other Ambulatory Visit: Payer: Self-pay

## 2019-11-20 ENCOUNTER — Encounter: Payer: Self-pay | Admitting: Family Medicine

## 2019-11-20 VITALS — BP 140/98 | HR 64 | Temp 97.9°F | Resp 16 | Ht 65.0 in | Wt 231.0 lb

## 2019-11-20 DIAGNOSIS — E8881 Metabolic syndrome: Secondary | ICD-10-CM | POA: Diagnosis not present

## 2019-11-20 DIAGNOSIS — Z0000 Encounter for general adult medical examination without abnormal findings: Secondary | ICD-10-CM

## 2019-11-20 DIAGNOSIS — Z0001 Encounter for general adult medical examination with abnormal findings: Secondary | ICD-10-CM | POA: Diagnosis not present

## 2019-11-20 DIAGNOSIS — I10 Essential (primary) hypertension: Secondary | ICD-10-CM

## 2019-11-20 DIAGNOSIS — E7800 Pure hypercholesterolemia, unspecified: Secondary | ICD-10-CM

## 2019-11-20 DIAGNOSIS — Z9884 Bariatric surgery status: Secondary | ICD-10-CM | POA: Diagnosis not present

## 2019-11-20 NOTE — Progress Notes (Signed)
Subjective:    Patient ID: Brian Newton, male    DOB: 1966-08-11, 53 y.o.   MRN: [id number]  Patient is here today for complete physical exam.  Past medical history significant for gastric bypass.  He has a history of hypertension, hyperlipidemia, dyslipidemia, and metabolic syndrome.  Weight loss, however the patient has cured his metabolic syndrome as evidenced on his blood work below.  His blood pressure today however is elevated at 140/98.  Patient's blood pressure is usually well controlled.  He denies any chest pain shortness of breath or dyspnea on exertion.  His flu shot is up-to-date.  His tetanus shot is up-to-date.  He is due for the shingles vaccine and we discussed this.  His last colonoscopy was last year in June.  There were no polyps found however his gastroenterologist recommended a repeat colonoscopy in 2026 due to his history of polyps. Appointment on 11/13/2019  Component Date Value Ref Range Status  . WBC 11/13/2019 6.3  3.8 - 10.8 Thousand/uL Final  . RBC 11/13/2019 4.96  4.20 - 5.80 Million/uL Final  . Hemoglobin 11/13/2019 15.0  13.2 - 17.1 g/dL Final  . HCT 11/13/2019 45.0  38.5 - 50.0 % Final  . MCV 11/13/2019 90.7  80.0 - 100.0 fL Final  . MCH 11/13/2019 30.2  27.0 - 33.0 pg Final  . MCHC 11/13/2019 33.3  32.0 - 36.0 g/dL Final  . RDW 11/13/2019 12.8  11.0 - 15.0 % Final  . Platelets 11/13/2019 362  140 - 400 Thousand/uL Final  . MPV 11/13/2019 10.0  7.5 - 12.5 fL Final  . Neutro Abs 11/13/2019 3,226  1,500 - 7,800 cells/uL Final  . Lymphs Abs 11/13/2019 2,300  850 - 3,900 cells/uL Final  . Absolute Monocytes 11/13/2019 586  200 - 950 cells/uL Final  . Eosinophils Absolute 11/13/2019 158  15 - 500 cells/uL Final  . Basophils Absolute 11/13/2019 32  0 - 200 cells/uL Final  . Neutrophils Relative % 11/13/2019 51.2  % Final  . Total Lymphocyte 11/13/2019 36.5  % Final  . Monocytes Relative 11/13/2019 9.3  % Final  . Eosinophils Relative 11/13/2019 2.5  % Final   . Basophils Relative 11/13/2019 0.5  % Final  . PSA 11/13/2019 0.4  < OR = 4.0 ng/mL Final   Comment: The total PSA value from this assay system is  standardized against the WHO standard. The test  result will be approximately 20% lower when compared  to the equimolar-standardized total PSA (Beckman  Coulter). Comparison of serial PSA results should be  interpreted with this fact in mind. . This test was performed using the Siemens  chemiluminescent method. Values obtained from  different assay methods cannot be used interchangeably. PSA levels, regardless of value, should not be interpreted as absolute evidence of the presence or absence of disease.   . Cholesterol 11/13/2019 156  <200 mg/dL Final  . HDL 11/13/2019 57  > OR = 40 mg/dL Final  . Triglycerides 11/13/2019 50  <150 mg/dL Final  . LDL Cholesterol (Calc) 11/13/2019 87  mg/dL (calc) Final   Comment: Reference range: <100 . Desirable range <100 mg/dL for primary prevention;   <70 mg/dL for patients with CHD or diabetic patients  with > or = 2 CHD risk factors. Marland Kitchen LDL-C is now calculated using the Martin-Hopkins  calculation, which is a validated novel method providing  better accuracy than the Friedewald equation in the  estimation of LDL-C.  Cresenciano Genre et al. Annamaria Helling. [id number]): [phone]  (  http://education.[facility].com/faq/FAQ164)   . Total CHOL/HDL Ratio 11/13/2019 2.7  <5.0 (calc) Final  . Non-HDL Cholesterol (Calc) 11/13/2019 99  <130 mg/dL (calc) Final   Comment: For patients with diabetes plus 1 major ASCVD risk  factor, treating to a non-HDL-C goal of <100 mg/dL  (LDL-C of <70 mg/dL) is considered a therapeutic  option.   . Glucose, Bld 11/13/2019 92  65 - 99 mg/dL Final   Comment: .            Fasting reference interval .   . BUN 11/13/2019 18  7 - 25 mg/dL Final  . Creat 11/13/2019 0.94  0.70 - 1.33 mg/dL Final   Comment: For patients >87 years of age, the reference limit for Creatinine is  approximately 13% higher for people identified as African-American. .   Havery Moros Ratio [id number] NOT APPLICABLE  6 - 22 (calc) Final  . Sodium 11/13/2019 139  135 - 146 mmol/L Final  . Potassium 11/13/2019 4.4  3.5 - 5.3 mmol/L Final  . Chloride 11/13/2019 103  98 - 110 mmol/L Final  . CO2 11/13/2019 28  20 - 32 mmol/L Final  . Calcium 11/13/2019 9.4  8.6 - 10.3 mg/dL Final  . Total Protein 11/13/2019 6.4  6.1 - 8.1 g/dL Final  . Albumin 11/13/2019 4.1  3.6 - 5.1 g/dL Final  . Globulin 11/13/2019 2.3  1.9 - 3.7 g/dL (calc) Final  . AG Ratio 11/13/2019 1.8  1.0 - 2.5 (calc) Final  . Total Bilirubin 11/13/2019 0.6  0.2 - 1.2 mg/dL Final  . Alkaline phosphatase (APISO) 11/13/2019 77  35 - 144 U/L Final  . AST 11/13/2019 24  10 - 35 U/L Final  . ALT 11/13/2019 26  9 - 46 U/L Final    Past Medical History:  Diagnosis Date  . Arthritis   . Borderline hyperglycemia   . Headache(784.0)   . Hypercholesteremia   . Hypertension   . Metabolic syndrome   . Shingles   . Sleep apnea    uses C PAP  . Swelling    both legs  . 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 Wrist pain    Current Outpatient Medications on File Prior to Visit  Medication Sig Dispense Refill  . amLODipine-benazepril (LOTREL) 5-20 MG capsule TAKE (2) CAPSULES BY MOUTH ONCE EVERY MORNING. 180 capsule 3  . clotrimazole-betamethasone (LOTRISONE) cream APPLY TO THE AFFECTED AREA TOPICALLY TWICE DAILY. 45 g 2  . Cyanocobalamin 500 MCG/0.1ML SOLN Place 500 mcg into the nose once a week.    . cyclobenzaprine (FLEXERIL) 10 MG tablet TAKE 1 TABLET BY MOUTH 3 TIMES DAILY AS NEEDED. 30 tablet 0  . diclofenac (VOLTAREN) 75 MG EC tablet TAKE ONE TABLET BY MOUTH TWICE DAILY. 60 tablet 2  . ferrous sulfate 325 (65 FE) MG tablet Take 325 mg by mouth daily with breakfast.    . loratadine (CLARITIN) 10 MG tablet Take 10 mg by mouth every morning.    . Multiple Vitamins-Minerals (MULTIVITAMIN WITH MINERALS)  tablet Take 1 tablet by mouth every morning.     Marland Kitchen OVER THE COUNTER MEDICATION Calcium - 600mg  - vitamin d 1500units - magnesium - 120mg  - 2 tablets bid    . OVER THE COUNTER MEDICATION Iron - 54mg  - vitamn c 90mg  - 1 qam - 2 pm     No current facility-administered medications on file prior to visit.    Allergies  Allergen Reactions  . Penicillins Rash   Social History  Socioeconomic History  . Marital status: Married    Spouse name: Not on file  . Number of children: 2  . Years of education: HS  . Highest education level: Not on file  Occupational History  . Not on file  Social Needs  . Financial resource strain: Not on file  . Food insecurity    Worry: Not on file    Inability: Not on file  . Transportation needs    Medical: Not on file    Non-medical: Not on file  Tobacco Use  . Smoking status: Never Smoker  . Smokeless tobacco: Never Used  Substance and Sexual Activity  . Alcohol use: No    Alcohol/week: 0.0 standard drinks  . Drug use: No  . Sexual activity: Yes  Lifestyle  . Physical activity    Days per week: Not on file    Minutes per session: Not on file  . Stress: Not on file  Relationships  . Social Herbalist on phone: Not on file    Gets together: Not on file    Attends religious service: Not on file    Active member of club or organization: Not on file    Attends meetings of clubs or organizations: Not on file    Relationship status: Not on file  . Intimate partner violence    Fear of current or ex partner: Not on file    Emotionally abused: Not on file    Physically abused: Not on file    Forced sexual activity: Not on file  Other Topics Concern  . Not on file  Social History Narrative   Denies caffeine use       Review of Systems  All other systems reviewed and are negative.      Objective:   Physical Exam  Constitutional: He is oriented to person, place, and time. He appears well-developed and well-nourished. No  distress.  HENT:  Head: Normocephalic and atraumatic.  Right Ear: External ear normal.  Left Ear: External ear normal.  Nose: Nose normal.  Mouth/Throat: Oropharynx is clear and moist. No oropharyngeal exudate.  Eyes: Pupils are equal, round, and reactive to light. Conjunctivae and EOM are normal. Right eye exhibits no discharge. Left eye exhibits no discharge. No scleral icterus.  Neck: Neck supple. No JVD present. No tracheal deviation present. No thyromegaly present.  Cardiovascular: Normal rate, regular rhythm, normal heart sounds and intact distal pulses. Exam reveals no gallop and no friction rub.  No murmur heard. Pulmonary/Chest: Effort normal and breath sounds normal. No respiratory distress. He has no wheezes. He has no rales. He exhibits no tenderness.  Abdominal: Soft. Bowel sounds are normal. He exhibits no distension and no mass. There is no abdominal tenderness. There is no rebound and no guarding.  Musculoskeletal: Normal range of motion.        General: No tenderness, deformity or edema.  Lymphadenopathy:    He has no cervical adenopathy.  Neurological: He is alert and oriented to person, place, and time. He has normal reflexes. No cranial nerve deficit. He exhibits normal muscle tone. Coordination normal.  Skin: Skin is warm. No rash noted. He is not diaphoretic. No erythema. No pallor.  Psychiatric: He has a normal mood and affect. His behavior is normal. Judgment and thought content normal.  Vitals reviewed.       Assessment & Plan:  General medical exam  Metabolic syndrome  Morbid obesity ([place])  Hypercholesteremia  Essential hypertension  Patient's physical  exam is completely normal.  Immunizations are up-to-date.  Cancer screening is up-to-date.  Lab work is excellent.  I am concerned about his blood pressure.  I encouraged the patient to check his blood pressure 2 times a day for the next week and then notify us of the values.  If persistently greater than  140/90, I would recommend additional medication to help control his blood pressure.  The remainder of his preventative care is up-to-date.

## 2019-12-04 ENCOUNTER — Encounter: Payer: Federal, State, Local not specified - PPO | Admitting: Family Medicine

## 2020-01-08 ENCOUNTER — Telehealth: Payer: Self-pay | Admitting: Family Medicine

## 2020-01-08 NOTE — Telephone Encounter (Signed)
Paperwork completed and put in Dr. Samella Parr green folder.

## 2020-01-08 NOTE — Telephone Encounter (Signed)
Received [facility] paperwork for patient will route to [place]/ dr pickard for completion

## 2020-01-11 NOTE — Telephone Encounter (Signed)
Paper work done - give to [place] up front to call and collect form fee before faxing

## 2020-02-11 ENCOUNTER — Ambulatory Visit: Payer: Federal, State, Local not specified - PPO | Admitting: Family Medicine

## 2020-02-11 ENCOUNTER — Other Ambulatory Visit: Payer: Self-pay

## 2020-02-11 ENCOUNTER — Encounter: Payer: Self-pay | Admitting: Family Medicine

## 2020-02-11 VITALS — BP 148/100 | HR 80 | Temp 98.2°F | Resp 16 | Ht 65.0 in | Wt 225.0 lb

## 2020-02-11 DIAGNOSIS — G43001 Migraine without aura, not intractable, with status migrainosus: Secondary | ICD-10-CM | POA: Diagnosis not present

## 2020-02-11 MED ORDER — PROMETHAZINE HCL 25 MG/ML IJ SOLN
25.0000 mg | Freq: Once | INTRAMUSCULAR | Status: AC
  Administered 2020-02-11: 25 mg via INTRAMUSCULAR

## 2020-02-11 MED ORDER — BUTALBITAL-APAP-CAFFEINE 50-325-40 MG PO TABS: 1.0000 | ORAL_TABLET | Freq: Four times a day (QID) | ORAL | 0 refills | Status: DC | PRN

## 2020-02-11 MED ORDER — PROMETHAZINE HCL 25 MG PO TABS: 25.0000 mg | ORAL_TABLET | Freq: Once | ORAL | Status: DC

## 2020-02-11 MED ORDER — KETOROLAC TROMETHAMINE 60 MG/2ML IM SOLN
60.0000 mg | Freq: Once | INTRAMUSCULAR | Status: AC
  Administered 2020-02-11: 60 mg via INTRAMUSCULAR

## 2020-02-11 NOTE — Progress Notes (Signed)
Subjective:    Patient ID: Brian Newton, male    DOB: [birth year], 54 y.o.   MRN: [id number]  HPI  Patient has a longstanding history of migraines.  Typically they occur infrequently and he is able to manage them by taking an Excedrin Migraine.  However he has had this migraine headache now for approximately 3 days.  He has photophobia.  He has phonophobia.  Headache is located in the front of his head.  It is pulsatile in nature.  He also has nausea.  He denies any dizziness.  He denies any neurologic deficit.  He denies any aura or blurry vision or double vision.  He is requesting a "shot" to help alleviate the migraine.  In the past he has tried Toradol and Phenergan with success.  Past Medical History:  Diagnosis Date  . Arthritis   . Borderline hyperglycemia   . Headache(784.0)   . Hypercholesteremia   . Hypertension   . Metabolic syndrome   . Shingles   . Sleep apnea    uses C PAP  . Swelling    both legs  . 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 Wrist pain    Current Outpatient Medications on File Prior to Visit  Medication Sig Dispense Refill  . amLODipine-benazepril (LOTREL) 5-20 MG capsule TAKE (2) CAPSULES BY MOUTH ONCE EVERY MORNING. 180 capsule 3  . clotrimazole-betamethasone (LOTRISONE) cream APPLY TO THE AFFECTED AREA TOPICALLY TWICE DAILY. 45 g 2  . Cyanocobalamin 500 MCG/0.1ML SOLN Place 500 mcg into the nose once a week.    . cyclobenzaprine (FLEXERIL) 10 MG tablet TAKE 1 TABLET BY MOUTH 3 TIMES DAILY AS NEEDED. 30 tablet 0  . diclofenac (VOLTAREN) 75 MG EC tablet TAKE ONE TABLET BY MOUTH TWICE DAILY. 60 tablet 2  . ferrous sulfate 325 (65 FE) MG tablet Take 325 mg by mouth daily with breakfast.    . loratadine (CLARITIN) 10 MG tablet Take 10 mg by mouth every morning.    . Multiple Vitamins-Minerals (MULTIVITAMIN WITH MINERALS) tablet Take 1 tablet by mouth every morning.     Marland Kitchen OVER THE COUNTER MEDICATION Calcium - 600mg  - vitamin d 1500units  - magnesium - 120mg  - 2 tablets bid    . OVER THE COUNTER MEDICATION Iron - 54mg  - vitamn c 90mg  - 1 qam - 2 pm     No current facility-administered medications on file prior to visit.   Allergies  Allergen Reactions  . Penicillins Rash   Social History   Socioeconomic History  . Marital status: Married    Spouse name: Not on file  . Number of children: 2  . Years of education: HS  . Highest education level: Not on file  Occupational History  . Not on file  Tobacco Use  . Smoking status: Never Smoker  . Smokeless tobacco: Never Used  Substance and Sexual Activity  . Alcohol use: No    Alcohol/week: 0.0 standard drinks  . Drug use: No  . Sexual activity: Yes  Other Topics Concern  . Not on file  Social History Narrative   Denies caffeine use    Social Determinants of Health   Financial Resource Strain:   . Difficulty of Paying Living Expenses: Not on file  Food Insecurity:   . Worried About Charity fundraiser in the Last Year: Not on file  . Ran Out of Food in the Last Year: Not on file  Transportation Needs:   . Lack of Transportation (Medical):  Not on file  . Lack of Transportation (Non-Medical): Not on file  Physical Activity:   . Days of Exercise per Week: Not on file  . Minutes of Exercise per Session: Not on file  Stress:   . Feeling of Stress : Not on file  Social Connections:   . Frequency of Communication with Friends and Family: Not on file  . Frequency of Social Gatherings with Friends and Family: Not on file  . Attends Religious Services: Not on file  . Active Member of Clubs or Organizations: Not on file  . Attends Archivist Meetings: Not on file  . Marital Status: Not on file  Intimate Partner Violence:   . Fear of Current or Ex-Partner: Not on file  . Emotionally Abused: Not on file  . Physically Abused: Not on file  . Sexually Abused: Not on file      Review of Systems  All other systems reviewed and are negative.        Objective:   Physical Exam  Constitutional: He is oriented to person, place, and time. He appears well-developed and well-nourished. No distress.  Cardiovascular: Normal rate, regular rhythm and normal heart sounds. Exam reveals no gallop and no friction rub.  No murmur heard. Pulmonary/Chest: Effort normal and breath sounds normal. No respiratory distress. He has no wheezes. He has no rales.  Neurological: He is alert and oriented to person, place, and time. No cranial nerve deficit. He exhibits normal muscle tone. Coordination normal.  Skin: He is not diaphoretic.  Vitals reviewed.       Assessment & Plan:  Migraine without aura and with status migrainosus, not intractable Patient received 60 mg of Toradol IM 25 mg of Phenergan IM.  He was monitored in the office for 15 minutes to ensure that there was no adverse reaction from the injection and was allowed to leave.  I will give the patient Fioricet 1 tablet every 6 hours as needed for breakthrough headache.  If he continues to experience frequent severe migraine headaches we may want to consider aimovig for prevention.

## 2020-02-11 NOTE — Addendum Note (Signed)
Addended by: Shary Decamp B on: 02/11/2020 11:04 AM   Modules accepted: Orders

## 2020-02-13 ENCOUNTER — Other Ambulatory Visit: Payer: Self-pay | Admitting: Family Medicine

## 2020-04-08 ENCOUNTER — Other Ambulatory Visit: Payer: Self-pay | Admitting: Family Medicine

## 2020-05-07 ENCOUNTER — Other Ambulatory Visit: Payer: Self-pay | Admitting: Family Medicine

## 2020-06-11 ENCOUNTER — Other Ambulatory Visit: Payer: Self-pay | Admitting: Family Medicine

## 2020-06-11 NOTE — Telephone Encounter (Signed)
Ok to refill 

## 2020-07-01 ENCOUNTER — Other Ambulatory Visit: Payer: Self-pay | Admitting: Family Medicine

## 2020-07-30 ENCOUNTER — Encounter: Payer: Self-pay | Admitting: Adult Health

## 2020-07-31 ENCOUNTER — Ambulatory Visit: Payer: Federal, State, Local not specified - PPO | Admitting: Adult Health

## 2020-07-31 ENCOUNTER — Other Ambulatory Visit: Payer: Self-pay

## 2020-07-31 ENCOUNTER — Encounter: Payer: Self-pay | Admitting: Adult Health

## 2020-07-31 VITALS — BP 157/83 | HR 58 | Ht 65.0 in | Wt 235.0 lb

## 2020-07-31 DIAGNOSIS — G4733 Obstructive sleep apnea (adult) (pediatric): Secondary | ICD-10-CM

## 2020-07-31 DIAGNOSIS — Z9989 Dependence on other enabling machines and devices: Secondary | ICD-10-CM | POA: Diagnosis not present

## 2020-07-31 NOTE — Patient Instructions (Signed)
Continue using CPAP nightly and greater than 4 hours each night °If your symptoms worsen or you develop new symptoms please let us know.  ° °

## 2020-07-31 NOTE — Progress Notes (Signed)
PATIENT: Brian Newton DOB: [birth year]  REASON FOR VISIT: follow up HISTORY FROM: patient  HISTORY OF PRESENT ILLNESS: Today 07/31/20:  Brian Newton is a 54 year old male with a history of obstructive sleep apnea on CPAP.  His download indicates that he uses machine nightly for compliance of 100%.  He uses machine greater than 4 hours each night.  On average he uses his machine 7 hours and 27 minutes.  His residual AHI is 1.3 on 6 to 12 cm of water with EPR of 3.  His leak in the 95th percentile is 31.4 L/min.  He reports that the CPAP continues to work well for him.  He returns today for an evaluation.  HISTORY 07/31/19:  Brian Newton is a 54 year old male with a history of obstructive sleep apnea on CPAP.  His CPAP download indicates that he uses machine nightly for compliance of 100%.  He uses machine greater than 4 hours each night.  On average he uses his machine 7 hours and 1 minute.  His residual AHI is 1 on 6-12 cmH2O with EPR 3.  His leak in the 95th percentile is 13.8.  He reports that he continues to do well on the CPAP.  Denies any new issues.  He does report that his insurance is not covering his supplies per his DME company.  Reports that he can buy the mask cheaper offline without using his insurance.  He is questioning if he should switch to another DME company.  REVIEW OF SYSTEMS: Out of a complete 14 system review of symptoms, the patient complains only of the following symptoms, and all other reviewed systems are negative.  FSS 21 ESS 8  ALLERGIES: Allergies  Allergen Reactions  . Penicillins Rash    HOME MEDICATIONS: Outpatient Medications Prior to Visit  Medication Sig Dispense Refill  . amLODipine-benazepril (LOTREL) 5-20 MG capsule TAKE (2) CAPSULES BY MOUTH ONCE EVERY MORNING. 60 capsule 0  . butalbital-acetaminophen-caffeine (FIORICET) 50-325-40 MG tablet Take 1-2 tablets by mouth every 6 (six) hours as needed for headache. 20 tablet 0  .  clotrimazole-betamethasone (LOTRISONE) cream APPLY TO THE AFFECTED AREA TOPICALLY TWICE DAILY. 45 g 2  . Cyanocobalamin 500 MCG/0.1ML SOLN Place 500 mcg into the nose once a week.    . cyclobenzaprine (FLEXERIL) 10 MG tablet TAKE 1 TABLET BY MOUTH 3 TIMES DAILY AS NEEDED. 30 tablet 0  . diclofenac (VOLTAREN) 75 MG EC tablet TAKE ONE TABLET BY MOUTH TWICE DAILY. 60 tablet 5  . ferrous sulfate 325 (65 FE) MG tablet Take 325 mg by mouth daily with breakfast.    . loratadine (CLARITIN) 10 MG tablet Take 10 mg by mouth every morning.    . Multiple Vitamins-Minerals (MULTIVITAMIN WITH MINERALS) tablet Take 1 tablet by mouth every morning.     Marland Kitchen OVER THE COUNTER MEDICATION Calcium - 600mg  - vitamin d 1500units - magnesium - 120mg  - 2 tablets bid    . OVER THE COUNTER MEDICATION Iron - 54mg  - vitamn c 90mg  - 1 qam - 2 pm     No facility-administered medications prior to visit.    PAST MEDICAL HISTORY: Past Medical History:  Diagnosis Date  . Arthritis   . Borderline hyperglycemia   . Headache(784.0)   . Hypercholesteremia   . Hypertension   . Metabolic syndrome   . Shingles   . Sleep apnea    uses C PAP  . Swelling    both legs  . 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  Wrist pain     PAST SURGICAL HISTORY: Past Surgical History:  Procedure Laterality Date  . APPENDECTOMY  1986  . BREATH TEK H PYLORI N/A 08/08/2014   Procedure: BREATH TEK H PYLORI;  Surgeon: Edward Jolly, MD;  Location: [facility];  Service: General;  Laterality: N/A;  . COLONOSCOPY N/A 03/02/2013   Procedure: COLONOSCOPY;  Surgeon: Beryle Beams, MD;  Location: [facility];  Service: Endoscopy;  Laterality: N/A;  . 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 KNEE ARTHROSCOPY  2001   rt knee  . UPPER GI ENDOSCOPY  11/05/2014   Procedure:  UPPER GI ENDOSCOPY;  Surgeon: Excell Seltzer, MD;  Location: [facility];  Service: General;;    FAMILY HISTORY: Family History  Problem Relation Age of Onset  . Hypertension Mother   . Hypertension Other   . Hyperlipidemia Other   . Heart disease Other   . Obesity Other   . Sleep apnea Other   . Breast cancer Maternal Grandmother   . Colon cancer Maternal Grandfather     SOCIAL HISTORY: Social History   Socioeconomic History  . Marital status: Married    Spouse name: Not on file  . Number of children: 2  . Years of education: HS  . Highest education level: Not on file  Occupational History  . Not on file  Tobacco Use  . Smoking status: Never Smoker  . Smokeless tobacco: Never Used  Substance and Sexual Activity  . Alcohol use: No    Alcohol/week: 0.0 standard drinks  . Drug use: No  . Sexual activity: Yes  Other Topics Concern  . Not on file  Social History Narrative   Denies caffeine use    Social Determinants of Health   Financial Resource Strain:   . Difficulty of Paying Living Expenses:   Food Insecurity:   . Worried About Charity fundraiser in the Last Year:   . Arboriculturist in the Last Year:   Transportation Needs:   . Film/video editor (Medical):   Marland Kitchen Lack of Transportation (Non-Medical):   Physical Activity:   . Days of Exercise per Week:   . Minutes of Exercise per Session:   Stress:   . Feeling of Stress :   Social Connections:   . Frequency of Communication with Friends and Family:   . Frequency of Social Gatherings with Friends and Family:   . Attends Religious Services:   . Active Member of Clubs or Organizations:   . Attends Archivist Meetings:   Marland Kitchen Marital Status:   Intimate Partner Violence:   . Fear of Current or Ex-Partner:   . Emotionally Abused:   Marland Kitchen Physically Abused:   . Sexually Abused:       PHYSICAL EXAM  Vitals:   07/31/20 1439  BP: (!) 157/83  Pulse: (!) 58  Weight: 235 lb (106.6 kg)  Height: 5'  5" (1.651 m)   Body mass index is 39.11 kg/m.  Generalized: Well developed, in no acute distress  Chest: Lungs clear to auscultation bilaterally  Neurological examination  Mentation: Alert oriented to time, place, history taking. Follows all commands speech and language fluent Cranial nerve II-XII: Extraocular movements were full, visual field were full on confrontational test Head turning and shoulder shrug  were normal and symmetric. Motor: The motor testing reveals 5 over  5 strength of all 4 extremities. Good symmetric motor tone is noted throughout.  Sensory: Sensory testing is intact to soft touch on all 4 extremities. No evidence of extinction is noted.  Gait and station: Gait is normal.    DIAGNOSTIC DATA (LABS, IMAGING, TESTING) - I reviewed patient records, labs, notes, testing and imaging myself where available.  Lab Results  Component Value Date   WBC 6.3 11/13/2019   HGB 15.0 11/13/2019   HCT 45.0 11/13/2019   MCV 90.7 11/13/2019   PLT 362 11/13/2019      Component Value Date/Time   NA 139 11/13/2019 0809   K 4.4 11/13/2019 0809   CL 103 11/13/2019 0809   CO2 28 11/13/2019 0809   GLUCOSE 92 11/13/2019 0809   BUN 18 11/13/2019 0809   CREATININE 0.94 11/13/2019 0809   CALCIUM 9.4 11/13/2019 0809   PROT 6.4 11/13/2019 0809   ALBUMIN 4.0 04/26/2017 0829   AST 24 11/13/2019 0809   ALT 26 11/13/2019 0809   ALKPHOS 77 04/26/2017 0829   BILITOT 0.6 11/13/2019 0809   GFRNONAA 89 02/09/2019 1559   GFRAA 103 02/09/2019 1559   Lab Results  Component Value Date   CHOL 156 11/13/2019   HDL 57 11/13/2019   LDLCALC 87 11/13/2019   TRIG 50 11/13/2019   CHOLHDL 2.7 11/13/2019   Lab Results  Component Value Date   HGBA1C 5.6 10/26/2018   Lab Results  Component Value Date   VITAMINB12 1,345 (H) 10/26/2018   Lab Results  Component Value Date   TSH 1.17 04/26/2017      ASSESSMENT AND PLAN 54 y.o. 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 here with:  1. OSA on CPAP  - CPAP compliance excellent - Good treatment of AHI  - Encourage patient to use CPAP nightly and > 4 hours each night - F/U in 1 year or sooner if needed   I spent 20 minutes of face-to-face and non-face-to-face time with patient.  This included previsit chart review, lab review, study review, order entry, electronic health record documentation, patient education.  Ward Givens, MSN, NP-C 07/31/2020, 2:52 PM [facility] [address] [place], [place] [address] [phone]

## 2020-08-04 ENCOUNTER — Other Ambulatory Visit: Payer: Self-pay | Admitting: Nurse Practitioner

## 2020-08-29 ENCOUNTER — Other Ambulatory Visit: Payer: Self-pay | Admitting: Nurse Practitioner

## 2020-09-18 ENCOUNTER — Other Ambulatory Visit: Payer: Self-pay | Admitting: Family Medicine

## 2020-09-18 NOTE — Telephone Encounter (Signed)
Ok to refill 

## 2020-10-15 ENCOUNTER — Ambulatory Visit: Payer: Federal, State, Local not specified - PPO

## 2020-10-29 ENCOUNTER — Ambulatory Visit (INDEPENDENT_AMBULATORY_CARE_PROVIDER_SITE_OTHER): Payer: Federal, State, Local not specified - PPO

## 2020-10-29 ENCOUNTER — Other Ambulatory Visit: Payer: Self-pay

## 2020-10-29 DIAGNOSIS — Z23 Encounter for immunization: Secondary | ICD-10-CM | POA: Diagnosis not present

## 2020-11-18 ENCOUNTER — Other Ambulatory Visit: Payer: Federal, State, Local not specified - PPO

## 2020-11-18 ENCOUNTER — Other Ambulatory Visit: Payer: Self-pay

## 2020-11-18 DIAGNOSIS — Z9884 Bariatric surgery status: Secondary | ICD-10-CM

## 2020-11-18 DIAGNOSIS — I10 Essential (primary) hypertension: Secondary | ICD-10-CM | POA: Diagnosis not present

## 2020-11-18 DIAGNOSIS — E8881 Metabolic syndrome: Secondary | ICD-10-CM

## 2020-11-18 DIAGNOSIS — E250 Congenital adrenogenital disorders associated with enzyme deficiency: Secondary | ICD-10-CM

## 2020-11-18 NOTE — Addendum Note (Signed)
Addended by: Saundra Shelling on: 11/18/2020 09:09 AM   Modules accepted: Orders

## 2020-11-19 LAB — COMPLETE METABOLIC PANEL WITH GFR
AG Ratio: 1.7 (calc) (ref 1.0–2.5)
ALT: 25 U/L (ref 9–46)
AST: 23 U/L (ref 10–35)
Albumin: 3.9 g/dL (ref 3.6–5.1)
Alkaline phosphatase (APISO): 84 U/L (ref 35–144)
BUN: 15 mg/dL (ref 7–25)
CO2: 29 mmol/L (ref 20–32)
Calcium: 9.3 mg/dL (ref 8.6–10.3)
Chloride: 103 mmol/L (ref 98–110)
Creat: 1 mg/dL (ref 0.70–1.33)
GFR, Est African American: 97 mL/min/{1.73_m2} (ref >=60)
GFR, Est Non African American: 84 mL/min/{1.73_m2} (ref >=60)
Globulin: 2.3 g/dL (calc) (ref 1.9–3.7)
Glucose, Bld: 84 mg/dL (ref 65–99)
Potassium: 4.5 mmol/L (ref 3.5–5.3)
Sodium: 140 mmol/L (ref 135–146)
Total Bilirubin: 0.4 mg/dL (ref 0.2–1.2)
Total Protein: 6.2 g/dL (ref 6.1–8.1)

## 2020-11-19 LAB — CBC WITH DIFFERENTIAL/PLATELET
Absolute Monocytes: 512 cells/uL (ref 200–950)
Basophils Absolute: 19 cells/uL (ref 0–200)
Basophils Relative: 0.3 %
Eosinophils Absolute: 211 cells/uL (ref 15–500)
Eosinophils Relative: 3.3 %
HCT: 46.8 % (ref 38.5–50.0)
Hemoglobin: 15.4 g/dL (ref 13.2–17.1)
Lymphs Abs: 2336 cells/uL (ref 850–3900)
MCH: 30.6 pg (ref 27.0–33.0)
MCHC: 32.9 g/dL (ref 32.0–36.0)
MCV: 93 fL (ref 80.0–100.0)
MPV: 10 fL (ref 7.5–12.5)
Monocytes Relative: 8 %
Neutro Abs: 3322 cells/uL (ref 1500–7800)
Neutrophils Relative %: 51.9 %
Platelets: 353 10*3/uL (ref 140–400)
RBC: 5.03 10*6/uL (ref 4.20–5.80)
RDW: 13.3 % (ref 11.0–15.0)
Total Lymphocyte: 36.5 %
WBC: 6.4 10*3/uL (ref 3.8–10.8)

## 2020-11-19 LAB — LIPID PANEL
Cholesterol: 169 mg/dL (ref <200)
HDL: 57 mg/dL (ref >=40)
LDL Cholesterol (Calc): 95 mg/dL (calc)
Non-HDL Cholesterol (Calc): 112 mg/dL (calc) (ref <130)
Total CHOL/HDL Ratio: 3 (calc) (ref <5.0)
Triglycerides: 83 mg/dL (ref <150)

## 2020-11-19 LAB — VITAMIN D 25 HYDROXY (VIT D DEFICIENCY, FRACTURES): Vit D, 25-Hydroxy: 63 ng/mL (ref 30–100)

## 2020-11-19 LAB — IRON,TIBC AND FERRITIN PANEL
%SAT: 33 % (calc) (ref 20–48)
Ferritin: 36 ng/mL — ABNORMAL LOW (ref 38–380)
Iron: 107 ug/dL (ref 50–180)
TIBC: 324 mcg/dL (calc) (ref 250–425)

## 2020-11-19 LAB — VITAMIN B12: Vitamin B-12: 887 pg/mL (ref 200–1100)

## 2020-11-19 LAB — PSA: PSA: 0.45 ng/mL (ref <=4.0)

## 2020-11-25 ENCOUNTER — Ambulatory Visit (INDEPENDENT_AMBULATORY_CARE_PROVIDER_SITE_OTHER): Payer: Federal, State, Local not specified - PPO | Admitting: Family Medicine

## 2020-11-25 ENCOUNTER — Encounter: Payer: Self-pay | Admitting: Family Medicine

## 2020-11-25 ENCOUNTER — Other Ambulatory Visit: Payer: Self-pay

## 2020-11-25 VITALS — BP 150/94 | HR 76 | Temp 97.9°F | Ht 65.0 in | Wt 241.0 lb

## 2020-11-25 DIAGNOSIS — I10 Essential (primary) hypertension: Secondary | ICD-10-CM | POA: Diagnosis not present

## 2020-11-25 DIAGNOSIS — Z0000 Encounter for general adult medical examination without abnormal findings: Secondary | ICD-10-CM

## 2020-11-25 DIAGNOSIS — Z0001 Encounter for general adult medical examination with abnormal findings: Secondary | ICD-10-CM

## 2020-11-25 DIAGNOSIS — E8881 Metabolic syndrome: Secondary | ICD-10-CM | POA: Diagnosis not present

## 2020-11-25 DIAGNOSIS — Z09 Encounter for follow-up examination after completed treatment for conditions other than malignant neoplasm: Secondary | ICD-10-CM | POA: Diagnosis not present

## 2020-11-25 NOTE — Progress Notes (Signed)
Subjective:    Patient ID: Brian Newton, male    DOB: 02-15-1966, 54 y.o.   MRN: [id number] Patient is a very pleasant 54 year old Caucasian male here today for complete physical exam.  His last colonoscopy was in 2019 and is due again in 2025.  PSA was recently checked on his lab work and was normal.  Therefore his cancer screening is up-to-date.  I reviewed his immunizations and he is due for a third Covid shot/booster.  He is also due for the shingles vaccine.  However the remainder of his immunizations are up-to-date.  My biggest concern is his blood pressure today which is elevated at 150/94.  He states that this is whitecoat syndrome as he checks his blood pressure at home and it is much better.  I encouraged him to check his blood pressure twice a day and record the values so that I can see them.  If his blood pressures consistently greater than 140/90, I would add additional medication to control his blood pressure.  His most recent lab work is listed below and is excellent. Lab on 11/18/2020  Component Date Value Ref Range Status  . Cholesterol 11/18/2020 169  <200 mg/dL Final  . HDL 11/18/2020 57  > OR = 40 mg/dL Final  . Triglycerides 11/18/2020 83  <150 mg/dL Final  . LDL Cholesterol (Calc) 11/18/2020 95  mg/dL (calc) Final   Comment: Reference range: <100 . Desirable range <100 mg/dL for primary prevention;   <70 mg/dL for patients with CHD or diabetic patients  with > or = 2 CHD risk factors. Marland Kitchen LDL-C is now calculated using the Martin-Hopkins  calculation, which is a validated novel method providing  better accuracy than the Friedewald equation in the  estimation of LDL-C.  Cresenciano Genre et al. Annamaria Helling. 4132;440(10): 2061-2068  (http://education.[facility].com/faq/FAQ164)   . Total CHOL/HDL Ratio 11/18/2020 3.0  <5.0 (calc) Final  . Non-HDL Cholesterol (Calc) 11/18/2020 112  <130 mg/dL (calc) Final   Comment: For patients with diabetes plus 1 major ASCVD risk  factor,  treating to a non-HDL-C goal of <100 mg/dL  (LDL-C of <70 mg/dL) is considered a therapeutic  option.   . WBC 11/18/2020 6.4  3.8 - 10.8 Thousand/uL Final  . RBC 11/18/2020 5.03  4.20 - 5.80 Million/uL Final  . Hemoglobin 11/18/2020 15.4  13.2 - 17.1 g/dL Final  . HCT 11/18/2020 46.8  38 - 50 % Final  . MCV 11/18/2020 93.0  80.0 - 100.0 fL Final  . MCH 11/18/2020 30.6  27.0 - 33.0 pg Final  . MCHC 11/18/2020 32.9  32.0 - 36.0 g/dL Final  . RDW 11/18/2020 13.3  11.0 - 15.0 % Final  . Platelets 11/18/2020 353  140 - 400 Thousand/uL Final  . MPV 11/18/2020 10.0  7.5 - 12.5 fL Final  . Neutro Abs 11/18/2020 3,322  1,500 - 7,800 cells/uL Final  . Lymphs Abs 11/18/2020 2,336  850 - 3,900 cells/uL Final  . Absolute Monocytes 11/18/2020 512  200 - 950 cells/uL Final  . Eosinophils Absolute 11/18/2020 211  15.0 - 500.0 cells/uL Final  . Basophils Absolute 11/18/2020 19  0.0 - 200.0 cells/uL Final  . Neutrophils Relative % 11/18/2020 51.9  % Final  . Total Lymphocyte 11/18/2020 36.5  % Final  . Monocytes Relative 11/18/2020 8.0  % Final  . Eosinophils Relative 11/18/2020 3.3  % Final  . Basophils Relative 11/18/2020 0.3  % Final  . Glucose, Bld 11/18/2020 84  65 - 99 mg/dL Final  Comment: .            Fasting reference interval .   . BUN 11/18/2020 15  7 - 25 mg/dL Final  . Creat 11/18/2020 1.00  0.70 - 1.33 mg/dL Final   Comment: For patients >67 years of age, the reference limit for Creatinine is approximately 13% higher for people identified as African-American. .   . GFR, Est Non African American 11/18/2020 84  > OR = 60 mL/min/1.44m2 Final  . GFR, Est African American 11/18/2020 97  > OR = 60 mL/min/1.23m2 Final  . BUN/Creatinine Ratio 23/53/6144 NOT APPLICABLE  6 - 22 (calc) Final  . Sodium 11/18/2020 140  135 - 146 mmol/L Final  . Potassium 11/18/2020 4.5  3.5 - 5.3 mmol/L Final  . Chloride 11/18/2020 103  98 - 110 mmol/L Final  . CO2 11/18/2020 29  20 - 32 mmol/L Final  .  Calcium 11/18/2020 9.3  8.6 - 10.3 mg/dL Final  . Total Protein 11/18/2020 6.2  6.1 - 8.1 g/dL Final  . Albumin 11/18/2020 3.9  3.6 - 5.1 g/dL Final  . Globulin 11/18/2020 2.3  1.9 - 3.7 g/dL (calc) Final  . AG Ratio 11/18/2020 1.7  1.0 - 2.5 (calc) Final  . Total Bilirubin 11/18/2020 0.4  0.2 - 1.2 mg/dL Final  . Alkaline phosphatase (APISO) 11/18/2020 84  35 - 144 U/L Final  . AST 11/18/2020 23  10 - 35 U/L Final  . ALT 11/18/2020 25  9 - 46 U/L Final  . PSA 11/18/2020 0.45  < OR = 4.0 ng/mL Final   Comment: The total PSA value from this assay system is  standardized against the WHO standard. The test  result will be approximately 20% lower when compared  to the equimolar-standardized total PSA (Beckman  Coulter). Comparison of serial PSA results should be  interpreted with this fact in mind. . This test was performed using the Siemens  chemiluminescent method. Values obtained from  different assay methods cannot be used interchangeably. PSA levels, regardless of value, should not be interpreted as absolute evidence of the presence or absence of disease.   . Vitamin B-12 11/18/2020 887  200 - 1,100 pg/mL Final  . Iron 11/18/2020 107  50 - 180 mcg/dL Final  . TIBC 11/18/2020 324  250 - 425 mcg/dL (calc) Final  . %SAT 11/18/2020 33  20 - 48 % (calc) Final  . Ferritin 11/18/2020 36* 38 - 380 ng/mL Final  . Vit D, 25-Hydroxy 11/18/2020 63  30 - 100 ng/mL Final   Comment: Vitamin D Status         25-OH Vitamin D: . Deficiency:                    <20 ng/mL Insufficiency:             20 - 29 ng/mL Optimal:                 > or = 30 ng/mL . For 25-OH Vitamin D testing on patients on  D2-supplementation and patients for whom quantitation  of D2 and D3 fractions is required, the [facility]ureD(TM) 25-OH VIT D, (D2,D3), LC/MS/MS is recommended: order  code [id number] (patients >52yrs). See Note 1 . Note 1 . For additional information, please refer to   http://education.[facility].com/faq/FAQ199  (This link is being provided for informational/ educational purposes only.)     Past Medical History:  Diagnosis Date  . Arthritis   . Borderline hyperglycemia   .  Headache(784.0)   . Hypercholesteremia   . Hypertension   . Metabolic syndrome   . Shingles   . Sleep apnea    uses C PAP  . Swelling    both legs  . 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 Wrist pain    Current Outpatient Medications on File Prior to Visit  Medication Sig Dispense Refill  . amLODipine-benazepril (LOTREL) 5-20 MG capsule TAKE (2) CAPSULES BY MOUTH ONCE EVERY MORNING. 60 capsule 3  . butalbital-acetaminophen-caffeine (FIORICET) 50-325-40 MG tablet TAKE 1 OR 2 TABLETS BY MOUTH EVERY SIX HOURS AS NEEDED FOR HEADACHE. 20 tablet 0  . clotrimazole-betamethasone (LOTRISONE) cream APPLY TO THE AFFECTED AREA TOPICALLY TWICE DAILY. 45 g 2  . Cyanocobalamin 500 MCG/0.1ML SOLN Place 500 mcg into the nose once a week.    . cyclobenzaprine (FLEXERIL) 10 MG tablet TAKE 1 TABLET BY MOUTH 3 TIMES DAILY AS NEEDED. 30 tablet 0  . diclofenac (VOLTAREN) 75 MG EC tablet TAKE ONE TABLET BY MOUTH TWICE DAILY. 60 tablet 5  . ferrous sulfate 325 (65 FE) MG tablet Take 325 mg by mouth daily with breakfast.    . loratadine (CLARITIN) 10 MG tablet Take 10 mg by mouth every morning.    . Multiple Vitamins-Minerals (MULTIVITAMIN WITH MINERALS) tablet Take 1 tablet by mouth every morning.     Marland Kitchen OVER THE COUNTER MEDICATION Calcium - 600mg  - vitamin d 1500units - magnesium - 120mg  - 2 tablets bid    . OVER THE COUNTER MEDICATION Iron - 54mg  - vitamn c 90mg  - 1 qam - 2 pm     No current facility-administered medications on file prior to visit.   Allergies  Allergen Reactions  . Penicillins Rash   Social History   Socioeconomic History  . Marital status: Married    Spouse name: Not on file  . Number of children: 2  . Years of education: HS  . Highest  education level: Not on file  Occupational History  . Not on file  Tobacco Use  . Smoking status: Never Smoker  . Smokeless tobacco: Never Used  Substance and Sexual Activity  . Alcohol use: No    Alcohol/week: 0.0 standard drinks  . Drug use: No  . Sexual activity: Yes  Other Topics Concern  . Not on file  Social History Narrative   Denies caffeine use    Social Determinants of Health   Financial Resource Strain:   . Difficulty of Paying Living Expenses: Not on file  Food Insecurity:   . Worried About Charity fundraiser in the Last Year: Not on file  . Ran Out of Food in the Last Year: Not on file  Transportation Needs:   . Lack of Transportation (Medical): Not on file  . Lack of Transportation (Non-Medical): Not on file  Physical Activity:   . Days of Exercise per Week: Not on file  . Minutes of Exercise per Session: Not on file  Stress:   . Feeling of Stress : Not on file  Social Connections:   . Frequency of Communication with Friends and Family: Not on file  . Frequency of Social Gatherings with Friends and Family: Not on file  . Attends Religious Services: Not on file  . Active Member of Clubs or Organizations: Not on file  . Attends Archivist Meetings: Not on file  . Marital Status: Not on file  Intimate Partner Violence:   . Fear of Current or Ex-Partner: Not on file  .  Emotionally Abused: Not on file  . Physically Abused: Not on file  . Sexually Abused: Not on file      Review of Systems  All other systems reviewed and are negative.      Objective:   Physical Exam Vitals reviewed.  Constitutional:      General: He is not in acute distress.    Appearance: He is well-developed. He is not diaphoretic.  HENT:     Head: Normocephalic and atraumatic.     Right Ear: External ear normal.     Left Ear: External ear normal.     Nose: Nose normal.     Mouth/Throat:     Pharynx: No oropharyngeal exudate.  Eyes:     General: No scleral  icterus.       Right eye: No discharge.        Left eye: No discharge.     Conjunctiva/sclera: Conjunctivae normal.     Pupils: Pupils are equal, round, and reactive to light.  Neck:     Thyroid: No thyromegaly.     Vascular: No JVD.     Trachea: No tracheal deviation.  Cardiovascular:     Rate and Rhythm: Normal rate and regular rhythm.     Heart sounds: Normal heart sounds. No murmur heard.  No friction rub. No gallop.   Pulmonary:     Effort: Pulmonary effort is normal. No respiratory distress.     Breath sounds: Normal breath sounds. No wheezing or rales.  Chest:     Chest wall: No tenderness.  Abdominal:     General: Bowel sounds are normal. There is no distension.     Palpations: Abdomen is soft. There is no mass.     Tenderness: There is no abdominal tenderness. There is no guarding or rebound.  Musculoskeletal:        General: No tenderness or deformity. Normal range of motion.     Cervical back: Neck supple.  Lymphadenopathy:     Cervical: No cervical adenopathy.  Skin:    General: Skin is warm.     Coloration: Skin is not pale.     Findings: No erythema or rash.  Neurological:     Mental Status: He is alert and oriented to person, place, and time.     Cranial Nerves: No cranial nerve deficit.     Motor: No abnormal muscle tone.     Coordination: Coordination normal.     Deep Tendon Reflexes: Reflexes are normal and symmetric.  Psychiatric:        Behavior: Behavior normal.        Thought Content: Thought content normal.        Judgment: Judgment normal.         Assessment & Plan:  General medical exam  Morbid obesity ([place])  Metabolic syndrome  Essential hypertension  Patient appears to have whitecoat syndrome.  He will check his blood pressure twice a day and record the values for me to review in 1 week.  Recommended shingles vaccine along with a Covid booster.  Colonoscopy is up-to-date.  PSA is excellent.  Lab work is outstanding.  Recommended diet  exercise and weight loss.  Otherwise the remainder of his preventative care is up-to-date.  Metabolic syndrome seems to be well managed and controlled after his dietary changes and weight loss.  However patient's BMI is still elevated and he would benefit from additional weight loss.

## 2020-11-28 ENCOUNTER — Encounter: Payer: Self-pay | Admitting: Family Medicine

## 2020-11-28 ENCOUNTER — Other Ambulatory Visit: Payer: Self-pay | Admitting: Family Medicine

## 2020-11-28 MED ORDER — HYDROCHLOROTHIAZIDE 25 MG PO TABS: 25.0000 mg | ORAL_TABLET | Freq: Every day | ORAL | 3 refills | Status: DC

## 2020-12-22 ENCOUNTER — Other Ambulatory Visit: Payer: Self-pay | Admitting: Family Medicine

## 2021-02-24 ENCOUNTER — Other Ambulatory Visit: Payer: Self-pay | Admitting: Family Medicine

## 2021-03-24 ENCOUNTER — Other Ambulatory Visit: Payer: Self-pay | Admitting: Family Medicine

## 2021-04-23 ENCOUNTER — Other Ambulatory Visit: Payer: Self-pay | Admitting: Family Medicine

## 2021-05-28 ENCOUNTER — Other Ambulatory Visit: Payer: Self-pay | Admitting: Family Medicine

## 2021-06-15 ENCOUNTER — Encounter: Payer: Self-pay | Admitting: Family Medicine

## 2021-06-17 ENCOUNTER — Other Ambulatory Visit: Payer: Self-pay | Admitting: Family Medicine

## 2021-06-29 ENCOUNTER — Ambulatory Visit: Payer: Federal, State, Local not specified - PPO | Admitting: Family Medicine

## 2021-06-29 ENCOUNTER — Encounter: Payer: Self-pay | Admitting: Family Medicine

## 2021-06-29 ENCOUNTER — Other Ambulatory Visit: Payer: Self-pay

## 2021-06-29 VITALS — BP 140/86 | HR 66 | Temp 98.6°F | Ht 65.0 in | Wt 248.8 lb

## 2021-06-29 DIAGNOSIS — E7800 Pure hypercholesterolemia, unspecified: Secondary | ICD-10-CM

## 2021-06-29 DIAGNOSIS — I10 Essential (primary) hypertension: Secondary | ICD-10-CM | POA: Diagnosis not present

## 2021-06-29 DIAGNOSIS — E8881 Metabolic syndrome: Secondary | ICD-10-CM | POA: Diagnosis not present

## 2021-06-29 NOTE — Progress Notes (Signed)
Subjective:    Patient ID: Brian Newton, male    DOB: Oct 10, 1966, 55 y.o.   MRN: [id number]   Here today to recheck his blood pressure.  Blood pressure at home is well controlled.  He denies any chest pain shortness of breath or dyspnea on exertion.  He has a history of metabolic syndrome with hyperglycemia in the past.  This improved with dramatic weight loss after gastric bypass surgery.  However he is due to recheck this along with his electrolytes.  He denies any polyuria polydipsia or blurry vision.  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 60 capsule 0   butalbital-acetaminophen-caffeine (FIORICET) 50-325-40 MG tablet TAKE 1 OR 2 TABLETS BY MOUTH EVERY SIX HOURS AS NEEDED FOR HEADACHE. 20 tablet 0   clotrimazole-betamethasone (LOTRISONE) cream APPLY TO THE AFFECTED AREA TOPICALLY TWICE DAILY. 45 g 2   Cyanocobalamin 500 MCG/0.1ML SOLN Place 500 mcg into the nose once a week.     cyclobenzaprine (FLEXERIL) 10 MG tablet TAKE 1 TABLET BY MOUTH 3 TIMES DAILY AS NEEDED. 30 tablet 0   diclofenac (VOLTAREN) 75 MG EC tablet TAKE ONE TABLET BY MOUTH TWICE DAILY. 60 tablet 5   ferrous sulfate 325 (65 FE) MG tablet Take 325 mg by mouth daily with breakfast.     hydrochlorothiazide (HYDRODIURIL) 25 MG tablet Take 1 tablet (25 mg total) by mouth daily. 90 tablet 3   loratadine (CLARITIN) 10 MG tablet Take 10 mg by mouth every morning.     Multiple Vitamins-Minerals  (MULTIVITAMIN WITH MINERALS) tablet Take 1 tablet by mouth every morning.      OVER THE COUNTER MEDICATION Calcium - 600mg  - vitamin d 1500units - magnesium - 120mg  - 2 tablets bid     OVER THE COUNTER MEDICATION Iron - 54mg  - vitamn c 90mg  - 1 qam - 2 pm     No current facility-administered medications on file prior to visit.   Allergies  Allergen Reactions   Penicillins Rash   Social History   Socioeconomic History   Marital status: Married    Spouse name: Not on file   Number of children: 2   Years of education: HS   Highest education level: Not on file  Occupational History   Not on file  Tobacco Use   Smoking status: Never   Smokeless tobacco: Never  Substance and Sexual Activity   Alcohol use: No    Alcohol/week: 0.0 standard drinks   Drug use: No   Sexual activity: Yes  Other Topics Concern   Not on file  Social History Narrative   Denies caffeine use    Social Determinants of Health   Financial Resource Strain: Not on file  Food Insecurity: Not on file  Transportation Needs: Not on file  Physical Activity: Not on file  Stress: Not on file  Social Connections: Not on file  Intimate Partner  Violence: Not on file      Review of Systems  All other systems reviewed and are negative.     Objective:   Physical Exam Vitals reviewed.  Constitutional:      General: He is not in acute distress.    Appearance: He is well-developed. He is not diaphoretic.  Cardiovascular:     Rate and Rhythm: Normal rate and regular rhythm.     Heart sounds: Normal heart sounds. No murmur heard.   No friction rub. No gallop.  Pulmonary:     Effort: Pulmonary effort is normal. No respiratory distress.     Breath sounds: Normal breath sounds. No wheezing or rales.  Neurological:     Mental Status: He is alert and oriented to person, place, and time.     Cranial Nerves: No cranial nerve deficit.     Motor: No abnormal muscle tone.     Coordination: Coordination normal.         Assessment & Plan:  Metabolic syndrome - Plan: CBC with Differential/Platelet, COMPLETE METABOLIC PANEL WITH GFR, Lipid panel, Hemoglobin A1c  Essential hypertension - Plan: CBC with Differential/Platelet, COMPLETE METABOLIC PANEL WITH GFR, Lipid panel, Hemoglobin A1c  Hypercholesteremia - Plan: CBC with Differential/Platelet, COMPLETE METABOLIC PANEL WITH GFR, Lipid panel, Hemoglobin A1c Blood pressures acceptable.  Check CBC CMP fasting lipid panel and hemoglobin A1c.  If lab work is stable, I think we can space the patient to once a year checkups.  His yearly physical is due in December

## 2021-06-30 LAB — CBC WITH DIFFERENTIAL/PLATELET
Absolute Monocytes: 589 cells/uL (ref 200–950)
Basophils Absolute: 7 cells/uL (ref 0–200)
Basophils Relative: 0.1 %
Eosinophils Absolute: 199 cells/uL (ref 15–500)
Eosinophils Relative: 2.8 %
HCT: 45.2 % (ref 38.5–50.0)
Hemoglobin: 14.8 g/dL (ref 13.2–17.1)
Lymphs Abs: 2400 cells/uL (ref 850–3900)
MCH: 30.4 pg (ref 27.0–33.0)
MCHC: 32.7 g/dL (ref 32.0–36.0)
MCV: 92.8 fL (ref 80.0–100.0)
MPV: 10.2 fL (ref 7.5–12.5)
Monocytes Relative: 8.3 %
Neutro Abs: 3905 cells/uL (ref 1500–7800)
Neutrophils Relative %: 55 %
Platelets: 337 10*3/uL (ref 140–400)
RBC: 4.87 10*6/uL (ref 4.20–5.80)
RDW: 13 % (ref 11.0–15.0)
Total Lymphocyte: 33.8 %
WBC: 7.1 10*3/uL (ref 3.8–10.8)

## 2021-06-30 LAB — LIPID PANEL
Cholesterol: 164 mg/dL (ref <200)
HDL: 46 mg/dL (ref >=40)
LDL Cholesterol (Calc): 99 mg/dL (calc)
Non-HDL Cholesterol (Calc): 118 mg/dL (calc) (ref <130)
Total CHOL/HDL Ratio: 3.6 (calc) (ref <5.0)
Triglycerides: 96 mg/dL (ref <150)

## 2021-06-30 LAB — COMPLETE METABOLIC PANEL WITH GFR
AG Ratio: 2 (calc) (ref 1.0–2.5)
ALT: 19 U/L (ref 9–46)
AST: 20 U/L (ref 10–35)
Albumin: 4.2 g/dL (ref 3.6–5.1)
Alkaline phosphatase (APISO): 69 U/L (ref 35–144)
BUN: 18 mg/dL (ref 7–25)
CO2: 30 mmol/L (ref 20–32)
Calcium: 9.4 mg/dL (ref 8.6–10.3)
Chloride: 102 mmol/L (ref 98–110)
Creat: 1.07 mg/dL (ref 0.70–1.30)
Globulin: 2.1 g/dL (calc) (ref 1.9–3.7)
Glucose, Bld: 97 mg/dL (ref 65–99)
Potassium: 4 mmol/L (ref 3.5–5.3)
Sodium: 138 mmol/L (ref 135–146)
Total Bilirubin: 0.4 mg/dL (ref 0.2–1.2)
Total Protein: 6.3 g/dL (ref 6.1–8.1)
eGFR: 81 mL/min/{1.73_m2} (ref >=60)

## 2021-06-30 LAB — HEMOGLOBIN A1C
Hgb A1c MFr Bld: 5.6 % of total Hgb (ref <5.7)
Mean Plasma Glucose: 114 mg/dL
eAG (mmol/L): 6.3 mmol/L

## 2021-07-31 ENCOUNTER — Other Ambulatory Visit: Payer: Self-pay | Admitting: Family Medicine

## 2021-08-03 ENCOUNTER — Telehealth: Payer: Federal, State, Local not specified - PPO | Admitting: Adult Health

## 2021-08-04 ENCOUNTER — Other Ambulatory Visit: Payer: Self-pay | Admitting: Family Medicine

## 2021-08-04 NOTE — Telephone Encounter (Signed)
Ok to refill 

## 2021-09-01 ENCOUNTER — Telehealth: Payer: Federal, State, Local not specified - PPO | Admitting: Adult Health

## 2021-09-01 DIAGNOSIS — G4733 Obstructive sleep apnea (adult) (pediatric): Secondary | ICD-10-CM | POA: Diagnosis not present

## 2021-09-01 DIAGNOSIS — Z9989 Dependence on other enabling machines and devices: Secondary | ICD-10-CM | POA: Diagnosis not present

## 2021-09-01 NOTE — Progress Notes (Addendum)
PATIENT: Brian Newton DOB: June 29, 1966  REASON FOR VISIT: follow up HISTORY FROM: patient PRIMARY NEUROLOGIST:   Virtual Visit via Video Note  I connected with Brian Newton on 09/01/21 at  1:15 PM EDT by a video enabled telemedicine application located remotely at [facility] and verified that I am speaking with the correct person using two identifiers who was located at their own home.   I discussed the limitations of evaluation and management by telemedicine and the availability of in person appointments. The patient expressed understanding and agreed to proceed.   PATIENT: Brian Newton DOB: [birth year]  REASON FOR VISIT: follow up HISTORY FROM: patient  HISTORY OF PRESENT ILLNESS: Today 09/01/21:  Brian Newton is a 55 year old male with a history of obstructive sleep apnea on CPAP.  He returns today for follow-up.  He reports that the CPAP is working well for him.  He is currently on vacation he reports that when he was on vacation he takes an old machine with him.  He states that he does not miss a night using his CPAP machine.     REVIEW OF SYSTEMS: Out of a complete 14 system review of symptoms, the patient complains only of the following symptoms, and all other reviewed systems are negative.  ALLERGIES: Allergies  Allergen Reactions   Penicillins Rash    HOME MEDICATIONS: Outpatient Medications Prior to Visit  Medication Sig Dispense Refill   amLODipine-benazepril (LOTREL) 5-20 MG capsule TAKE (2) CAPSULES BY MOUTH ONCE EVERY MORNING. 60 capsule 0   butalbital-acetaminophen-caffeine (FIORICET) 50-325-40 MG tablet TAKE 1 OR 2 TABLETS BY MOUTH EVERY SIX HOURS AS NEEDED FOR HEADACHE. 20 tablet 0   clotrimazole-betamethasone (LOTRISONE) cream APPLY TO THE AFFECTED AREA TOPICALLY TWICE DAILY. 45 g 2   Cyanocobalamin 500 MCG/0.1ML SOLN Place 500 mcg into the nose once a week.     cyclobenzaprine (FLEXERIL) 10 MG tablet TAKE 1 TABLET BY  MOUTH 3 TIMES DAILY AS NEEDED. 30 tablet 0   diclofenac (VOLTAREN) 75 MG EC tablet TAKE ONE TABLET BY MOUTH TWICE DAILY. 60 tablet 5   ferrous sulfate 325 (65 FE) MG tablet Take 325 mg by mouth daily with breakfast.     hydrochlorothiazide (HYDRODIURIL) 25 MG tablet Take 1 tablet (25 mg total) by mouth daily. 90 tablet 3   loratadine (CLARITIN) 10 MG tablet Take 10 mg by mouth every morning.     Multiple Vitamins-Minerals (MULTIVITAMIN WITH MINERALS) tablet Take 1 tablet by mouth every morning.      OVER THE COUNTER MEDICATION Calcium - '600mg'$  - vitamin d 1500units - magnesium - '120mg'$  - 2 tablets bid     OVER THE COUNTER MEDICATION Iron - '54mg'$  - vitamn c '90mg'$  - 1 qam - 2 pm     No facility-administered medications prior to visit.    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)    Wrist pain     PAST SURGICAL HISTORY: Past Surgical History:  Procedure Laterality Date   APPENDECTOMY  1986   BREATH TEK H PYLORI N/A 08/08/2014   Procedure: BREATH TEK H PYLORI;  Surgeon: Edward Jolly, MD;  Location: [facility];  Service: General;  Laterality: N/A;   COLONOSCOPY N/A 03/02/2013   Procedure: COLONOSCOPY;  Surgeon: Beryle Beams, MD;  Location: [facility] ENDOSCOPY;  Service: Endoscopy;  Laterality: N/A;   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   Procedure: LAPAROSCOPIC ROUX-EN-Y GASTRIC BYPASS WITH UPPER ENDOSCOPY;  Surgeon: Excell Seltzer, MD;  Location: [facility] ORS;  Service: General;  Laterality: N/A;   KNEE ARTHROSCOPY  2001   rt knee   UPPER GI ENDOSCOPY  11/05/2014   Procedure: UPPER GI ENDOSCOPY;  Surgeon: Excell Seltzer, MD;  Location: [facility] ORS;  Service: General;;    FAMILY HISTORY: Family History  Problem Relation Age of Onset    Hypertension Mother    Hypertension Other    Hyperlipidemia Other    Heart disease Other    Obesity Other    Sleep apnea Other    Breast cancer Maternal Grandmother    Colon cancer Maternal Grandfather     SOCIAL HISTORY: Social History   Socioeconomic History   Marital status: Married    Spouse name: Not on file   Number of children: 2   Years of education: HS   Highest education level: Not on file  Occupational History   Not on file  Tobacco Use   Smoking status: Never   Smokeless tobacco: Never  Substance and Sexual Activity   Alcohol use: No    Alcohol/week: 0.0 standard drinks   Drug use: No   Sexual activity: Yes  Other Topics Concern   Not on file  Social History Narrative   Denies caffeine use    Social Determinants of Health   Financial Resource Strain: Not on file  Food Insecurity: Not on file  Transportation Needs: Not on file  Physical Activity: Not on file  Stress: Not on file  Social Connections: Not on file  Intimate Partner Violence: Not on file      PHYSICAL EXAM Generalized: Well developed, in no acute distress   Neurological examination  Mentation: Alert oriented to time, place, history taking. Follows all commands speech and language fluent Cranial nerve II-XII:Extraocular movements were full. Facial symmetry noted. uvula tongue midline. Head turning and shoulder shrug  were normal and symmetric. Motor: Good strength throughout subjectively per patient Sensory: Sensory testing is intact to soft touch on all 4 extremities subjectively per patient Coordination: Cerebellar testing reveals good finger-nose-finger  Gait and station: Patient is able to stand from a seated position. gait is normal.  Reflexes: UTA  DIAGNOSTIC DATA (LABS, IMAGING, TESTING) - I reviewed patient records, labs, notes, testing and imaging myself where available.  Lab Results  Component Value Date   WBC 7.1 06/29/2021   HGB 14.8 06/29/2021   HCT 45.2  06/29/2021   MCV 92.8 06/29/2021   PLT 337 06/29/2021      Component Value Date/Time   NA 138 06/29/2021 0839   K 4.0 06/29/2021 0839   CL 102 06/29/2021 0839   CO2 30 06/29/2021 0839   GLUCOSE 97 06/29/2021 0839   BUN 18 06/29/2021 0839   CREATININE 1.07 06/29/2021 0839   CALCIUM 9.4 06/29/2021 0839   PROT 6.3 06/29/2021 0839   ALBUMIN 4.0 04/26/2017 0829   AST 20 06/29/2021 0839   ALT 19 06/29/2021 0839   ALKPHOS 77 04/26/2017 0829   BILITOT 0.4 06/29/2021 0839   GFRNONAA 84 11/18/2020 0809   GFRAA 97 11/18/2020 0809   Lab Results  Component Value Date   CHOL 164 06/29/2021   HDL 46 06/29/2021   LDLCALC 99  06/29/2021   TRIG 96 06/29/2021   CHOLHDL 3.6 06/29/2021   Lab Results  Component Value Date   HGBA1C 5.6 06/29/2021   Lab Results  Component Value Date   [id number] 11/18/2020   Lab Results  Component Value Date   TSH 1.17 04/26/2017      ASSESSMENT AND PLAN 55 y.o. 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 here with:  OSA on CPAP  CPAP compliance excellent Residual AHI is good Encouraged patient to continue using CPAP nightly and > 4 hours each night F/U in 1 year or sooner if needed    Ward Givens, MSN, NP-C 09/01/2021, 1:20 PM [facility] [address], [place], [place] [address] [phone]  I reviewed the above note and documentation by the Nurse Practitioner and agree with the history, exam, assessment and plan as outlined above. I was available for consultation. Star Age, MD, PhD [facility])

## 2021-09-11 ENCOUNTER — Other Ambulatory Visit: Payer: Self-pay | Admitting: Family Medicine

## 2021-09-16 DIAGNOSIS — K136 Irritative hyperplasia of oral mucosa: Secondary | ICD-10-CM | POA: Diagnosis not present

## 2021-09-16 DIAGNOSIS — K080 Exfoliation of teeth due to systemic causes: Secondary | ICD-10-CM | POA: Diagnosis not present

## 2021-10-07 ENCOUNTER — Other Ambulatory Visit: Payer: Self-pay | Admitting: Family Medicine

## 2021-11-03 ENCOUNTER — Other Ambulatory Visit: Payer: Self-pay | Admitting: Family Medicine

## 2021-11-23 ENCOUNTER — Other Ambulatory Visit: Payer: Self-pay

## 2021-11-23 ENCOUNTER — Other Ambulatory Visit: Payer: Federal, State, Local not specified - PPO

## 2021-11-23 DIAGNOSIS — E8881 Metabolic syndrome: Secondary | ICD-10-CM

## 2021-11-23 DIAGNOSIS — I10 Essential (primary) hypertension: Secondary | ICD-10-CM | POA: Diagnosis not present

## 2021-11-23 DIAGNOSIS — Z0000 Encounter for general adult medical examination without abnormal findings: Secondary | ICD-10-CM

## 2021-11-23 DIAGNOSIS — E7800 Pure hypercholesterolemia, unspecified: Secondary | ICD-10-CM

## 2021-11-27 ENCOUNTER — Other Ambulatory Visit: Payer: Self-pay

## 2021-11-27 ENCOUNTER — Ambulatory Visit (INDEPENDENT_AMBULATORY_CARE_PROVIDER_SITE_OTHER): Payer: Federal, State, Local not specified - PPO | Admitting: Family Medicine

## 2021-11-27 ENCOUNTER — Encounter: Payer: Self-pay | Admitting: Family Medicine

## 2021-11-27 VITALS — BP 140/90 | HR 68 | Temp 97.7°F | Ht 65.0 in | Wt 251.8 lb

## 2021-11-27 DIAGNOSIS — I10 Essential (primary) hypertension: Secondary | ICD-10-CM | POA: Diagnosis not present

## 2021-11-27 DIAGNOSIS — Z860100 Personal history of colon polyps, unspecified: Secondary | ICD-10-CM

## 2021-11-27 DIAGNOSIS — E8881 Metabolic syndrome: Secondary | ICD-10-CM

## 2021-11-27 DIAGNOSIS — Z0000 Encounter for general adult medical examination without abnormal findings: Secondary | ICD-10-CM

## 2021-11-27 DIAGNOSIS — E7800 Pure hypercholesterolemia, unspecified: Secondary | ICD-10-CM

## 2021-11-27 DIAGNOSIS — L570 Actinic keratosis: Secondary | ICD-10-CM

## 2021-11-27 NOTE — Progress Notes (Signed)
Subjective:    Patient ID: Brian Newton, male    DOB: [birth year], 55 y.o.   MRN: [id number] Patient is a very pleasant 55 year old Caucasian male here today for complete physical exam.  His last colonoscopy was in 2019 and is due again in 2025.  His PSA is still pending and has not resulted back of his most recent lab work however he denies any urinary hesitancy or urgency or frequency or hematuria.  I reviewed his immunizations with him.  His flu shot, by Valent COVID-vaccine, and Pneumovax 23 are all up-to-date.  He is yet to have a shingles vaccine. Appointment on 11/23/2021  Component Date Value Ref Range Status   Vitamin B-12 11/23/2021 1,228 (H)  200 - 1,100 pg/mL Final   Vit D, 25-Hydroxy 11/23/2021 65  30 - 100 ng/mL Final   Comment: Vitamin D Status         25-OH Vitamin D: . Deficiency:                    <20 ng/mL Insufficiency:             20 - 29 ng/mL Optimal:                 > or = 30 ng/mL . For 25-OH Vitamin D testing on patients on  D2-supplementation and patients for whom quantitation  of D2 and D3 fractions is required, the QuestAssureD(TM) 25-OH VIT D, (D2,D3), LC/MS/MS is recommended: order  code [id number] (patients >28yrs). See Note 1 . Note 1 . For additional information, please refer to  http://education.[facility].com/faq/FAQ199  (This link is being provided for informational/ educational purposes only.)    Hgb A1c MFr Bld 11/23/2021 5.7 (H)  <5.7 % of total Hgb Final   Comment: For someone without known diabetes, a hemoglobin  A1c value between 5.7% and 6.4% is consistent with prediabetes and should be confirmed with a  follow-up test. . For someone with known diabetes, a value <7% indicates that their diabetes is well controlled. A1c targets should be individualized based on duration of diabetes, age, comorbid conditions, and other considerations. . This assay result is consistent with an increased risk of diabetes. . Currently, no  consensus exists regarding use of hemoglobin A1c for diagnosis of diabetes for children. .    Mean Plasma Glucose 11/23/2021 117  mg/dL Final   eAG (mmol/L) 11/23/2021 6.5  mmol/L Final   WBC 11/23/2021 7.4  3.8 - 10.8 Thousand/uL Final   RBC 11/23/2021 5.00  4.20 - 5.80 Million/uL Final   Hemoglobin 11/23/2021 15.4  13.2 - 17.1 g/dL Final   HCT 11/23/2021 45.9  38.5 - 50.0 % Final   MCV 11/23/2021 91.8  80.0 - 100.0 fL Final   MCH 11/23/2021 30.8  27.0 - 33.0 pg Final   MCHC 11/23/2021 33.6  32.0 - 36.0 g/dL Final   RDW 11/23/2021 12.7  11.0 - 15.0 % Final   Platelets 11/23/2021 351  140 - 400 Thousand/uL Final   MPV 11/23/2021 10.0  7.5 - 12.5 fL Final   Neutro Abs 11/23/2021 3,781  1,500 - 7,800 cells/uL Final   Lymphs Abs 11/23/2021 2,701  850 - 3,900 cells/uL Final   Absolute Monocytes 11/23/2021 651  200 - 950 cells/uL Final   Eosinophils Absolute 11/23/2021 229  15 - 500 cells/uL Final   Basophils Absolute 11/23/2021 37  0 - 200 cells/uL Final   Neutrophils Relative % 11/23/2021 51.1  % Final   Total Lymphocyte  11/23/2021 36.5  % Final   Monocytes Relative 11/23/2021 8.8  % Final   Eosinophils Relative 11/23/2021 3.1  % Final   Basophils Relative 11/23/2021 0.5  % Final   Glucose, Bld 11/23/2021 93  65 - 99 mg/dL Final   Comment: .            Fasting reference interval .    BUN 11/23/2021 16  7 - 25 mg/dL Final   Creat 11/23/2021 1.03  0.70 - 1.30 mg/dL Final   BUN/Creatinine Ratio 26/71/2458 NOT APPLICABLE  6 - 22 (calc) Final   Sodium 11/23/2021 141  135 - 146 mmol/L Final   Potassium 11/23/2021 4.6  3.5 - 5.3 mmol/L Final   Chloride 11/23/2021 101  98 - 110 mmol/L Final   CO2 11/23/2021 32  20 - 32 mmol/L Final   Calcium 11/23/2021 9.4  8.6 - 10.3 mg/dL Final   Total Protein 11/23/2021 6.3  6.1 - 8.1 g/dL Final   Albumin 11/23/2021 4.1  3.6 - 5.1 g/dL Final   Globulin 11/23/2021 2.2  1.9 - 3.7 g/dL (calc) Final   AG Ratio 11/23/2021 1.9  1.0 - 2.5 (calc) Final    Total Bilirubin 11/23/2021 0.5  0.2 - 1.2 mg/dL Final   Alkaline phosphatase (APISO) 11/23/2021 69  35 - 144 U/L Final   AST 11/23/2021 20  10 - 35 U/L Final   ALT 11/23/2021 23  9 - 46 U/L Final   Cholesterol 11/23/2021 165  <200 mg/dL Final   HDL 11/23/2021 53  > OR = 40 mg/dL Final   Triglycerides 11/23/2021 84  <150 mg/dL Final   LDL Cholesterol (Calc) 11/23/2021 95  mg/dL (calc) Final   Comment: Reference range: <100 . Desirable range <100 mg/dL for primary prevention;   <70 mg/dL for patients with CHD or diabetic patients  with > or = 2 CHD risk factors. Marland Kitchen LDL-C is now calculated using the Martin-Hopkins  calculation, which is a validated novel method providing  better accuracy than the Friedewald equation in the  estimation of LDL-C.  Cresenciano Genre et al. Annamaria Helling. 0998;338(25): 2061-2068  (http://education.[facility].com/faq/FAQ164)    Total CHOL/HDL Ratio 11/23/2021 3.1  <5.0 (calc) Final   Non-HDL Cholesterol (Calc) 11/23/2021 112  <130 mg/dL (calc) Final   Comment: For patients with diabetes plus 1 major ASCVD risk  factor, treating to a non-HDL-C goal of <100 mg/dL  (LDL-C of <70 mg/dL) is considered a therapeutic  option.     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 60 capsule 0   butalbital-acetaminophen-caffeine (FIORICET) 50-325-40 MG tablet TAKE 1 OR 2 TABLETS BY MOUTH EVERY SIX HOURS AS NEEDED FOR HEADACHE. 20 tablet 0   clotrimazole-betamethasone (LOTRISONE) cream APPLY TO THE AFFECTED AREA TOPICALLY TWICE DAILY. 45 g 2    Cyanocobalamin 500 MCG/0.1ML SOLN Place 500 mcg into the nose once a week.     cyclobenzaprine (FLEXERIL) 10 MG tablet TAKE 1 TABLET BY MOUTH 3 TIMES DAILY AS NEEDED. [place]  tablet 0   diclofenac (VOLTAREN) 75 MG EC tablet TAKE ONE TABLET BY MOUTH TWICE DAILY. 60 tablet 5   ferrous sulfate 325 (65 FE) MG tablet Take 325 mg by mouth daily with breakfast.     hydrochlorothiazide (HYDRODIURIL) 25 MG tablet TAKE 1 TABLET BY MOUTH ONCE A DAY. 90 tablet 0   loratadine (CLARITIN) 10 MG tablet Take 10 mg by mouth every morning.     Multiple Vitamins-Minerals (MULTIVITAMIN WITH MINERALS) tablet Take 1 tablet by mouth every morning.      OVER THE COUNTER MEDICATION Calcium - 600mg  - vitamin d 1500units - magnesium - 120mg  - 2 tablets bid     OVER THE COUNTER MEDICATION Iron - 54mg  - vitamn c 90mg  - 1 qam - 2 pm     No current facility-administered medications on file prior to visit.   Allergies  Allergen Reactions   Penicillins Rash   Social History   Socioeconomic History   Marital status: Married    Spouse name: Not on file   Number of children: 2   Years of education: HS   Highest education level: Not on file  Occupational History   Not on file  Tobacco Use   Smoking status: Never   Smokeless tobacco: Never  Substance and Sexual Activity   Alcohol use: No    Alcohol/week: 0.0 standard drinks   Drug use: No   Sexual activity: Yes  Other Topics Concern   Not on file  Social History Narrative   Denies caffeine use    Social Determinants of Health   Financial Resource Strain: Not on file  Food Insecurity: Not on file  Transportation Needs: Not on file  Physical Activity: Not on file  Stress: Not on file  Social Connections: Not on file  Intimate Partner Violence: Not on file      Review of Systems  All other systems reviewed and are negative.     Objective:   Physical Exam Vitals reviewed.  Constitutional:      General: He is not in acute distress.    Appearance: He is  well-developed. He is not diaphoretic.  HENT:     Head: Normocephalic and atraumatic.     Right Ear: External ear normal.     Left Ear: External ear normal.     Nose: Nose normal.     Mouth/Throat:     Pharynx: No oropharyngeal exudate.  Eyes:     General: No scleral icterus.       Right eye: No discharge.        Left eye: No discharge.     Conjunctiva/sclera: Conjunctivae normal.     Pupils: Pupils are equal, round, and reactive to light.  Neck:     Thyroid: No thyromegaly.     Vascular: No JVD.     Trachea: No tracheal deviation.  Cardiovascular:     Rate and Rhythm: Normal rate and regular rhythm.     Heart sounds: Normal heart sounds. No murmur heard.   No friction rub. No gallop.  Pulmonary:     Effort: Pulmonary effort is normal. No respiratory distress.     Breath sounds: Normal breath sounds. No wheezing or rales.  Chest:     Chest wall: No tenderness.  Abdominal:     General: Bowel sounds are normal. There is no distension.     Palpations: Abdomen is soft. There is no mass.     Tenderness: There is no abdominal tenderness. There is no  guarding or rebound.  Musculoskeletal:        General: No tenderness or deformity. Normal range of motion.     Cervical back: Neck supple.  Lymphadenopathy:     Cervical: No cervical adenopathy.  Skin:    General: Skin is warm.     Coloration: Skin is not pale.     Findings: No erythema or rash.  Neurological:     Mental Status: He is alert and oriented to person, place, and time.     Cranial Nerves: No cranial nerve deficit.     Motor: No abnormal muscle tone.     Coordination: Coordination normal.     Deep Tendon Reflexes: Reflexes are normal and symmetric.  Psychiatric:        Behavior: Behavior normal.        Thought Content: Thought content normal.        Judgment: Judgment normal.        Assessment & Plan:  Actinic keratosis - Plan: Ambulatory referral to Dermatology  General medical exam  Metabolic  syndrome  Hypercholesteremia  Essential hypertension  History of colonic polyps Reviewing the patient's lab work, he continues to do well having lost substantial weight.  As result he is "cured" his metabolic syndrome.  His blood pressure today is borderline.  Of asked him to check his blood pressure at home over the next 2 weeks and report the values to me to ensure that it is less than 140/90.  His colonoscopy is not due again until 2026.  I will check his PSA today and call the patient with the results on Monday.  Recommended the shingles vaccine.  Flu shot and COVID vaccination are up-to-date although they are not reported in epic he received in the local pharmacy.  He does have an erythematous scaly papule in the center of his forehead that scales and returns.  It appears to be an actinic keratosis.  I will consult dermatology regarding this.

## 2021-11-28 LAB — COMPREHENSIVE METABOLIC PANEL
AG Ratio: 1.9 (calc) (ref 1.0–2.5)
ALT: 23 U/L (ref 9–46)
AST: 20 U/L (ref 10–35)
Albumin: 4.1 g/dL (ref 3.6–5.1)
Alkaline phosphatase (APISO): 69 U/L (ref 35–144)
BUN: 16 mg/dL (ref 7–25)
CO2: 32 mmol/L (ref 20–32)
Calcium: 9.4 mg/dL (ref 8.6–10.3)
Chloride: 101 mmol/L (ref 98–110)
Creat: 1.03 mg/dL (ref 0.70–1.30)
Globulin: 2.2 g/dL (calc) (ref 1.9–3.7)
Glucose, Bld: 93 mg/dL (ref 65–99)
Potassium: 4.6 mmol/L (ref 3.5–5.3)
Sodium: 141 mmol/L (ref 135–146)
Total Bilirubin: 0.5 mg/dL (ref 0.2–1.2)
Total Protein: 6.3 g/dL (ref 6.1–8.1)

## 2021-11-28 LAB — CBC WITH DIFFERENTIAL/PLATELET
Absolute Monocytes: 651 cells/uL (ref 200–950)
Basophils Absolute: 37 cells/uL (ref 0–200)
Basophils Relative: 0.5 %
Eosinophils Absolute: 229 cells/uL (ref 15–500)
Eosinophils Relative: 3.1 %
HCT: 45.9 % (ref 38.5–50.0)
Hemoglobin: 15.4 g/dL (ref 13.2–17.1)
Lymphs Abs: 2701 cells/uL (ref 850–3900)
MCH: 30.8 pg (ref 27.0–33.0)
MCHC: 33.6 g/dL (ref 32.0–36.0)
MCV: 91.8 fL (ref 80.0–100.0)
MPV: 10 fL (ref 7.5–12.5)
Monocytes Relative: 8.8 %
Neutro Abs: 3781 cells/uL (ref 1500–7800)
Neutrophils Relative %: 51.1 %
Platelets: 351 10*3/uL (ref 140–400)
RBC: 5 10*6/uL (ref 4.20–5.80)
RDW: 12.7 % (ref 11.0–15.0)
Total Lymphocyte: 36.5 %
WBC: 7.4 10*3/uL (ref 3.8–10.8)

## 2021-11-28 LAB — TEST AUTHORIZATION

## 2021-11-28 LAB — LIPID PANEL
Cholesterol: 165 mg/dL (ref <200)
HDL: 53 mg/dL (ref >=40)
LDL Cholesterol (Calc): 95 mg/dL (calc)
Non-HDL Cholesterol (Calc): 112 mg/dL (calc) (ref <130)
Total CHOL/HDL Ratio: 3.1 (calc) (ref <5.0)
Triglycerides: 84 mg/dL (ref <150)

## 2021-11-28 LAB — HEMOGLOBIN A1C
Hgb A1c MFr Bld: 5.7 % of total Hgb — ABNORMAL HIGH (ref <5.7)
Mean Plasma Glucose: 117 mg/dL
eAG (mmol/L): 6.5 mmol/L

## 2021-11-28 LAB — PSA: PSA: 0.44 ng/mL (ref <=4.00)

## 2021-11-28 LAB — VITAMIN B12: Vitamin B-12: 1228 pg/mL — ABNORMAL HIGH (ref 200–1100)

## 2021-11-28 LAB — VITAMIN D 25 HYDROXY (VIT D DEFICIENCY, FRACTURES): Vit D, 25-Hydroxy: 65 ng/mL (ref 30–100)

## 2021-12-07 ENCOUNTER — Other Ambulatory Visit: Payer: Self-pay | Admitting: Family Medicine

## 2021-12-10 DIAGNOSIS — Z9884 Bariatric surgery status: Secondary | ICD-10-CM | POA: Diagnosis not present

## 2021-12-13 ENCOUNTER — Other Ambulatory Visit: Payer: Self-pay | Admitting: Nurse Practitioner

## 2021-12-13 DIAGNOSIS — U071 COVID-19: Secondary | ICD-10-CM

## 2021-12-13 MED ORDER — MOLNUPIRAVIR EUA 200MG CAPSULE: 4.0000 | ORAL_CAPSULE | Freq: Two times a day (BID) | ORAL | 0 refills | Status: AC

## 2021-12-13 NOTE — Progress Notes (Signed)
Received call from patient's daughter.  Reports patient with fever, sore throat, sinus pressure starting 12/23.  Tested positive for COVID-19 12/24.  Has had 3 COVID-19 vaccines and 1 booster.   Requesting guidance.   Discussed OTC treatment symptomatically with Tylenol/ibuprofen for fever reduction, guaifenesin to help with pressure/congestion, saline rinses, throat lozenges/gargling with salt water to help with sore throat.  Given higher risk for severe disease with comorbidities, anti viral indicated.  Molnupiravir sent to pharmacy.

## 2022-01-04 ENCOUNTER — Other Ambulatory Visit: Payer: Self-pay | Admitting: Family Medicine

## 2022-02-11 ENCOUNTER — Other Ambulatory Visit: Payer: Self-pay | Admitting: Family Medicine

## 2022-02-11 NOTE — Telephone Encounter (Signed)
LOV 11/27/21 Last refill 12/07/21, #20, 0 refills  Please review, thanks!

## 2022-03-02 DIAGNOSIS — K136 Irritative hyperplasia of oral mucosa: Secondary | ICD-10-CM | POA: Diagnosis not present

## 2022-03-02 DIAGNOSIS — K080 Exfoliation of teeth due to systemic causes: Secondary | ICD-10-CM | POA: Diagnosis not present

## 2022-03-15 ENCOUNTER — Ambulatory Visit: Payer: Federal, State, Local not specified - PPO | Admitting: Family Medicine

## 2022-03-15 ENCOUNTER — Other Ambulatory Visit: Payer: Self-pay

## 2022-03-15 VITALS — BP 140/98 | HR 84 | Temp 97.7°F | Ht 65.0 in | Wt 267.8 lb

## 2022-03-15 DIAGNOSIS — G8929 Other chronic pain: Secondary | ICD-10-CM | POA: Diagnosis not present

## 2022-03-15 DIAGNOSIS — M25561 Pain in right knee: Secondary | ICD-10-CM | POA: Diagnosis not present

## 2022-03-15 NOTE — Progress Notes (Signed)
? ?Subjective:  ? ? Patient ID: Brian Newton, male    DOB: [birth year], 56 y.o.   MRN: [id number] ? ?HPI ? ?Last week, the patient developed severe pain in his left knee and swelling in his left knee.  He subsequently developed swelling in his left leg such that he was concerned he had a DVT.  He started icing his knee and resting and the swelling went down in his knee and subsequently the swelling and pain went away in his left leg.  Today there is no peripheral edema in the left leg.  He has negative Homans' sign.  There is a slight fluid collection behind his left knee in the popliteal fossa possibly concerning for a Baker's cyst.  He has pain with flexion and extension.  However his biggest concern is the pain in his right knee.  He believes that he is injured his left knee compensating for the pain in his right knee where he has moderate to severe arthritis. ?Past Medical History:  ?Diagnosis Date  ? Arthritis   ? Borderline hyperglycemia   ? Headache(784.0)   ? Hypercholesteremia   ? Hypertension   ? Metabolic syndrome   ? Shingles   ? Sleep apnea   ? uses C PAP  ? Swelling   ? both legs  ? 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 Wrist pain   ? ?Past Surgical History:  ?Procedure Laterality Date  ? APPENDECTOMY  1986  ? BREATH TEK H PYLORI N/A 08/08/2014  ? Procedure: BREATH TEK H PYLORI;  Surgeon: Edward Jolly, MD;  Location: [facility];  Service: General;  Laterality: N/A;  ? COLONOSCOPY N/A 03/02/2013  ? Procedure: COLONOSCOPY;  Surgeon: Beryle Beams, MD;  Location: [facility];  Service: Endoscopy;  Laterality: N/A;  ? 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 Procedure: LAPAROSCOPIC ROUX-EN-Y GASTRIC BYPASS WITH UPPER ENDOSCOPY;  Surgeon: Excell Seltzer, MD;  Location: [facility];  Service: General;  Laterality: N/A;  ? KNEE ARTHROSCOPY  2001  ? rt knee  ? UPPER GI ENDOSCOPY  11/05/2014  ? Procedure: UPPER GI ENDOSCOPY;  Surgeon: Excell Seltzer, MD;  Location: [facility];  Service:  General;;  ? ?Current Outpatient Medications on File Prior to Visit  ?Medication Sig Dispense Refill  ? amLODipine-benazepril (LOTREL) 5-20 MG capsule TAKE (2) CAPSULES BY MOUTH ONCE EVERY MORNING. 180 capsule 3  ? butalbital-acetaminophen-caffeine (FIORICET) 50-325-40 MG tablet TAKE 1 OR 2 TABLETS BY MOUTH EVERY SIX HOURS AS NEEDED FOR HEADACHE. 20 tablet 0  ? clotrimazole-betamethasone (LOTRISONE) cream APPLY TO THE AFFECTED AREA TOPICALLY TWICE DAILY. 45 g 2  ? Cyanocobalamin 500 MCG/0.1ML SOLN Place 500 mcg into the nose once a week.    ? cyclobenzaprine (FLEXERIL) 10 MG tablet TAKE 1 TABLET BY MOUTH 3 TIMES DAILY AS NEEDED. 30 tablet 0  ? diclofenac (VOLTAREN) 75 MG EC tablet TAKE ONE TABLET BY MOUTH TWICE DAILY. 60 tablet 0  ? ferrous sulfate 325 (65 FE) MG tablet Take 325 mg by mouth daily with breakfast.    ? hydrochlorothiazide (HYDRODIURIL) 25 MG tablet TAKE 1 TABLET BY MOUTH ONCE A DAY. 90 tablet 0  ? loratadine (CLARITIN) 10 MG tablet Take 10 mg by mouth every morning.    ? Multiple Vitamins-Minerals (MULTIVITAMIN WITH MINERALS) tablet Take 1 tablet by mouth every morning.     ? OVER THE COUNTER MEDICATION Calcium - '600mg'$  - vitamin d 1500units - magnesium - '120mg'$  - 2 tablets bid    ? OVER THE  COUNTER MEDICATION Iron - '54mg'$  - vitamn c '90mg'$  - 1 qam - 2 pm    ? ?No current facility-administered medications on file prior to visit.  ? ?Allergies  ?Allergen Reactions  ? Penicillins Rash  ? ?Social History  ? ?Socioeconomic History  ? Marital status: Married  ?  Spouse name: Not on file  ? Number of children: 2  ? Years of education: HS  ? Highest education level: Not on file  ?Occupational History  ? Not on file  ?Tobacco Use  ? Smoking status: Never  ? Smokeless tobacco: Never  ?Substance and Sexual Activity  ? Alcohol use: No  ?  Alcohol/week: 0.0 standard drinks  ? Drug use: No  ? Sexual activity: Yes  ?Other Topics Concern  ? Not on file  ?Social History Narrative  ? Denies caffeine use   ? ?Social  Determinants of Health  ? ?Financial Resource Strain: Not on file  ?Food Insecurity: Not on file  ?Transportation Needs: Not on file  ?Physical Activity: Not on file  ?Stress: Not on file  ?Social Connections: Not on file  ?Intimate Partner Violence: Not on file  ? ? ? ?Review of Systems  ?All other systems reviewed and are negative. ? ?   ?Objective:  ? Physical Exam ?Vitals reviewed.  ?Constitutional:   ?   General: He is not in acute distress. ?   Appearance: He is obese. He is not toxic-appearing.  ?Cardiovascular:  ?   Rate and Rhythm: Normal rate and regular rhythm.  ?   Pulses: Normal pulses.  ?   Heart sounds: Normal heart sounds.  ?Pulmonary:  ?   Effort: Pulmonary effort is normal.  ?   Breath sounds: Normal breath sounds.  ?Musculoskeletal:     ?   General: Swelling and tenderness present.  ?   Right knee: Bony tenderness present. Decreased range of motion. Tenderness present.  ?   Left knee: Swelling present. Decreased range of motion.  ?   Right lower leg: No edema.  ?   Left lower leg: No edema.  ?Neurological:  ?   Mental Status: He is alert.  ? ? ? ? ? ?   ?Assessment & Plan:  ?Chronic pain of right knee ?I believe the reason the patient has swelling in his left leg was likely due to a Baker's cyst behind his left knee.  I believe that the Baker's cyst is likely due to osteoarthritis or even cartilage damage in his left knee compensating for his right knee which is his "bad knee".  His left leg feels fine now.  He still has some swelling behind the knee and some tenderness over the medial and lateral joint line that he can live with this per his own report.  He is requesting a cortisone injection in his right knee because he states that this one is "bad".  Using sterile technique, I injected the right knee with 2 cc lidocaine, 2 cc Marcaine, 2 cc of 40 mg/mm Kenalog. ? ?

## 2022-03-30 ENCOUNTER — Other Ambulatory Visit: Payer: Self-pay | Admitting: Family Medicine

## 2022-04-06 ENCOUNTER — Encounter: Payer: Self-pay | Admitting: Family Medicine

## 2022-04-07 NOTE — Telephone Encounter (Signed)
Call pt and left msg  ?

## 2022-04-07 NOTE — Telephone Encounter (Signed)
Per pt believes that he should just go ahead and see Dr. Rhona Raider again. However, pt would like to get your suggest as to what to do? ? ?Pls advise ?

## 2022-04-08 ENCOUNTER — Ambulatory Visit: Payer: Federal, State, Local not specified - PPO | Admitting: Dermatology

## 2022-04-21 DIAGNOSIS — M1711 Unilateral primary osteoarthritis, right knee: Secondary | ICD-10-CM | POA: Diagnosis not present

## 2022-04-21 DIAGNOSIS — M1712 Unilateral primary osteoarthritis, left knee: Secondary | ICD-10-CM | POA: Diagnosis not present

## 2022-04-21 DIAGNOSIS — M170 Bilateral primary osteoarthritis of knee: Secondary | ICD-10-CM | POA: Diagnosis not present

## 2022-05-24 ENCOUNTER — Ambulatory Visit: Payer: Federal, State, Local not specified - PPO | Admitting: Dermatology

## 2022-05-28 ENCOUNTER — Other Ambulatory Visit: Payer: Self-pay | Admitting: Family Medicine

## 2022-05-28 NOTE — Telephone Encounter (Signed)
Requested medication (s) are due for refill today - expired rx  Requested medication (s) are on the active medication list -yes  Future visit scheduled -no  Last refill: 08/13/19 45g 2RF  Notes to clinic: expired Rx, medication not assigned protocol  Requested Prescriptions  Pending Prescriptions Disp Refills   clotrimazole-betamethasone (LOTRISONE) cream [Pharmacy Med Name: CLOTRIMAZOLE/BETA DIP 1% CR] 45 g 0    Sig: APPLY TO THE AFFECTED AREA TOPICALLY TWICE DAILY.     Off-Protocol Failed - 05/28/2022  9:03 AM      Failed - Medication not assigned to a protocol, review manually.      Passed - Valid encounter within last 12 months    Recent Outpatient Visits           2 months ago Chronic pain of right knee   [place] Pickard, Cammie Mcgee, MD   6 months ago Actinic keratosis   [place] Schaumann, Cammie Mcgee, MD   11 months ago Metabolic syndrome   [place] Schaumann, Cammie Mcgee, MD   1 year ago General medical exam   [place] Susy Frizzle, MD   2 years ago Migraine without aura and with status migrainosus, not intractable   [place] Pickard, Cammie Mcgee, MD       Future Appointments             In 3 months Ward Givens, NP [facility]               Requested Prescriptions  Pending Prescriptions Disp Refills   clotrimazole-betamethasone (LOTRISONE) cream [Pharmacy Med Name: CLOTRIMAZOLE/BETA DIP 1% CR] 45 g 0    Sig: APPLY TO THE AFFECTED AREA TOPICALLY TWICE DAILY.     Off-Protocol Failed - 05/28/2022  9:03 AM      Failed - Medication not assigned to a protocol, review manually.      Passed - Valid encounter within last 12 months    Recent Outpatient Visits           2 months ago Chronic pain of right knee   [place] Pickard, Cammie Mcgee, MD   6 months ago Actinic keratosis   [place] Dennard Schaumann, Cammie Mcgee, MD    11 months ago Metabolic syndrome   [place] Pickard, Cammie Mcgee, MD   1 year ago General medical exam   [place] Susy Frizzle, MD   2 years ago Migraine without aura and with status migrainosus, not intractable   [place] Pickard, Cammie Mcgee, MD       Future Appointments             In 3 months Ward Givens, NP [facility]

## 2022-05-31 DIAGNOSIS — M1711 Unilateral primary osteoarthritis, right knee: Secondary | ICD-10-CM | POA: Diagnosis not present

## 2022-05-31 DIAGNOSIS — M1712 Unilateral primary osteoarthritis, left knee: Secondary | ICD-10-CM | POA: Diagnosis not present

## 2022-05-31 DIAGNOSIS — M170 Bilateral primary osteoarthritis of knee: Secondary | ICD-10-CM | POA: Diagnosis not present

## 2022-06-07 DIAGNOSIS — M1712 Unilateral primary osteoarthritis, left knee: Secondary | ICD-10-CM | POA: Diagnosis not present

## 2022-06-07 DIAGNOSIS — M170 Bilateral primary osteoarthritis of knee: Secondary | ICD-10-CM | POA: Diagnosis not present

## 2022-06-07 DIAGNOSIS — M1711 Unilateral primary osteoarthritis, right knee: Secondary | ICD-10-CM | POA: Diagnosis not present

## 2022-06-14 DIAGNOSIS — M170 Bilateral primary osteoarthritis of knee: Secondary | ICD-10-CM | POA: Diagnosis not present

## 2022-06-14 DIAGNOSIS — M1711 Unilateral primary osteoarthritis, right knee: Secondary | ICD-10-CM | POA: Diagnosis not present

## 2022-06-14 DIAGNOSIS — M1712 Unilateral primary osteoarthritis, left knee: Secondary | ICD-10-CM | POA: Diagnosis not present

## 2022-06-26 ENCOUNTER — Other Ambulatory Visit: Payer: Self-pay | Admitting: Family Medicine

## 2022-06-28 ENCOUNTER — Other Ambulatory Visit: Payer: Self-pay | Admitting: Family Medicine

## 2022-06-28 NOTE — Telephone Encounter (Signed)
Requested medication (s) are due for refill today: Yes  Requested medication (s) are on the active medication list: Yes  Last refill:    Future visit scheduled: Yes  Notes to clinic:  Protocol indicates lab work is needed.    Requested Prescriptions  Pending Prescriptions Disp Refills   hydrochlorothiazide (HYDRODIURIL) 25 MG tablet [Pharmacy Med Name: HYDROCHLOROTHIAZIDE 25 MG TAB] 90 tablet 0    Sig: TAKE 1 TABLET BY MOUTH ONCE A DAY.     Cardiovascular: Diuretics - Thiazide Failed - 06/26/2022  9:09 AM      Failed - Cr in normal range and within 180 days    Creat  Date Value Ref Range Status  11/23/2021 1.03 0.70 - 1.30 mg/dL Final         Failed - K in normal range and within 180 days    Potassium  Date Value Ref Range Status  11/23/2021 4.6 3.5 - 5.3 mmol/L Final         Failed - Na in normal range and within 180 days    Sodium  Date Value Ref Range Status  11/23/2021 141 135 - 146 mmol/L Final         Failed - Last BP in normal range    BP Readings from Last 1 Encounters:  03/15/22 (!) 140/98         Passed - Valid encounter within last 6 months    Recent Outpatient Visits           3 months ago Chronic pain of right knee   [place] Susy Frizzle, MD   7 months ago Actinic keratosis   [place] Dennard Schaumann, Cammie Mcgee, MD   12 months ago Metabolic syndrome   [place] Pickard, Cammie Mcgee, MD   1 year ago General medical exam   [place] Susy Frizzle, MD   2 years ago Migraine without aura and with status migrainosus, not intractable   [place] Pickard, Cammie Mcgee, MD       Future Appointments             In 2 months Ward Givens, [facility]

## 2022-07-01 MED ORDER — HYDROCHLOROTHIAZIDE 25 MG PO TABS: 25.0000 mg | ORAL_TABLET | Freq: Every day | ORAL | 0 refills | Status: DC

## 2022-07-01 NOTE — Telephone Encounter (Signed)
Requested Prescriptions  Pending Prescriptions Disp Refills  . hydrochlorothiazide (HYDRODIURIL) 25 MG tablet 90 tablet 0    Sig: Take 1 tablet (25 mg total) by mouth daily.     Cardiovascular: Diuretics - Thiazide Failed - 07/01/2022  6:41 AM      Failed - Cr in normal range and within 180 days    Creat  Date Value Ref Range Status  11/23/2021 1.03 0.70 - 1.30 mg/dL Final         Failed - K in normal range and within 180 days    Potassium  Date Value Ref Range Status  11/23/2021 4.6 3.5 - 5.3 mmol/L Final         Failed - Na in normal range and within 180 days    Sodium  Date Value Ref Range Status  11/23/2021 141 135 - 146 mmol/L Final         Failed - Last BP in normal range    BP Readings from Last 1 Encounters:  03/15/22 (!) 140/98         Passed - Valid encounter within last 6 months    Recent Outpatient Visits          3 months ago Chronic pain of right knee   [place] Susy Frizzle, MD   7 months ago Actinic keratosis   [place] Dennard Schaumann, Cammie Mcgee, MD   1 year ago Metabolic syndrome   [place] Pickard, Cammie Mcgee, MD   1 year ago General medical exam   [place], Warren T, MD   2 years ago 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

## 2022-07-14 DIAGNOSIS — M170 Bilateral primary osteoarthritis of knee: Secondary | ICD-10-CM | POA: Diagnosis not present

## 2022-08-29 ENCOUNTER — Other Ambulatory Visit: Payer: Self-pay | Admitting: Family Medicine

## 2022-08-30 ENCOUNTER — Encounter: Payer: Self-pay | Admitting: *Deleted

## 2022-08-30 NOTE — Progress Notes (Unsigned)
PATIENT: Brian Newton DOB: Aug 26, 1966  REASON FOR VISIT: follow up HISTORY FROM: patient PRIMARY NEUROLOGIST:   Virtual Visit via Video Note  I connected with Brian Newton on 08/31/22 at  2:00 PM EDT by a video enabled telemedicine application located remotely at [facility] and verified that I am speaking with the correct person using two identifiers who was located at their own home.   I discussed the limitations of evaluation and management by telemedicine and the availability of in person appointments. The patient expressed understanding and agreed to proceed.   PATIENT: Brian Newton DOB: 13-May-1966  REASON FOR VISIT: follow up HISTORY FROM: patient  HISTORY OF PRESENT ILLNESS: Today 08/31/22:  Brian Newton is a 56 year old male with a history of obstructive sleep apnea on CPAP.  He returns today for follow-up.  He reports that the CPAP continues to work well for him.  He has a second machine that he uses when he goes on vacation.  He states he uses the machine nightly.  Still finds it beneficial.    09/01/21: Brian Newton is a 56 year old male with a history of obstructive sleep apnea on CPAP.  He returns today for follow-up.  He reports that the CPAP is working well for him.  He is currently on vacation he reports that when he was on vacation he takes an old machine with him.  He states that he does not miss a night using his CPAP machine.     REVIEW OF SYSTEMS: Out of a complete 14 system review of symptoms, the patient complains only of the following symptoms, and all other reviewed systems are negative.  ALLERGIES: Allergies  Allergen Reactions   Penicillins Rash    HOME MEDICATIONS: Outpatient Medications Prior to Visit  Medication Sig Dispense Refill   amLODipine-benazepril (LOTREL) 5-20 MG capsule TAKE (2) CAPSULES BY MOUTH ONCE EVERY MORNING. 180 capsule 3   butalbital-acetaminophen-caffeine (FIORICET) 50-325-40 MG tablet  TAKE 1 OR 2 TABLETS BY MOUTH EVERY SIX HOURS AS NEEDED FOR HEADACHE. 20 tablet 0   clotrimazole-betamethasone (LOTRISONE) cream APPLY TO THE AFFECTED AREA TOPICALLY TWICE DAILY. 45 g 0   Cyanocobalamin 500 MCG/0.1ML SOLN Place 500 mcg into the nose once a week.     cyclobenzaprine (FLEXERIL) 10 MG tablet TAKE 1 TABLET BY MOUTH 3 TIMES DAILY AS NEEDED. 30 tablet 0   diclofenac (VOLTAREN) 75 MG EC tablet TAKE ONE TABLET BY MOUTH TWICE DAILY. 60 tablet 0   ferrous sulfate 325 (65 FE) MG tablet Take 325 mg by mouth daily with breakfast.     hydrochlorothiazide (HYDRODIURIL) 25 MG tablet Take 1 tablet (25 mg total) by mouth daily. 90 tablet 0   loratadine (CLARITIN) 10 MG tablet Take 10 mg by mouth every morning.     Multiple Vitamins-Minerals (MULTIVITAMIN WITH MINERALS) tablet Take 1 tablet by mouth every morning.      OVER THE COUNTER MEDICATION Calcium - 600mg  - vitamin d 1500units - magnesium - 120mg  - 2 tablets bid     OVER THE COUNTER MEDICATION Iron - 54mg  - vitamn c 90mg  - 1 qam - 2 pm     No facility-administered medications prior to visit.    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)    Wrist pain     PAST SURGICAL HISTORY: Past Surgical History:  Procedure Laterality Date   APPENDECTOMY  1986   BREATH TEK H PYLORI N/A 08/08/2014   Procedure: BREATH TEK H PYLORI;  Surgeon: Mariella Saa, MD;  Location: [facility];  Service: General;  Laterality: N/A;   COLONOSCOPY N/A 03/02/2013   Procedure: COLONOSCOPY;  Surgeon: Theda Belfast, MD;  Location: [facility];  Service: Endoscopy;  Laterality: N/A;   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   Procedure: LAPAROSCOPIC ROUX-EN-Y GASTRIC BYPASS WITH UPPER ENDOSCOPY;  Surgeon:  Glenna Fellows, MD;  Location: [facility];  Service: General;  Laterality: N/A;   KNEE ARTHROSCOPY  2001   rt knee   UPPER GI ENDOSCOPY  11/05/2014   Procedure: UPPER GI ENDOSCOPY;  Surgeon: Glenna Fellows, MD;  Location: [facility];  Service: General;;    FAMILY HISTORY: Family History  Problem Relation Age of Onset   Hypertension Mother    Hypertension Other    Hyperlipidemia Other    Heart disease Other    Obesity Other    Sleep apnea Other    Breast cancer Maternal Grandmother    Colon cancer Maternal Grandfather     SOCIAL HISTORY: Social History   Socioeconomic History   Marital status: Married    Spouse name: Not on file   Number of children: 2   Years of education: HS   Highest education level: Not on file  Occupational History   Not on file  Tobacco Use   Smoking status: Never   Smokeless tobacco: Never  Substance and Sexual Activity   Alcohol use: No    Alcohol/week: 0.0 standard drinks of alcohol   Drug use: No   Sexual activity: Yes  Other Topics Concern   Not on file  Social History Narrative   Denies caffeine use    Social Determinants of Health   Financial Resource Strain: Not on file  Food Insecurity: Not on file  Transportation Needs: Not on file  Physical Activity: Not on file  Stress: Not on file  Social Connections: Not on file  Intimate Partner Violence: Not on file      PHYSICAL EXAM Generalized: Well developed, in no acute distress   Neurological examination  Mentation: Alert oriented to time, place, history taking. Follows all commands speech and language fluent Cranial nerve II-XII:Extraocular movements were full. Facial symmetry noted. Head turning and shoulder shrug  were normal and symmetric.  DIAGNOSTIC DATA (LABS, IMAGING, TESTING) - I reviewed patient records, labs, notes, testing and imaging myself where available.  Lab Results  Component Value Date   WBC 7.4 11/23/2021   HGB 15.4 11/23/2021   HCT 45.9 11/23/2021    MCV 91.8 11/23/2021   PLT 351 11/23/2021      Component Value Date/Time   NA 141 11/23/2021 1046   K 4.6 11/23/2021 1046   CL 101 11/23/2021 1046   CO2 32 11/23/2021 1046   GLUCOSE 93 11/23/2021 1046   BUN 16 11/23/2021 1046   CREATININE 1.03 11/23/2021 1046   CALCIUM 9.4 11/23/2021 1046   PROT 6.3 11/23/2021 1046   ALBUMIN 4.0 04/26/2017 0829   AST 20 11/23/2021 1046   ALT 23 11/23/2021 1046   ALKPHOS 77 04/26/2017 0829   BILITOT 0.5 11/23/2021 1046   GFRNONAA 84 11/18/2020 0809   GFRAA 97 11/18/2020 0809   Lab Results  Component  Value Date   CHOL 165 11/23/2021   HDL 53 11/23/2021   LDLCALC 95 11/23/2021   TRIG 84 11/23/2021   CHOLHDL 3.1 11/23/2021   Lab Results  Component Value Date   HGBA1C 5.7 (H) 11/23/2021   Lab Results  Component Value Date   VITAMINB12 1,228 (H) 11/23/2021   Lab Results  Component Value Date   TSH 1.17 04/26/2017      ASSESSMENT AND PLAN 56 y.o. 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 here with:  OSA on CPAP  CPAP compliance excellent Residual AHI is good Encouraged patient to continue using CPAP nightly and > 4 hours each night F/U in 1 year or sooner if needed    Butch Penny, MSN, NP-C 08/31/2022, 1:48 PM [facility] [address], [address] [place], [place] [address] [phone]

## 2022-08-31 ENCOUNTER — Telehealth (INDEPENDENT_AMBULATORY_CARE_PROVIDER_SITE_OTHER): Payer: Federal, State, Local not specified - PPO | Admitting: Adult Health

## 2022-08-31 DIAGNOSIS — G4733 Obstructive sleep apnea (adult) (pediatric): Secondary | ICD-10-CM

## 2022-08-31 DIAGNOSIS — Z9989 Dependence on other enabling machines and devices: Secondary | ICD-10-CM

## 2022-09-29 ENCOUNTER — Other Ambulatory Visit: Payer: Self-pay | Admitting: Family Medicine

## 2022-09-29 MED ORDER — HYDROCHLOROTHIAZIDE 25 MG PO TABS: 25.0000 mg | ORAL_TABLET | Freq: Every day | ORAL | 0 refills | Status: DC

## 2022-11-18 ENCOUNTER — Other Ambulatory Visit: Payer: Federal, State, Local not specified - PPO

## 2022-11-18 DIAGNOSIS — E7800 Pure hypercholesterolemia, unspecified: Secondary | ICD-10-CM

## 2022-11-18 DIAGNOSIS — E8881 Metabolic syndrome: Secondary | ICD-10-CM | POA: Diagnosis not present

## 2022-11-18 DIAGNOSIS — I10 Essential (primary) hypertension: Secondary | ICD-10-CM | POA: Diagnosis not present

## 2022-11-18 DIAGNOSIS — Z86010 Personal history of colonic polyps: Secondary | ICD-10-CM

## 2022-11-18 DIAGNOSIS — Z125 Encounter for screening for malignant neoplasm of prostate: Secondary | ICD-10-CM | POA: Diagnosis not present

## 2022-11-19 LAB — CBC WITH DIFFERENTIAL/PLATELET
Absolute Monocytes: 611 cells/uL (ref 200–950)
Basophils Absolute: 20 cells/uL (ref 0–200)
Basophils Relative: 0.3 %
Eosinophils Absolute: 189 cells/uL (ref 15–500)
Eosinophils Relative: 2.9 %
HCT: 43.6 % (ref 38.5–50.0)
Hemoglobin: 14.8 g/dL (ref 13.2–17.1)
Lymphs Abs: 1931 cells/uL (ref 850–3900)
MCH: 31.1 pg (ref 27.0–33.0)
MCHC: 33.9 g/dL (ref 32.0–36.0)
MCV: 91.6 fL (ref 80.0–100.0)
MPV: 9.8 fL (ref 7.5–12.5)
Monocytes Relative: 9.4 %
Neutro Abs: 3751 cells/uL (ref 1500–7800)
Neutrophils Relative %: 57.7 %
Platelets: 341 10*3/uL (ref 140–400)
RBC: 4.76 10*6/uL (ref 4.20–5.80)
RDW: 13.1 % (ref 11.0–15.0)
Total Lymphocyte: 29.7 %
WBC: 6.5 10*3/uL (ref 3.8–10.8)

## 2022-11-19 LAB — COMPLETE METABOLIC PANEL WITH GFR
AG Ratio: 1.8 (calc) (ref 1.0–2.5)
ALT: 25 U/L (ref 9–46)
AST: 21 U/L (ref 10–35)
Albumin: 4 g/dL (ref 3.6–5.1)
Alkaline phosphatase (APISO): 76 U/L (ref 35–144)
BUN: 18 mg/dL (ref 7–25)
CO2: 28 mmol/L (ref 20–32)
Calcium: 9.2 mg/dL (ref 8.6–10.3)
Chloride: 104 mmol/L (ref 98–110)
Creat: 0.97 mg/dL (ref 0.70–1.30)
Globulin: 2.2 g/dL (calc) (ref 1.9–3.7)
Glucose, Bld: 94 mg/dL (ref 65–99)
Potassium: 4 mmol/L (ref 3.5–5.3)
Sodium: 140 mmol/L (ref 135–146)
Total Bilirubin: 0.3 mg/dL (ref 0.2–1.2)
Total Protein: 6.2 g/dL (ref 6.1–8.1)
eGFR: 90 mL/min/{1.73_m2} (ref >=60)

## 2022-11-19 LAB — LIPID PANEL
Cholesterol: 158 mg/dL (ref <200)
HDL: 50 mg/dL (ref >=40)
LDL Cholesterol (Calc): 92 mg/dL (calc)
Non-HDL Cholesterol (Calc): 108 mg/dL (calc) (ref <130)
Total CHOL/HDL Ratio: 3.2 (calc) (ref <5.0)
Triglycerides: 72 mg/dL (ref <150)

## 2022-11-19 LAB — HEMOGLOBIN A1C
Hgb A1c MFr Bld: 5.9 % of total Hgb — ABNORMAL HIGH (ref <5.7)
Mean Plasma Glucose: 123 mg/dL
eAG (mmol/L): 6.8 mmol/L

## 2022-11-19 LAB — VITAMIN D 25 HYDROXY (VIT D DEFICIENCY, FRACTURES): Vit D, 25-Hydroxy: 72 ng/mL (ref 30–100)

## 2022-11-19 LAB — VITAMIN B12: Vitamin B-12: >2000 pg/mL — ABNORMAL HIGH (ref 200–1100)

## 2022-11-19 LAB — PSA: PSA: 0.48 ng/mL (ref <=4.00)

## 2022-11-29 ENCOUNTER — Ambulatory Visit (INDEPENDENT_AMBULATORY_CARE_PROVIDER_SITE_OTHER): Payer: Federal, State, Local not specified - PPO | Admitting: Family Medicine

## 2022-11-29 VITALS — BP 126/82 | HR 64 | Ht 65.0 in | Wt 265.0 lb

## 2022-11-29 DIAGNOSIS — Z0000 Encounter for general adult medical examination without abnormal findings: Secondary | ICD-10-CM | POA: Diagnosis not present

## 2022-11-29 DIAGNOSIS — E8881 Metabolic syndrome: Secondary | ICD-10-CM | POA: Diagnosis not present

## 2022-11-29 DIAGNOSIS — I10 Essential (primary) hypertension: Secondary | ICD-10-CM | POA: Diagnosis not present

## 2022-11-29 NOTE — Progress Notes (Signed)
Subjective:    Patient ID: Brian Newton, male    DOB: 04-19-1966, 56 y.o.   MRN: [id number] Patient is a very pleasant 56 year old Caucasian male here today for complete physical exam.  His last colonoscopy was in 2019 and is due again in 2026.  Patient states he is doing very well.  He switched from nasal B12 to oral B12 and B12 levels remain excellent.  He recently had his flu shot, his COVID shot, and his shingles vaccine at his local pharmacy.  Therefore the immunizations are up-to-date.  His blood work as shown below is outstanding.  His PSA is normal.  He denies any medical concerns.  Immunization History  Administered Date(s) Administered   Influenza Inj Mdck Quad Pf 10/02/2018, 10/06/2018   Influenza,inj,Quad PF,6+ Mos 09/19/2014, 10/02/2015, 10/07/2016, 10/04/2017, 08/16/2019, 10/29/2020   Influenza-Unspecified 09/19/2018, 11/06/2021   Moderna Covid-19 Vaccine Bivalent Booster 58yr & up 11/06/2021   Tdap 06/20/2007, 11/02/2018   Lab on 11/18/2022  Component Date Value Ref Range Status   WBC 11/18/2022 6.5  3.8 - 10.8 Thousand/uL Final   RBC 11/18/2022 4.76  4.20 - 5.80 Million/uL Final   Hemoglobin 11/18/2022 14.8  13.2 - 17.1 g/dL Final   HCT 11/18/2022 43.6  38.5 - 50.0 % Final   MCV 11/18/2022 91.6  80.0 - 100.0 fL Final   MCH 11/18/2022 31.1  27.0 - 33.0 pg Final   MCHC 11/18/2022 33.9  32.0 - 36.0 g/dL Final   RDW 11/18/2022 13.1  11.0 - 15.0 % Final   Platelets 11/18/2022 341  140 - 400 Thousand/uL Final   MPV 11/18/2022 9.8  7.5 - 12.5 fL Final   Neutro Abs 11/18/2022 3,751  1,500 - 7,800 cells/uL Final   Lymphs Abs 11/18/2022 1,931  850 - 3,900 cells/uL Final   Absolute Monocytes 11/18/2022 611  200 - 950 cells/uL Final   Eosinophils Absolute 11/18/2022 189  15 - 500 cells/uL Final   Basophils Absolute 11/18/2022 20  0 - 200 cells/uL Final   Neutrophils Relative % 11/18/2022 57.7  % Final   Total Lymphocyte 11/18/2022 29.7  % Final   Monocytes Relative  11/18/2022 9.4  % Final   Eosinophils Relative 11/18/2022 2.9  % Final   Basophils Relative 11/18/2022 0.3  % Final   Hgb A1c MFr Bld 11/18/2022 5.9 (H)  <5.7 % of total Hgb Final   Comment: For someone without known diabetes, a hemoglobin  A1c value between 5.7% and 6.4% is consistent with prediabetes and should be confirmed with a  follow-up test. . For someone with known diabetes, a value <7% indicates that their diabetes is well controlled. A1c targets should be individualized based on duration of diabetes, age, comorbid conditions, and other considerations. . This assay result is consistent with an increased risk of diabetes. . Currently, no consensus exists regarding use of hemoglobin A1c for diagnosis of diabetes for children. .    Mean Plasma Glucose 11/18/2022 123  mg/dL Final   eAG (mmol/L) 11/18/2022 6.8  mmol/L Final   Cholesterol 11/18/2022 158  <200 mg/dL Final   HDL 11/18/2022 50  > OR = 40 mg/dL Final   Triglycerides 11/18/2022 72  <150 mg/dL Final   LDL Cholesterol (Calc) 11/18/2022 92  mg/dL (calc) Final   Comment: Reference range: <100 . Desirable range <100 mg/dL for primary prevention;   <70 mg/dL for patients with CHD or diabetic patients  with > or = 2 CHD risk factors. .Marland KitchenLDL-C is now calculated using  the Martin-Hopkins  calculation, which is a validated novel method providing  better accuracy than the Friedewald equation in the  estimation of LDL-C.  Cresenciano Genre et al. Annamaria Helling. 8768;115(72): 2061-2068  (http://education.[facility].com/faq/FAQ164)    Total CHOL/HDL Ratio 11/18/2022 3.2  <5.0 (calc) Final   Non-HDL Cholesterol (Calc) 11/18/2022 108  <130 mg/dL (calc) Final   Comment: For patients with diabetes plus 1 major ASCVD risk  factor, treating to a non-HDL-C goal of <100 mg/dL  (LDL-C of <70 mg/dL) is considered a therapeutic  option.    Vitamin B-12 11/18/2022 >2,000 (H)  200 - 1,100 pg/mL Final   Vit D, 25-Hydroxy 11/18/2022 72  30 -  100 ng/mL Final   Comment: Vitamin D Status         25-OH Vitamin D: . Deficiency:                    <20 ng/mL Insufficiency:             20 - 29 ng/mL Optimal:                 > or = 30 ng/mL . For 25-OH Vitamin D testing on patients on  D2-supplementation and patients for whom quantitation  of D2 and D3 fractions is required, the QuestAssureD(TM) 25-OH VIT D, (D2,D3), LC/MS/MS is recommended: order  code [phone] (patients >58yr). . See Note 1 . Note 1 . For additional information, please refer to  http://education.[facility].com/faq/FAQ199  (This link is being provided for informational/ educational purposes only.)    PSA 11/18/2022 0.48  < OR = 4.00 ng/mL Final   Comment: The total PSA value from this assay system is  standardized against the WHO standard. The test  result will be approximately 20% lower when compared  to the equimolar-standardized total PSA (Beckman  Coulter). Comparison of serial PSA results should be  interpreted with this fact in mind. . This test was performed using the Siemens  chemiluminescent method. Values obtained from  different assay methods cannot be used interchangeably. PSA levels, regardless of value, should not be interpreted as absolute evidence of the presence or absence of disease.    Glucose, Bld 11/18/2022 94  65 - 99 mg/dL Final   Comment: .            Fasting reference interval .    BUN 11/18/2022 18  7 - 25 mg/dL Final   Creat 11/18/2022 0.97  0.70 - 1.30 mg/dL Final   eGFR 11/18/2022 90  > OR = 60 mL/min/1.765mFinal   BUN/Creatinine Ratio 11/18/2022 SEE NOTE:  6 - 22 (calc) Final   Comment:    Not Reported: BUN and Creatinine are within    reference range. .    Sodium 11/18/2022 140  135 - 146 mmol/L Final   Potassium 11/18/2022 4.0  3.5 - 5.3 mmol/L Final   Chloride 11/18/2022 104  98 - 110 mmol/L Final   CO2 11/18/2022 28  20 - 32 mmol/L Final   Calcium 11/18/2022 9.2  8.6 - 10.3 mg/dL Final   Total Protein  11/18/2022 6.2  6.1 - 8.1 g/dL Final   Albumin 11/18/2022 4.0  3.6 - 5.1 g/dL Final   Globulin 11/18/2022 2.2  1.9 - 3.7 g/dL (calc) Final   AG Ratio 11/18/2022 1.8  1.0 - 2.5 (calc) Final   Total Bilirubin 11/18/2022 0.3  0.2 - 1.2 mg/dL Final   Alkaline phosphatase (APISO) 11/18/2022 76  35 - 144 U/L Final  AST 11/18/2022 21  10 - 35 U/L Final   ALT 11/18/2022 25  9 - 46 U/L Final    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 90 tablet 0   amLODipine-benazepril (LOTREL) 5-20 MG capsule TAKE (2) CAPSULES BY MOUTH ONCE EVERY MORNING. 180 capsule 3   butalbital-acetaminophen-caffeine (FIORICET) 50-325-40 MG tablet TAKE 1 OR 2 TABLETS BY MOUTH EVERY SIX HOURS AS NEEDED FOR HEADACHE. 20 tablet 0   clotrimazole-betamethasone (LOTRISONE) cream APPLY TO THE AFFECTED AREA TOPICALLY TWICE DAILY. 45 g 0   Cyanocobalamin 500 MCG/0.1ML SOLN Place 500 mcg into the nose once a week.     cyclobenzaprine (FLEXERIL) 10 MG tablet TAKE 1 TABLET BY MOUTH 3 TIMES DAILY AS NEEDED. 30 tablet 0   diclofenac (VOLTAREN) 75 MG EC tablet TAKE ONE TABLET BY MOUTH TWICE DAILY. 60 tablet 0   ferrous sulfate 325 (65 FE) MG tablet Take 325 mg by mouth daily with breakfast.     loratadine (CLARITIN) 10 MG tablet Take 10 mg by mouth every morning.     Multiple Vitamins-Minerals (MULTIVITAMIN WITH MINERALS) tablet Take 1 tablet by mouth every morning.      OVER THE COUNTER MEDICATION Calcium - 688m - vitamin d 1500units - magnesium - 1219m- 2 tablets bid     OVER THE COUNTER MEDICATION  Iron - 5444m vitamn c 27m13m1 qam - 2 pm     No current facility-administered medications on file prior to visit.   Allergies  Allergen Reactions   Penicillins Rash   Social History   Socioeconomic History   Marital status: Married    Spouse name: Not on file   Number of children: 2   Years of education: HS   Highest education level: Not on file  Occupational History   Not on file  Tobacco Use   Smoking status: Never   Smokeless tobacco: Never  Substance and Sexual Activity   Alcohol use: No    Alcohol/week: 0.0 standard drinks of alcohol   Drug use: No   Sexual activity: Yes  Other Topics Concern   Not on file  Social History Narrative   Denies caffeine use    Social Determinants of Health   Financial Resource Strain: Not on file  Food Insecurity: Not on file  Transportation Needs: Not on file  Physical Activity: Not on file  Stress: Not on file  Social Connections: Not on file  Intimate Partner Violence: Not on file      Review of Systems  All other systems reviewed and are negative.      Objective:   Physical Exam Vitals reviewed.  Constitutional:      General: He is not in acute distress.    Appearance: He is well-developed. He is not diaphoretic.  HENT:     Head: Normocephalic and atraumatic.     Right Ear: External ear normal.     Left Ear: External ear normal.  Nose: Nose normal.     Mouth/Throat:     Pharynx: No oropharyngeal exudate.  Eyes:     General: No scleral icterus.       Right eye: No discharge.        Left eye: No discharge.     Conjunctiva/sclera: Conjunctivae normal.     Pupils: Pupils are equal, round, and reactive to light.  Neck:     Thyroid: No thyromegaly.     Vascular: No JVD.     Trachea: No tracheal deviation.  Cardiovascular:     Rate and Rhythm: Normal rate and regular rhythm.     Heart sounds: Normal heart sounds. No murmur heard.    No friction rub. No gallop.  Pulmonary:     Effort: Pulmonary effort is  normal. No respiratory distress.     Breath sounds: Normal breath sounds. No wheezing or rales.  Chest:     Chest wall: No tenderness.  Abdominal:     General: Bowel sounds are normal. There is no distension.     Palpations: Abdomen is soft. There is no mass.     Tenderness: There is no abdominal tenderness. There is no guarding or rebound.  Musculoskeletal:        General: No tenderness or deformity. Normal range of motion.     Cervical back: Neck supple.  Lymphadenopathy:     Cervical: No cervical adenopathy.  Skin:    General: Skin is warm.     Coloration: Skin is not pale.     Findings: No erythema or rash.  Neurological:     Mental Status: He is alert and oriented to person, place, and time.     Cranial Nerves: No cranial nerve deficit.     Motor: No abnormal muscle tone.     Coordination: Coordination normal.     Deep Tendon Reflexes: Reflexes are normal and symmetric.  Psychiatric:        Behavior: Behavior normal.        Thought Content: Thought content normal.        Judgment: Judgment normal.         Assessment & Plan:  General medical exam  Metabolic syndrome  Essential hypertension Metabolic syndrome remains in remission after bariatric surgery.  Blood pressure is excellent.  Immunizations are up-to-date.  Colonoscopy and PSA are up-to-date.  Regular anticipatory guidance is provided.  The remainder of his preventative care is up-to-date.  Review of systems is negative.

## 2022-12-09 ENCOUNTER — Ambulatory Visit
Admission: EM | Admit: 2022-12-09 | Discharge: 2022-12-09 | Disposition: A | Discharge status: Home / Self Care | Payer: Federal, State, Local not specified - PPO | Attending: Family Medicine | Admitting: Family Medicine

## 2022-12-09 ENCOUNTER — Ambulatory Visit (INDEPENDENT_AMBULATORY_CARE_PROVIDER_SITE_OTHER): Payer: Federal, State, Local not specified - PPO

## 2022-12-09 DIAGNOSIS — M79602 Pain in left arm: Secondary | ICD-10-CM | POA: Diagnosis not present

## 2022-12-09 DIAGNOSIS — W19XXXA Unspecified fall, initial encounter: Secondary | ICD-10-CM

## 2022-12-09 DIAGNOSIS — M79622 Pain in left upper arm: Secondary | ICD-10-CM | POA: Diagnosis not present

## 2022-12-09 MED ORDER — HYDROCODONE-ACETAMINOPHEN 5-325 MG PO TABS: 1.0000 | ORAL_TABLET | Freq: Two times a day (BID) | ORAL | 0 refills | Status: DC | PRN

## 2022-12-09 NOTE — ED Triage Notes (Signed)
Pt reports he was doing yard work pulled something in the yard it gave and he fell directly onto his left forearm/ elbow  area 11:20 this morning . He has limited ROM in left arm. He cannot raise it at all past a resting position. Cannot bend elbow. Says he felt nauseas after he fell. Says he did not fall on any objects.

## 2022-12-09 NOTE — Discharge Instructions (Signed)
Your x-ray today shows concern for a possible rotator cuff tear.  You may call the orthopedist you are comfortable with the see if somebody can see you before the weekend but if not there is an emergent orthopedic walk-in clinic here in [place] that should be able to take care of you or Raliegh Ip and emergent orthopedics also have walk-in clinics in [place].  We have placed you in a sling for comfort and I have sent over a small supply of pain medication as well.

## 2022-12-09 NOTE — ED Provider Notes (Signed)
[facility]    CSN: [id number] Arrival date & time: 12/09/22  1215      History   Chief Complaint Chief Complaint  Patient presents with   Fall    possible broken arm - Entered by patient    HPI Brian Newton is a 56 y.o. male.   Patient presenting today with severe left anterior upper arm pain after pulling on something in the yard this morning that gave way and caused him to fall directly onto his left elbow he states he has limited range of motion to the arm though the pain is not in his elbow wrist or hand.  Feels like the pain is in the upper arm going into the anterior shoulder and he has very little range of motion to this area.  Denies numbness, tingling, radiation of pain down the arm, swelling, discoloration that he has noted.  So far has not taken anything over-the-counter for symptoms.    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 ([place])    Metabolic syndrome     Past Surgical History:  Procedure Laterality Date   APPENDECTOMY  1986   BREATH TEK H PYLORI N/A 08/08/2014   Procedure: BREATH TEK H PYLORI;  Surgeon: Edward Jolly, MD;  Location: [facility];  Service: General;  Laterality: N/A;   COLONOSCOPY N/A 03/02/2013   Procedure: COLONOSCOPY;  Surgeon: Beryle Beams, MD;  Location: [facility];  Service: Endoscopy;  Laterality: N/A;   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 Authorizing Provider  HYDROcodone-acetaminophen (NORCO/VICODIN) 5-325 MG tablet Take 1 tablet by mouth 2 (two) times daily as needed for moderate pain. 12/09/22  Yes Volney American, PA-C  amLODipine-benazepril (LOTREL) 5-20 MG capsule TAKE (2) CAPSULES BY MOUTH ONCE EVERY MORNING. 01/04/22   Susy Frizzle, MD  butalbital-acetaminophen-caffeine (FIORICET) 50-325-40 MG tablet TAKE 1 OR 2 TABLETS BY MOUTH EVERY SIX HOURS AS NEEDED FOR HEADACHE. 02/11/22   Susy Frizzle, MD  clotrimazole-betamethasone (LOTRISONE) cream APPLY TO THE AFFECTED AREA TOPICALLY TWICE DAILY. 05/30/22   Susy Frizzle, MD  cyclobenzaprine (FLEXERIL) 10 MG tablet TAKE 1 TABLET BY MOUTH 3 TIMES DAILY AS NEEDED. 02/11/22   Susy Frizzle, MD  diclofenac (VOLTAREN) 75 MG EC tablet TAKE ONE TABLET BY MOUTH TWICE DAILY. 08/30/22   Susy Frizzle, MD  ferrous sulfate 325 (65 FE) MG tablet Take 325 mg by mouth daily with breakfast.    [provider]  hydrochlorothiazide (HYDRODIURIL) 25 MG tablet Take 1 tablet (25 mg total) by mouth daily. 09/29/22   Susy Frizzle, MD  loratadine (CLARITIN) 10 MG tablet Take 10 mg by mouth every morning.    [provider]  Multiple Vitamins-Minerals (MULTIVITAMIN WITH MINERALS) tablet Take 1 tablet by mouth every morning.     [provider]  OVER THE COUNTER MEDICATION Calcium - '600mg'$  - vitamin d 1500units - magnesium - '120mg'$  - 2 tablets bid    [provider]  OVER THE COUNTER MEDICATION Iron - '54mg'$  - vitamn c '90mg'$  - 1 qam - 2 pm    [provider]    Family History Family History  Problem  Relation Age of Onset   Hypertension Mother    Hypertension Other    Hyperlipidemia Other    Heart disease Other    Obesity Other    Sleep apnea Other    Breast cancer Maternal Grandmother    Colon cancer Maternal Grandfather     Social History Social History   Tobacco Use   Smoking status: Never   Smokeless tobacco: Never  Substance Use Topics   Alcohol use: No    Alcohol/week: 0.0 standard drinks of alcohol   Drug use: No   Allergies   Penicillins   Review of Systems Review of Systems Per HPI  Physical Exam Triage Vital Signs ED Triage Vitals  Enc Vitals Group     BP 12/09/22 1326 (!) 162/91     Pulse Rate 12/09/22 1326 79     Resp 12/09/22 1326 20     Temp 12/09/22 1326 98.3 F (36.8 C)     Temp Source 12/09/22 1326 Oral     SpO2 12/09/22 1326 96 %     Weight --      Height --      Head Circumference --      Peak Flow --      Pain Score 12/09/22 1330 3     Pain Loc --      Pain Edu? --      Excl. in [place]? --    No data found.  Updated Vital Signs BP (!) 162/91 (BP Location: Right Arm)   Pulse 79   Temp 98.3 F (36.8 C) (Oral)   Resp 20   SpO2 96%   Visual Acuity Right Eye Distance:   Left Eye Distance:   Bilateral Distance:    Right Eye Near:   Left Eye Near:    Bilateral Near:     Physical Exam Vitals and nursing note reviewed.  Constitutional:      Appearance: He is well-developed.  HENT:     Head: Atraumatic.  Eyes:     Pupils: Pupils are equal, round, and reactive to light.  Cardiovascular:     Rate and Rhythm: Normal rate and regular rhythm.  Pulmonary:     Effort: Pulmonary effort is normal. No respiratory distress.     Breath sounds: No wheezing or rales.  Musculoskeletal:        General: Swelling, tenderness and signs of injury present. No deformity.     Cervical back: Normal range of motion and neck supple.     Comments: Severe tenderness to palpation to left upper arm into anterior shoulder.  Very limited range of motion  exam today due to severity of pain.  Grip strength full and equal bilateral hands.  Nontender to left elbow, wrist  Lymphadenopathy:     Cervical:  No cervical adenopathy.  Skin:    General: Skin is warm and dry.     Findings: No bruising or erythema.  Neurological:     Mental Status: He is alert and oriented to person, place, and time.     Comments: Left upper extremity neurovascularly intact  Psychiatric:        Behavior: Behavior normal.      UC Treatments / Results  Labs (all labs ordered are listed, but only abnormal results are displayed) Labs Reviewed - No data to display  EKG   Radiology DG Humerus Left  Result Date: 12/09/2022 CLINICAL DATA:  left upper arm pain after a fall yesterday, decreased ROM EXAM: LEFT HUMERUS - 2+ VIEW COMPARISON:  02/13/2019 FINDINGS: High-riding humeral head. There is no evidence of fracture or other focal bone lesions. Soft tissues are unremarkable. IMPRESSION: High-riding humeral head suggesting rotator cuff tear. Electronically Signed   By: Lucrezia Europe M.D.   On: 12/09/2022 14:17    Procedures Procedures (including critical care time)  Medications Ordered in UC Medications - No data to display  Initial Impression / Assessment and Plan / UC Course  I have reviewed the triage vital signs and the nursing notes.  Pertinent labs & imaging results that were available during my care of the patient were reviewed by me and considered in my medical decision making (see chart for details).     X-ray today negative for acute bony abnormality, however showing a high riding humeral head suggestive of possible rotator cuff tear.  Discussed with patient that he would need soft tissue imaging to further evaluate this and orthopedic resources given.  Placed in a sling for comfort and given a small supply of pain medication until he can get in with the orthopedist.  Final Clinical Impressions(s) / UC Diagnoses   Final diagnoses:  Left arm pain  Fall,  initial encounter     Discharge Instructions      Your x-ray today shows concern for a possible rotator cuff tear.  You may call the orthopedist you are comfortable with the see if somebody can see you before the weekend but if not there is an emergent orthopedic walk-in clinic here in [place] that should be able to take care of you or Raliegh Ip and emergent orthopedics also have walk-in clinics in [place].  We have placed you in a sling for comfort and I have sent over a small supply of pain medication as well.    ED Prescriptions     Medication Sig Dispense Auth. Provider   HYDROcodone-acetaminophen (NORCO/VICODIN) 5-325 MG tablet Take 1 tablet by mouth 2 (two) times daily as needed for moderate pain. 10 tablet Volney American, [place]      I have reviewed the PDMP during this encounter.   Volney American, [place] 12/09/22 [phone]

## 2022-12-10 ENCOUNTER — Encounter: Payer: Self-pay | Admitting: Family Medicine

## 2022-12-15 ENCOUNTER — Encounter: Payer: Self-pay | Admitting: Orthopedic Surgery

## 2022-12-15 ENCOUNTER — Ambulatory Visit (INDEPENDENT_AMBULATORY_CARE_PROVIDER_SITE_OTHER): Payer: Federal, State, Local not specified - PPO

## 2022-12-15 ENCOUNTER — Ambulatory Visit (INDEPENDENT_AMBULATORY_CARE_PROVIDER_SITE_OTHER): Payer: Federal, State, Local not specified - PPO | Admitting: Orthopedic Surgery

## 2022-12-15 VITALS — BP 153/88 | HR 75 | Ht 65.0 in | Wt 264.0 lb

## 2022-12-15 DIAGNOSIS — S46012A Strain of muscle(s) and tendon(s) of the rotator cuff of left shoulder, initial encounter: Secondary | ICD-10-CM

## 2022-12-15 DIAGNOSIS — G8929 Other chronic pain: Secondary | ICD-10-CM

## 2022-12-15 DIAGNOSIS — M25512 Pain in left shoulder: Secondary | ICD-10-CM

## 2022-12-15 NOTE — Patient Instructions (Addendum)
TRY TO USE TYLENOL AND ADVIL FOR PAIN ALONG WITH ICE AND OR HEAT AND YOUR SLING   THE MORE OPIOID YOU USE THE HARDER IT WILL BE TO CONTROL PAIN IF YOU NEED SURGERY AND THE SURGERY MAY HAVE TO BE DELAYED   OOW NOTE IF NEEDED   While we are working on your approval for MRI please go ahead and call to schedule your appointment with [facility] within at least one (1) week.   Central Scheduling [phone]

## 2022-12-15 NOTE — Progress Notes (Signed)
Chief Complaint  Patient presents with   New Patient (Initial Visit)   Shoulder Pain    LT shoulder DOI 12/09/22  fall States he can not lift his arm. It is "glued" to his side    HPI: This is a 56 year old male who presents with acute pain left shoulder after falling onto his elbow.  He did have an x-ray of the humerus there was no fracture.  The patient presents with pain anterior aspect left shoulder with tightness and a pulling sensation when he tries to lift his arm.  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)    Wrist pain     BP (!) 153/88   Pulse 75   Ht '5\' 5"'$  (1.651 m)   Wt 264 lb (119.7 kg)   BMI 43.93 kg/m    General appearance: Well-developed well-nourished no gross deformities  Cardiovascular normal pulse and perfusion normal color without edema  Neurologically no sensation loss or deficits or pathologic reflexes  Psychological: Awake alert and oriented x3 mood and affect normal  Skin no lacerations or ulcerations no nodularity no palpable masses, no erythema or nodularity  Musculoskeletal:  Ambulatory without assistive devices arm is in a sling  Left shoulder external rotation with the arm at the side is 45 degrees Tenderness is at the anterolateral corner of the acromion no posterior tenderness no cervical spine tenderness  Painful abduction and flexion passively  0 out of 5 abduction 0 out of 5 flexion terms of rotator cuff    Imaging the humerus x-ray was done at an outside facility.   After thorough review it shows no fracture of the humerus there is suggestion of proximal humeral migration and shortening of the acromiohumeral distance  A/P  Repeat plain film  Encounter Diagnoses  Name Primary?   Acute pain of left shoulder Yes   Traumatic complete tear of left rotator cuff, initial encounter    Clinically he has a rotator cuff tear.  He should have an MRI to rule out rotator cuff tear and if not plan for surgery  He should continue the sling  Use Tylenol heat ice and Advil as needed along with minimal use of the weights  Follow-up after MRI left shoulder

## 2022-12-18 ENCOUNTER — Ambulatory Visit
Admission: RE | Admit: 2022-12-18 | Discharge: 2022-12-18 | Disposition: A | Discharge status: Home / Self Care | Payer: Federal, State, Local not specified - PPO | Source: Ambulatory Visit | Attending: Orthopedic Surgery | Admitting: Orthopedic Surgery

## 2022-12-18 DIAGNOSIS — M25512 Pain in left shoulder: Secondary | ICD-10-CM | POA: Diagnosis not present

## 2022-12-18 DIAGNOSIS — S46012A Strain of muscle(s) and tendon(s) of the rotator cuff of left shoulder, initial encounter: Secondary | ICD-10-CM | POA: Diagnosis not present

## 2022-12-18 DIAGNOSIS — M19012 Primary osteoarthritis, left shoulder: Secondary | ICD-10-CM | POA: Diagnosis not present

## 2022-12-18 DIAGNOSIS — S43082A Other subluxation of left shoulder joint, initial encounter: Secondary | ICD-10-CM | POA: Diagnosis not present

## 2022-12-18 DIAGNOSIS — M25412 Effusion, left shoulder: Secondary | ICD-10-CM | POA: Diagnosis not present

## 2022-12-22 ENCOUNTER — Encounter: Payer: Self-pay | Admitting: Orthopedic Surgery

## 2022-12-22 ENCOUNTER — Ambulatory Visit: Payer: Federal, State, Local not specified - PPO | Admitting: Orthopedic Surgery

## 2022-12-22 DIAGNOSIS — S46012D Strain of muscle(s) and tendon(s) of the rotator cuff of left shoulder, subsequent encounter: Secondary | ICD-10-CM

## 2022-12-22 NOTE — Progress Notes (Signed)
Chief Complaint  Patient presents with   Results    Review MRI left shoulder    (12/15/2022) HPI: This is a 57 year old male who presents with acute pain left shoulder after falling onto his elbow.  He did have an x-ray of the humerus there was no fracture.  The patient presents with pain anterior aspect left shoulder with tightness and a pulling sensation when he tries to lift his arm.  He essentially does not have any active abduction or flexion and pain with passive range of motion   Current medications are Flexeril 10 mg 3 times a day diclofenac 75 mg twice a day hydrocodone 1 tablet 5 mg 325 mg 2 times a day   Today  The patient complains of lack of use of his left arm he cannot lift it or move the arm away from his body.  He has not having pain at rest just when he moves the arm  I reviewed his MRI he has a massive rotator cuff tear the humeral head is subluxated its proximal and posteriorly subluxated   I do not think this is amenable to repair and recommend consultation to consider reverse total shoulder replacement  Patient to see Dr. Amedeo Kinsman for surgical consult  MRI report  Encounter Diagnosis  Name Primary?   Traumatic complete tear of left rotator cuff, subsequent encounter Yes    IMPRESSION: 1. Massive, full-thickness rotator cuff tear as described. The supraspinatus and infraspinatus tendons are completely torn and moderately retracted. The superior fibers of the subscapularis tendon are also torn. No focal rotator cuff muscular atrophy. 2. Partial tear of the long head of the biceps tendon within the bicipital groove. 3. High-riding humeral head with posterior subluxation of the humeral head relative to the glenoid. Associated posterior and superior labral degeneration. 4. Moderate acromioclavicular degenerative changes.     Electronically Signed   By: Richardean Sale M.D.   On: 12/22/2022 11:02

## 2022-12-24 ENCOUNTER — Other Ambulatory Visit: Payer: Self-pay | Admitting: Family Medicine

## 2022-12-24 ENCOUNTER — Other Ambulatory Visit: Payer: Self-pay

## 2022-12-24 ENCOUNTER — Encounter: Payer: Self-pay | Admitting: Family Medicine

## 2022-12-24 DIAGNOSIS — I10 Essential (primary) hypertension: Secondary | ICD-10-CM

## 2022-12-24 DIAGNOSIS — G8929 Other chronic pain: Secondary | ICD-10-CM

## 2022-12-24 MED ORDER — DICLOFENAC SODIUM 75 MG PO TBEC: 75.0000 mg | DELAYED_RELEASE_TABLET | Freq: Two times a day (BID) | ORAL | 1 refills | Status: DC

## 2022-12-24 MED ORDER — HYDROCHLOROTHIAZIDE 25 MG PO TABS: 25.0000 mg | ORAL_TABLET | Freq: Every day | ORAL | 0 refills | Status: DC

## 2022-12-24 MED ORDER — CYCLOBENZAPRINE HCL 10 MG PO TABS: 10.0000 mg | ORAL_TABLET | Freq: Three times a day (TID) | ORAL | 0 refills | Status: DC | PRN

## 2022-12-24 MED ORDER — BUTALBITAL-APAP-CAFFEINE 50-325-40 MG PO TABS: 1.0000 | ORAL_TABLET | ORAL | 0 refills | Status: AC | PRN

## 2022-12-24 MED ORDER — DICLOFENAC SODIUM 75 MG PO TBEC: 75.0000 mg | DELAYED_RELEASE_TABLET | Freq: Two times a day (BID) | ORAL | 0 refills | Status: DC

## 2022-12-24 MED ORDER — HYDROCHLOROTHIAZIDE 25 MG PO TABS: 25.0000 mg | ORAL_TABLET | Freq: Every day | ORAL | 3 refills | Status: DC

## 2022-12-24 NOTE — Telephone Encounter (Signed)
Requested Prescriptions  Pending Prescriptions Disp Refills   amLODipine-benazepril (LOTREL) 5-20 MG capsule [Pharmacy Med Name: AMLODIPINE/BENAZ 5/'20MG'$  CAPS] 180 capsule 1    Sig: TAKE (2) CAPSULES BY MOUTH ONCE EVERY MORNING.     Cardiovascular: CCB + ACEI Combos Failed - 12/24/2022  1:11 PM      Failed - Last BP in normal range    BP Readings from Last 1 Encounters:  12/15/22 (!) 153/88         Failed - Valid encounter within last 6 months    Recent Outpatient Visits           9 months ago Chronic pain of right knee   [place] Susy Frizzle, MD   1 year ago Actinic keratosis   [place] Schaumann, Cammie Mcgee, MD   1 year ago Metabolic syndrome   [place] Susy Frizzle, MD   2 years ago General medical exam   [place] Susy Frizzle, MD   2 years ago Migraine without aura and with status migrainosus, not intractable   [place], Cammie Mcgee, MD       Future Appointments             In 3 days [name], MD [place]   In 8 months Ward Givens, NP [facility]   In 11 months Dennard Schaumann, Cammie Mcgee, MD [place]            Passed - Cr in normal range and within 180 days    Creat  Date Value Ref Range Status  11/18/2022 0.97 0.70 - 1.30 mg/dL Final         Passed - K in normal range and within 180 days    Potassium  Date Value Ref Range Status  11/18/2022 4.0 3.5 - 5.3 mmol/L Final         Passed - Na in normal range and within 180 days    Sodium  Date Value Ref Range Status  11/18/2022 140 135 - 146 mmol/L Final         Passed - eGFR is 30 or above and within 180 days    GFR, Est African American  Date Value Ref Range Status  11/18/2020 97 > OR = 60 mL/min/1.16m Final   GFR, Est Non African American  Date Value Ref Range Status  11/18/2020 84 > OR = 60 mL/min/1.738mFinal   eGFR   Date Value Ref Range Status  11/18/2022 90 > OR = 60 mL/min/1.7349minal         Passed - Patient is not pregnant

## 2022-12-27 ENCOUNTER — Ambulatory Visit: Payer: Federal, State, Local not specified - PPO | Admitting: Orthopedic Surgery

## 2022-12-27 ENCOUNTER — Encounter: Payer: Self-pay | Admitting: Orthopedic Surgery

## 2022-12-27 DIAGNOSIS — S46012D Strain of muscle(s) and tendon(s) of the rotator cuff of left shoulder, subsequent encounter: Secondary | ICD-10-CM | POA: Diagnosis not present

## 2022-12-27 NOTE — Progress Notes (Signed)
New Patient Visit  Assessment: Brian Newton is a 57 y.o. RHD male with the following: Massive left rotator cuff tear  Plan: Rush Farmer Umholtz has a massive left rotator cuff tear.  There is involvement of the supraspinatus and the infraspinatus, with retraction.  He also has tear of the superior aspect of the subscapularis.  Onset of recent pain and decreased function is less than 3 weeks ago.  He is adamant that he had no issues in this left shoulder prior to his recent event.  He is unable to get his left arm above the level of his shoulder.  He has some pain with certain motion of the shoulder, but is not complaining of significant pain.  He does have significant restrictions in his activities.  We discussed multiple options, as well as multiple concerning features of his injury.  We discussed rotator cuff repair, as well as augmentation with a patch or SCR, as well as proceeding with left reverse shoulder arthroplasty.  All questions were answered.  It is possible that his function could improve, as he states he has noticed some improvement since the onset of his injury.  Nonetheless, I think he will benefit from a procedure, and we will continue to discuss what will most likely be the best plan for him surgically.  In addition, I will review his prior MRI, which was completed at [facility].  Sling as needed.  Medicines as needed.  Follow-up in 2 weeks to discuss procedure in more detail.  Follow-up: Return in about 2 weeks (around 01/10/2023).  Subjective:  Chief Complaint  Patient presents with   Shoulder Pain    LT shoulder/ traumatic complete tear of LT rotator cuff Referred by Dr Aline Brochure Discuss surgery Patient fell on 12/09/22    History of Present Illness: Brian Newton is a 57 y.o. male who presents for evaluation of pain.  He was initially evaluated by Dr. Aline Brochure.  On 1221, he was moving some furniture.  The furniture dolly slipped, and he fell.  He landed directly  on his left elbow.  He had immediate pain and essentially no function of his left shoulder.  He immediately presented to an urgent care facility.  He has subsequently followed up with Dr. Aline Brochure.  MRI has been completed, which demonstrates a large rotator cuff tear.  Prior to falling a few weeks ago, he states he had minimal problems with his left shoulder.  He reports a flare of shoulder pain approximately 3 years ago.  At that time, he was treated conservatively, and he did well with therapy and injection.  After that, he has had no issues until recently.  Since the fall, he states he has some pain, but is not taking any medicines on a consistent basis.  He is reporting that he has very little function of his left shoulder.  He is in a sling at times, but does not wear a sling at home.  He has difficulty getting his arm over his head.   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)    Wrist pain     Past Surgical History:  Procedure Laterality Date   APPENDECTOMY  1986   BREATH TEK H PYLORI N/A 08/08/2014   Procedure: BREATH TEK H PYLORI;  Surgeon: Edward Jolly, MD;  Location: [facility];  Service: General;  Laterality: N/A;   COLONOSCOPY N/A 03/02/2013   Procedure: COLONOSCOPY;  Surgeon: Beryle Beams, MD;  Location: [facility];  Service: Endoscopy;  Laterality: N/A;   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   Procedure: LAPAROSCOPIC ROUX-EN-Y GASTRIC BYPASS WITH UPPER ENDOSCOPY;  Surgeon: Excell Seltzer, MD;  Location: [facility];  Service: General;  Laterality: N/A;    KNEE ARTHROSCOPY  2001   rt knee   UPPER GI ENDOSCOPY  11/05/2014   Procedure: UPPER GI ENDOSCOPY;  Surgeon: Excell Seltzer, MD;  Location: [facility];  Service: General;;    Family History  Problem Relation Age of Onset   Hypertension Mother    Hypertension Other    Hyperlipidemia Other    Heart disease Other    Obesity Other    Sleep apnea Other    Breast cancer Maternal Grandmother    Colon cancer Maternal Grandfather    Social History   Tobacco Use   Smoking status: Never   Smokeless tobacco: Never  Substance Use Topics   Alcohol use: No    Alcohol/week: 0.0 standard drinks of alcohol   Drug use: No    Allergies  Allergen Reactions   Penicillins Rash    Current Meds  Medication Sig   amLODipine-benazepril (LOTREL) 5-20 MG capsule TAKE (2) CAPSULES BY MOUTH ONCE EVERY MORNING.   butalbital-acetaminophen-caffeine (FIORICET) 50-325-40 MG tablet Take 1 tablet by mouth every 4 (four) hours as needed for headache.   clotrimazole-betamethasone (LOTRISONE) cream APPLY TO THE AFFECTED AREA TOPICALLY TWICE DAILY.   cyclobenzaprine (FLEXERIL) 10 MG tablet Take 1 tablet (10 mg total) by mouth 3 (three) times daily as needed.   diclofenac (VOLTAREN) 75 MG EC tablet Take 1 tablet (75 mg total) by mouth 2 (two) times daily.   diclofenac (VOLTAREN) 75 MG EC tablet Take 1 tablet (75 mg total) by mouth 2 (two) times daily.   ferrous sulfate 325 (65 FE) MG tablet Take 325 mg by mouth daily with breakfast.   hydrochlorothiazide (HYDRODIURIL) 25 MG tablet Take 1 tablet (25 mg total) by mouth daily.   hydrochlorothiazide (HYDRODIURIL) 25 MG tablet Take 1 tablet (25 mg total) by mouth daily.   HYDROcodone-acetaminophen (NORCO/VICODIN) 5-325 MG tablet Take 1 tablet by mouth 2 (two) times daily as needed for moderate pain.   loratadine (CLARITIN) 10 MG tablet Take 10 mg by mouth every morning.   Multiple Vitamins-Minerals (MULTIVITAMIN WITH MINERALS) tablet Take 1 tablet by mouth every  morning.    OVER THE COUNTER MEDICATION Calcium - '600mg'$  - vitamin d 1500units - magnesium - '120mg'$  - 2 tablets bid   OVER THE COUNTER MEDICATION Iron - '54mg'$  - vitamn c '90mg'$  - 1 qam - 2 pm    Objective: There were no vitals taken for this visit.  Physical Exam:  General: Alert and oriented. and No acute distress. Gait: Normal gait.  Left shoulder without deformity.  No point tenderness.  Forward flexion limited to 90 degrees.  Passively I can get him to 140 degrees.  Positive drop arm test.  Positive Jobe's.  4/5 strength in the supraspinatus and infraspinatus.  4/5 strength in the subscapularis.  Internal rotation to the side of  his hip.  Fingers are warm and well-perfused.  IMAGING: I personally reviewed images previously obtained in clinic   Left shoulder MRI.   IMPRESSION: 1. Massive, full-thickness rotator cuff tear as described. The supraspinatus and infraspinatus tendons are completely torn and moderately retracted. The superior fibers of the subscapularis tendon are also torn. No focal rotator cuff muscular atrophy. 2. Partial tear of the long head of the biceps tendon within the bicipital groove. 3. High-riding humeral head with posterior subluxation of the humeral head relative to the glenoid. Associated posterior and superior labral degeneration. 4. Moderate acromioclavicular degenerative changes.    New Medications:  No orders of the defined types were placed in this encounter.     [name], MD  12/27/2022 11:43 AM

## 2023-01-02 ENCOUNTER — Encounter: Payer: Self-pay | Admitting: Orthopedic Surgery

## 2023-01-04 ENCOUNTER — Encounter: Payer: Self-pay | Admitting: Orthopedic Surgery

## 2023-01-04 ENCOUNTER — Ambulatory Visit: Payer: Federal, State, Local not specified - PPO | Admitting: Orthopedic Surgery

## 2023-01-04 VITALS — Ht 65.0 in | Wt 264.0 lb

## 2023-01-04 DIAGNOSIS — Z01818 Encounter for other preprocedural examination: Secondary | ICD-10-CM | POA: Diagnosis not present

## 2023-01-04 DIAGNOSIS — S46012D Strain of muscle(s) and tendon(s) of the rotator cuff of left shoulder, subsequent encounter: Secondary | ICD-10-CM

## 2023-01-04 NOTE — Progress Notes (Signed)
Return patient Visit  Assessment: Brian Newton is a 57 y.o. RHD male with the following: Massive left rotator cuff tear  Plan: Brian Newton has a massive left rotator cuff tear.  There is full-thickness tearing of the supraspinatus, infraspinatus with retraction.  There is also full-thickness tearing of the superior aspect of the subscapularis.  These injuries do appear to be new.  MRI from 3 years ago was without injury.  As such, we will attempt to repair his massive rotator cuff tear.  This may require patch augmentation, or even superior capsular reconstruction.  This was discussed with the patient.  Risks and benefits of the surgery, including, but not limited to infection, bleeding, persistent pain, need for further surgery, shoulder stiffness and more severe complications associated with anesthesia were discussed with the patient.  The patient has elected to proceed.  We have already obtained medical clearance from Dr. Dennard Schaumann.  Will plan to proceed with surgery 01/13/2023.  Surgery to include left shoulder arthroscopy, debridement, rotator cuff repair of the supraspinatus, infraspinatus and subscapularis.  Possible superior capsular reconstruction.  Possible patch augmentation of the repair.    Follow-up: Return in about 3 weeks (around 01/25/2023) for After surgery; DOS 01/13/23.  Subjective:  Chief Complaint  Patient presents with   Shoulder Pain    Lt shoulder, discuss surgery    History of Present Illness: Brian Newton is a 58 y.o. male who returns for evaluation of left shoulder pain.  He sustained an injury approximately 4 weeks ago.  I saw him in clinic last week, and discussed the nature of his injury, and the appearance of the rotator cuff tendons on MRI.  He reports that he had pain in his left shoulder a few years ago.  MRI at that time was positive only for tendinosis.  As such, his current tear is either acute or acute on chronic.  If possible, he would  like to repair the existing tendons.  Review of Systems: No fevers or chills No numbness or tingling No chest pain No shortness of breath No bowel or bladder dysfunction No GI distress No headaches    Objective: Ht '5\' 5"'$  (1.651 m)   Wt 264 lb (119.7 kg)   BMI 43.93 kg/m   Physical Exam:  General: Alert and oriented. and No acute distress. Gait: Normal gait.  Left shoulder without deformity.  No point tenderness.  Forward flexion limited to 90 degrees.  Passively I can get him to 140 degrees.  Positive drop arm test.  Positive Jobe's.  4/5 strength in the supraspinatus and infraspinatus.  4/5 strength in the subscapularis.  Internal rotation to the side of his hip.  Fingers are warm and well-perfused.  IMAGING: I personally reviewed images previously obtained in clinic   Left shoulder MRI.   IMPRESSION: 1. Massive, full-thickness rotator cuff tear as described. The supraspinatus and infraspinatus tendons are completely torn and moderately retracted. The superior fibers of the subscapularis tendon are also torn. No focal rotator cuff muscular atrophy. 2. Partial tear of the long head of the biceps tendon within the bicipital groove. 3. High-riding humeral head with posterior subluxation of the humeral head relative to the glenoid. Associated posterior and superior labral degeneration. 4. Moderate acromioclavicular degenerative changes.    New Medications:  No orders of the defined types were placed in this encounter.     [name], MD  01/04/2023 1:02 PM

## 2023-01-04 NOTE — H&P (View-Only) (Signed)
Return patient Visit  Assessment: Brian Newton is a 57 y.o. RHD male with the following: Massive left rotator cuff tear  Plan: Rush Farmer Mcnabb has a massive left rotator cuff tear.  There is full-thickness tearing of the supraspinatus, infraspinatus with retraction.  There is also full-thickness tearing of the superior aspect of the subscapularis.  These injuries do appear to be new.  MRI from 3 years ago was without injury.  As such, we will attempt to repair his massive rotator cuff tear.  This may require patch augmentation, or even superior capsular reconstruction.  This was discussed with the patient.  Risks and benefits of the surgery, including, but not limited to infection, bleeding, persistent pain, need for further surgery, shoulder stiffness and more severe complications associated with anesthesia were discussed with the patient.  The patient has elected to proceed.  We have already obtained medical clearance from Dr. Dennard Schaumann.  Will plan to proceed with surgery 01/13/2023.  Surgery to include left shoulder arthroscopy, debridement, rotator cuff repair of the supraspinatus, infraspinatus and subscapularis.  Possible superior capsular reconstruction.  Possible patch augmentation of the repair.    Follow-up: Return in about 3 weeks (around 01/25/2023) for After surgery; DOS 01/13/23.  Subjective:  Chief Complaint  Patient presents with   Shoulder Pain    Lt shoulder, discuss surgery    History of Present Illness: Brian Newton is a 57 y.o. male who returns for evaluation of left shoulder pain.  He sustained an injury approximately 4 weeks ago.  I saw him in clinic last week, and discussed the nature of his injury, and the appearance of the rotator cuff tendons on MRI.  He reports that he had pain in his left shoulder a few years ago.  MRI at that time was positive only for tendinosis.  As such, his current tear is either acute or acute on chronic.  If possible, he would  like to repair the existing tendons.  Review of Systems: No fevers or chills No numbness or tingling No chest pain No shortness of breath No bowel or bladder dysfunction No GI distress No headaches    Objective: Ht '5\' 5"'$  (1.651 m)   Wt 264 lb (119.7 kg)   BMI 43.93 kg/m   Physical Exam:  General: Alert and oriented. and No acute distress. Gait: Normal gait.  Left shoulder without deformity.  No point tenderness.  Forward flexion limited to 90 degrees.  Passively I can get him to 140 degrees.  Positive drop arm test.  Positive Jobe's.  4/5 strength in the supraspinatus and infraspinatus.  4/5 strength in the subscapularis.  Internal rotation to the side of his hip.  Fingers are warm and well-perfused.  IMAGING: I personally reviewed images previously obtained in clinic   Left shoulder MRI.   IMPRESSION: 1. Massive, full-thickness rotator cuff tear as described. The supraspinatus and infraspinatus tendons are completely torn and moderately retracted. The superior fibers of the subscapularis tendon are also torn. No focal rotator cuff muscular atrophy. 2. Partial tear of the long head of the biceps tendon within the bicipital groove. 3. High-riding humeral head with posterior subluxation of the humeral head relative to the glenoid. Associated posterior and superior labral degeneration. 4. Moderate acromioclavicular degenerative changes.    New Medications:  No orders of the defined types were placed in this encounter.     Brian Rasmussen, MD  01/04/2023 1:02 PM

## 2023-01-06 NOTE — Patient Instructions (Signed)
Brian Newton  01/06/2023     '@PREFPERIOPPHARMACY'$ @   Your procedure is scheduled on  01/13/2023.   Report to [facility] at  0600  A.M.   Call this number if you have problems the morning of surgery:  [phone]  If you experience any cold or flu symptoms such as cough, fever, chills, shortness of breath, etc. between now and your scheduled surgery, please notify us at the above number.   Remember:  Do not eat after midnight.   You may drink clear liquids until 0330 am on 01/13/2023.        Clear liquids allowed are:                    Water, Juice (No red color; non-citric and without pulp; diabetics please choose diet or no sugar options), Carbonated beverages (diabetics please choose diet or no sugar options), Clear Tea (No creamer, milk, or cream, including half & half and powdered creamer), Black Coffee Only (No creamer, milk or cream, including half & half and powdered creamer), Plain Jell-O Only (No red color; diabetics please choose no sugar options), Clear Sports drink (No red color; diabetics please choose diet or no sugar options), and Plain Popsicles Only (No red color; diabetics please choose no sugar options)           At 0330 am on 01/13/2023 drink your carb drink. You can have nothing else to drink after this.    Take these medicines the morning of surgery with A SIP OF WATER            fioricet(if needed), flexeril(if needed), claritin.     Do not wear jewelry, make-up or nail polish.  Do not wear lotions, powders, or perfumes, or deodorant.  Do not shave 48 hours prior to surgery.  Men may shave face and neck.  Do not bring valuables to the hospital.  [facility] is not responsible for any belongings or valuables.  Contacts, dentures or bridgework may not be worn into surgery.  Leave your suitcase in the car.  After surgery it may be brought to your room.  For patients admitted to the hospital, discharge time will be determined by your  treatment team.  Patients discharged the day of surgery will not be allowed to drive home and must have someone with them for 24 hours.     Special instructions:   DO NOT smoke tobacco or vape for 24 hours before your procedure.  Please read over the following fact sheets that you were given. Pain Booklet, Coughing and Deep Breathing, Surgical Site Infection Prevention, Anesthesia Post-op Instructions, and Care and Recovery After Surgery      Surgery for Rotator Cuff Tear, Care After This sheet gives you information about how to care for yourself after your procedure. Your health care provider may also give you more specific instructions. If you have problems or questions, contact your health care provider. What can I expect after the procedure? After the procedure, it is common to have: Redness. Swelling. A small amount of fluid or blood in the incision area. Pain. Stiffness. Follow these instructions at home: If you have a sling or a shoulder immobilizer: Wear it as told by your health care provider. Remove it only as told by your health care provider. Loosen it if your fingers tingle, become numb, or turn cold and blue. Keep it clean and dry. Bathing Do not take baths, swim,  or use a hot tub until your health care provider approves. Ask your health care provider if you may take showers. You may only be allowed to take sponge baths. Keep your bandage (dressing) dry until your health care provider says it can be removed. If your sling or shoulder immobilizer is not waterproof: Do not let it get wet. Remove it when you take a bath or shower as told by your health care provider. Once the sling or shoulder immobilizer is removed, try not to move your shoulder until your health care provider says that you can. Incision care  Follow instructions from your health care provider about how to take care of your incision. Make sure you: Wash your hands with soap and water for at least 20  seconds before and after you change your dressing. If soap and water are not available, use hand sanitizer. Change your dressing as told by your health care provider. Leave stitches (sutures), skin glue, or adhesive strips in place. These skin closures may need to stay in place for 2 weeks or longer. If adhesive strip edges start to loosen and curl up, you may trim the loose edges. Do not remove adhesive strips completely unless your health care provider tells you to do that. Check your incision area every day for signs of infection. Check for: More redness, swelling, or pain. More fluid or blood. Warmth. Pus or a bad smell. Managing pain, stiffness, and swelling  If directed, put ice on your shoulder area. To do this: Put ice in a plastic bag. Place a towel between your skin and the bag. Leave the ice on for 20 minutes, 2-3 times a day. Remove the ice if your skin turns bright red. This is very important. If you cannot feel pain, heat, or cold, you have a greater risk of damage to the area. Move your fingers often to reduce stiffness and swelling. Raise (elevate) your upper body on pillows when you lie down and when you sleep. Do not sleep on the front of your body (abdomen). Do not sleep on the side that your surgery was performed on. Medicines Take over-the-counter and prescription medicines only as told by your health care provider. Ask your health care provider if the medicine prescribed to you: Requires you to avoid driving or using machinery. Can cause constipation. You may need to take these actions to prevent or treat constipation: Drink enough fluid to keep your urine pale yellow. Take over-the-counter or prescription medicines. Eat foods that are high in fiber, such as beans, whole grains, and fresh fruits and vegetables. Limit foods that are high in fat and processed sugars, such as fried or sweet foods. Driving If you were given a sedative during the procedure, it can affect  you for several hours. Do not drive or operate machinery until your health care provider says that it is safe. Do not drive while wearing a sling or a shoulder immobilizer. Ask your health care provider when it is safe to drive. Activity Do not use your arm to support your body weight until your health care provider says that you can. Do not lift or hold anything with your arm until your health care provider approves. Return to your normal activities as told by your health care provider. Ask your health care provider what activities are safe for you. Do exercises as told by your health care provider. General instructions Do not use any products that contain nicotine or tobacco, such as cigarettes, e-cigarettes, and chewing tobacco.  These can delay healing after surgery. If you need help quitting, ask your health care provider. Keep all follow-up visits. This is important. Contact a health care provider if: You have a fever or chills. You have more redness, swelling, or pain around your incision. You have more fluid or blood coming from your incision. Your incision feels warm to the touch. You have pus or a bad smell coming from your incision. You have pain that gets worse or does not get better with medicine. Get help right away if: You have severe pain. You lose feeling in your arm or hand. Your hand or fingers turn very pale or blue. Summary If you have a sling or shoulder immobilizer, wear it as told by your health care provider. Remove it only as told by your health care provider. Change your dressing as told by your health care provider. Check the incision area every day for signs of infection. If directed, put ice on your shoulder area 2-3 times a day. Do not use your arm to lift anything or to support your body weight until your health care provider says that you can. This information is not intended to replace advice given to you by your health care provider. Make sure you discuss  any questions you have with your health care provider. Document Revised: 04/09/2020 Document Reviewed: 04/09/2020 Elsevier Patient Education  [place] Anesthesia, Adult, Care After The following information offers guidance on how to care for yourself after your procedure. Your health care provider may also give you more specific instructions. If you have problems or questions, contact your health care provider. What can I expect after the procedure? After the procedure, it is common for people to: Have pain or discomfort at the IV site. Have nausea or vomiting. Have a sore throat or hoarseness. Have trouble concentrating. Feel cold or chills. Feel weak, sleepy, or tired (fatigue). Have soreness and body aches. These can affect parts of the body that were not involved in surgery. Follow these instructions at home: For the time period you were told by your health care provider:  Rest. Do not participate in activities where you could fall or become injured. Do not drive or use machinery. Do not drink alcohol. Do not take sleeping pills or medicines that cause drowsiness. Do not make important decisions or sign legal documents. Do not take care of children on your own. General instructions Drink enough fluid to keep your urine pale yellow. If you have sleep apnea, surgery and certain medicines can increase your risk for breathing problems. Follow instructions from your health care provider about wearing your sleep device: Anytime you are sleeping, including during daytime naps. While taking prescription pain medicines, sleeping medicines, or medicines that make you drowsy. Return to your normal activities as told by your health care provider. Ask your health care provider what activities are safe for you. Take over-the-counter and prescription medicines only as told by your health care provider. Do not use any products that contain nicotine or tobacco. These products  include cigarettes, chewing tobacco, and vaping devices, such as e-cigarettes. These can delay incision healing after surgery. If you need help quitting, ask your health care provider. Contact a health care provider if: You have nausea or vomiting that does not get better with medicine. You vomit every time you eat or drink. You have pain that does not get better with medicine. You cannot urinate or have bloody urine. You develop a skin rash. You have a  fever. Get help right away if: You have trouble breathing. You have chest pain. You vomit blood. These symptoms may be an emergency. Get help right away. Call 911. Do not wait to see if the symptoms will go away. Do not drive yourself to the hospital. Summary After the procedure, it is common to have a sore throat, hoarseness, nausea, vomiting, or to feel weak, sleepy, or fatigue. For the time period you were told by your health care provider, do not drive or use machinery. Get help right away if you have difficulty breathing, have chest pain, or vomit blood. These symptoms may be an emergency. This information is not intended to replace advice given to you by your health care provider. Make sure you discuss any questions you have with your health care provider. Document Revised: 03/05/2022 Document Reviewed: 03/05/2022 Elsevier Patient Education  [place]. How to Use Chlorhexidine Before Surgery Chlorhexidine gluconate (CHG) is a germ-killing (antiseptic) solution that is used to clean the skin. It can get rid of the bacteria that normally live on the skin and can keep them away for about 24 hours. To clean your skin with CHG, you may be given: A CHG solution to use in the shower or as part of a sponge bath. A prepackaged cloth that contains CHG. Cleaning your skin with CHG may help lower the risk for infection: While you are staying in the intensive care unit of the hospital. If you have a vascular access, such as a central  line, to provide short-term or long-term access to your veins. If you have a catheter to drain urine from your bladder. If you are on a ventilator. A ventilator is a machine that helps you breathe by moving air in and out of your lungs. After surgery. What are the risks? Risks of using CHG include: A skin reaction. Hearing loss, if CHG gets in your ears and you have a perforated eardrum. Eye injury, if CHG gets in your eyes and is not rinsed out. The CHG product catching fire. Make sure that you avoid smoking and flames after applying CHG to your skin. Do not use CHG: If you have a chlorhexidine allergy or have previously reacted to chlorhexidine. On babies younger than 31 months of age. How to use CHG solution Use CHG only as told by your health care provider, and follow the instructions on the label. Use the full amount of CHG as directed. Usually, this is one bottle. During a shower Follow these steps when using CHG solution during a shower (unless your health care provider gives you different instructions): Start the shower. Use your normal soap and shampoo to wash your face and hair. Turn off the shower or move out of the shower stream. Pour the CHG onto a clean washcloth. Do not use any type of brush or rough-edged sponge. Starting at your neck, lather your body down to your toes. Make sure you follow these instructions: If you will be having surgery, pay special attention to the part of your body where you will be having surgery. Scrub this area for at least 1 minute. Do not use CHG on your head or face. If the solution gets into your ears or eyes, rinse them well with water. Avoid your genital area. Avoid any areas of skin that have broken skin, cuts, or scrapes. Scrub your back and under your arms. Make sure to wash skin folds. Let the lather sit on your skin for 1-2 minutes or as long as  told by your health care provider. Thoroughly rinse your entire body in the shower. Make  sure that all body creases and crevices are rinsed well. Dry off with a clean towel. Do not put any substances on your body afterward--such as powder, lotion, or perfume--unless you are told to do so by your health care provider. Only use lotions that are recommended by the manufacturer. Put on clean clothes or pajamas. If it is the night before your surgery, sleep in clean sheets.  During a sponge bath Follow these steps when using CHG solution during a sponge bath (unless your health care provider gives you different instructions): Use your normal soap and shampoo to wash your face and hair. Pour the CHG onto a clean washcloth. Starting at your neck, lather your body down to your toes. Make sure you follow these instructions: If you will be having surgery, pay special attention to the part of your body where you will be having surgery. Scrub this area for at least 1 minute. Do not use CHG on your head or face. If the solution gets into your ears or eyes, rinse them well with water. Avoid your genital area. Avoid any areas of skin that have broken skin, cuts, or scrapes. Scrub your back and under your arms. Make sure to wash skin folds. Let the lather sit on your skin for 1-2 minutes or as long as told by your health care provider. Using a different clean, wet washcloth, thoroughly rinse your entire body. Make sure that all body creases and crevices are rinsed well. Dry off with a clean towel. Do not put any substances on your body afterward--such as powder, lotion, or perfume--unless you are told to do so by your health care provider. Only use lotions that are recommended by the manufacturer. Put on clean clothes or pajamas. If it is the night before your surgery, sleep in clean sheets. How to use CHG prepackaged cloths Only use CHG cloths as told by your health care provider, and follow the instructions on the label. Use the CHG cloth on clean, dry skin. Do not use the CHG cloth on your  head or face unless your health care provider tells you to. When washing with the CHG cloth: Avoid your genital area. Avoid any areas of skin that have broken skin, cuts, or scrapes. Before surgery Follow these steps when using a CHG cloth to clean before surgery (unless your health care provider gives you different instructions): Using the CHG cloth, vigorously scrub the part of your body where you will be having surgery. Scrub using a back-and-forth motion for 3 minutes. The area on your body should be completely wet with CHG when you are done scrubbing. Do not rinse. Discard the cloth and let the area air-dry. Do not put any substances on the area afterward, such as powder, lotion, or perfume. Put on clean clothes or pajamas. If it is the night before your surgery, sleep in clean sheets.  For general bathing Follow these steps when using CHG cloths for general bathing (unless your health care provider gives you different instructions). Use a separate CHG cloth for each area of your body. Make sure you wash between any folds of skin and between your fingers and toes. Wash your body in the following order, switching to a new cloth after each step: The front of your neck, shoulders, and chest. Both of your arms, under your arms, and your hands. Your stomach and groin area, avoiding the genitals. Your right  leg and foot. Your left leg and foot. The back of your neck, your back, and your buttocks. Do not rinse. Discard the cloth and let the area air-dry. Do not put any substances on your body afterward--such as powder, lotion, or perfume--unless you are told to do so by your health care provider. Only use lotions that are recommended by the manufacturer. Put on clean clothes or pajamas. Contact a health care provider if: Your skin gets irritated after scrubbing. You have questions about using your solution or cloth. You swallow any chlorhexidine. Call your local poison control center  (1-385-353-5085 in the [place] Get help right away if: Your eyes itch badly, or they become very red or swollen. Your skin itches badly and is red or swollen. Your hearing changes. You have trouble seeing. You have swelling or tingling in your mouth or throat. You have trouble breathing. These symptoms may represent a serious problem that is an emergency. Do not wait to see if the symptoms will go away. Get medical help right away. Call your local emergency services (911 in the [place] Do not drive yourself to the hospital. Summary Chlorhexidine gluconate (CHG) is a germ-killing (antiseptic) solution that is used to clean the skin. Cleaning your skin with CHG may help to lower your risk for infection. You may be given CHG to use for bathing. It may be in a bottle or in a prepackaged cloth to use on your skin. Carefully follow your health care provider's instructions and the instructions on the product label. Do not use CHG if you have a chlorhexidine allergy. Contact your health care provider if your skin gets irritated after scrubbing. This information is not intended to replace advice given to you by your health care provider. Make sure you discuss any questions you have with your health care provider. Document Revised: 04/05/2022 Document Reviewed: 02/16/2021 Elsevier Patient Education  [place].

## 2023-01-10 ENCOUNTER — Encounter (HOSPITAL_COMMUNITY)
Admission: RE | Admit: 2023-01-10 | Discharge: 2023-01-10 | Disposition: A | Discharge status: Home / Self Care | Payer: Federal, State, Local not specified - PPO | Source: Ambulatory Visit | Attending: Orthopedic Surgery | Admitting: Orthopedic Surgery

## 2023-01-10 ENCOUNTER — Telehealth: Payer: Self-pay | Admitting: Orthopedic Surgery

## 2023-01-10 ENCOUNTER — Ambulatory Visit: Payer: Federal, State, Local not specified - PPO | Admitting: Orthopedic Surgery

## 2023-01-10 ENCOUNTER — Encounter (HOSPITAL_COMMUNITY): Payer: Self-pay

## 2023-01-10 VITALS — BP 132/82 | HR 70 | Temp 97.8°F | Resp 18 | Ht 65.0 in | Wt 264.0 lb

## 2023-01-10 DIAGNOSIS — S46012D Strain of muscle(s) and tendon(s) of the rotator cuff of left shoulder, subsequent encounter: Secondary | ICD-10-CM

## 2023-01-10 DIAGNOSIS — R519 Headache, unspecified: Secondary | ICD-10-CM | POA: Diagnosis not present

## 2023-01-10 DIAGNOSIS — Z01818 Encounter for other preprocedural examination: Secondary | ICD-10-CM | POA: Insufficient documentation

## 2023-01-10 DIAGNOSIS — M1990 Unspecified osteoarthritis, unspecified site: Secondary | ICD-10-CM | POA: Diagnosis not present

## 2023-01-10 DIAGNOSIS — Z6841 Body Mass Index (BMI) 40.0 and over, adult: Secondary | ICD-10-CM | POA: Diagnosis not present

## 2023-01-10 DIAGNOSIS — I10 Essential (primary) hypertension: Secondary | ICD-10-CM | POA: Diagnosis not present

## 2023-01-10 DIAGNOSIS — X58XXXD Exposure to other specified factors, subsequent encounter: Secondary | ICD-10-CM | POA: Insufficient documentation

## 2023-01-10 DIAGNOSIS — M75122 Complete rotator cuff tear or rupture of left shoulder, not specified as traumatic: Secondary | ICD-10-CM | POA: Diagnosis not present

## 2023-01-10 DIAGNOSIS — G4730 Sleep apnea, unspecified: Secondary | ICD-10-CM | POA: Diagnosis not present

## 2023-01-10 LAB — CBC WITH DIFFERENTIAL/PLATELET
Abs Immature Granulocytes: 0.01 10*3/uL (ref 0.00–0.07)
Basophils Absolute: 0 10*3/uL (ref 0.0–0.1)
Basophils Relative: 0 %
Eosinophils Absolute: 0.2 10*3/uL (ref 0.0–0.5)
Eosinophils Relative: 2 %
HCT: 45.7 % (ref 39.0–52.0)
Hemoglobin: 15.1 g/dL (ref 13.0–17.0)
Immature Granulocytes: 0 %
Lymphocytes Relative: 29 %
Lymphs Abs: 2.1 10*3/uL (ref 0.7–4.0)
MCH: 30.4 pg (ref 26.0–34.0)
MCHC: 33 g/dL (ref 30.0–36.0)
MCV: 92 fL (ref 80.0–100.0)
Monocytes Absolute: 1 10*3/uL (ref 0.1–1.0)
Monocytes Relative: 14 %
Neutro Abs: 3.9 10*3/uL (ref 1.7–7.7)
Neutrophils Relative %: 55 %
Platelets: 336 10*3/uL (ref 150–400)
RBC: 4.97 MIL/uL (ref 4.22–5.81)
RDW: 13.4 % (ref 11.5–15.5)
WBC: 7.2 10*3/uL (ref 4.0–10.5)
nRBC: 0 % (ref 0.0–0.2)

## 2023-01-10 LAB — COMPREHENSIVE METABOLIC PANEL
ALT: 34 U/L (ref 0–44)
AST: 26 U/L (ref 15–41)
Albumin: 3.8 g/dL (ref 3.5–5.0)
Alkaline Phosphatase: 68 U/L (ref 38–126)
Anion gap: 9 (ref 5–15)
BUN: 21 mg/dL — ABNORMAL HIGH (ref 6–20)
CO2: 27 mmol/L (ref 22–32)
Calcium: 8.9 mg/dL (ref 8.9–10.3)
Chloride: 99 mmol/L (ref 98–111)
Creatinine, Ser: 1.03 mg/dL (ref 0.61–1.24)
GFR, Estimated: >60 mL/min (ref >60)
Glucose, Bld: 97 mg/dL (ref 70–99)
Potassium: 3.7 mmol/L (ref 3.5–5.1)
Sodium: 135 mmol/L (ref 135–145)
Total Bilirubin: 0.4 mg/dL (ref 0.3–1.2)
Total Protein: 7 g/dL (ref 6.5–8.1)

## 2023-01-10 NOTE — Telephone Encounter (Signed)
SPOKE WITH PATIENT. ADVISED HIM PAIN MEDICATION IS NOT CALLED IN PRIOR TO SURGERY. IT WILL BE CALLED IN TO HIS PHARMACY THE DAY OF. PATIENT VERBALIZED UNDERSTANDING.

## 2023-01-10 NOTE — Telephone Encounter (Signed)
Patient went to his post op appt and asked about getting his pain medicine now and she told him that the doctor has already called it in.  He went to the pharmacy [facility] ) and they said they did not have a prescription for him.  He is wanting to get his pain medicine now before the surgery so he can go straight home and start taking his pain medicine.    Please call him back at [phone]

## 2023-01-13 ENCOUNTER — Other Ambulatory Visit: Payer: Self-pay

## 2023-01-13 ENCOUNTER — Ambulatory Visit (HOSPITAL_COMMUNITY): Payer: Federal, State, Local not specified - PPO | Admitting: Anesthesiology

## 2023-01-13 ENCOUNTER — Encounter (HOSPITAL_COMMUNITY)
Admission: RE | Disposition: A | Discharge status: Home / Self Care | Payer: Self-pay | Source: Home / Self Care | Attending: Orthopedic Surgery

## 2023-01-13 ENCOUNTER — Encounter (HOSPITAL_COMMUNITY): Payer: Self-pay | Admitting: Orthopedic Surgery

## 2023-01-13 ENCOUNTER — Ambulatory Visit (HOSPITAL_COMMUNITY)
Admission: RE | Admit: 2023-01-13 | Discharge: 2023-01-13 | Disposition: A | Discharge status: Home / Self Care | Payer: Federal, State, Local not specified - PPO | Attending: Orthopedic Surgery | Admitting: Orthopedic Surgery

## 2023-01-13 ENCOUNTER — Encounter: Payer: Self-pay | Admitting: Orthopedic Surgery

## 2023-01-13 DIAGNOSIS — G4730 Sleep apnea, unspecified: Secondary | ICD-10-CM | POA: Insufficient documentation

## 2023-01-13 DIAGNOSIS — G8929 Other chronic pain: Secondary | ICD-10-CM

## 2023-01-13 DIAGNOSIS — G8918 Other acute postprocedural pain: Secondary | ICD-10-CM | POA: Diagnosis not present

## 2023-01-13 DIAGNOSIS — R519 Headache, unspecified: Secondary | ICD-10-CM | POA: Insufficient documentation

## 2023-01-13 DIAGNOSIS — I10 Essential (primary) hypertension: Secondary | ICD-10-CM | POA: Insufficient documentation

## 2023-01-13 DIAGNOSIS — S46012A Strain of muscle(s) and tendon(s) of the rotator cuff of left shoulder, initial encounter: Secondary | ICD-10-CM

## 2023-01-13 DIAGNOSIS — M75122 Complete rotator cuff tear or rupture of left shoulder, not specified as traumatic: Secondary | ICD-10-CM | POA: Diagnosis not present

## 2023-01-13 DIAGNOSIS — M1990 Unspecified osteoarthritis, unspecified site: Secondary | ICD-10-CM | POA: Diagnosis not present

## 2023-01-13 DIAGNOSIS — Z6841 Body Mass Index (BMI) 40.0 and over, adult: Secondary | ICD-10-CM | POA: Diagnosis not present

## 2023-01-13 HISTORY — PX: SHOULDER ARTHROSCOPY WITH BICEPSTENOTOMY: SHX6204

## 2023-01-13 HISTORY — PX: OPEN SUBSCAPULARIS REPAIR: SHX6233

## 2023-01-13 HISTORY — PX: ARTHOSCOPIC ROTAOR CUFF REPAIR: SHX5002

## 2023-01-13 SURGERY — REPAIR, ROTATOR CUFF, ARTHROSCOPIC
Anesthesia: General | Site: Shoulder | Laterality: Left

## 2023-01-13 MED ORDER — EPHEDRINE SULFATE-NACL 50-0.9 MG/10ML-% IV SOSY
PREFILLED_SYRINGE | INTRAVENOUS | Status: DC | PRN
  Administered 2023-01-13 (×2): 5 mg via INTRAVENOUS

## 2023-01-13 MED ORDER — PHENYLEPHRINE HCL-NACL 20-0.9 MG/250ML-% IV SOLN
INTRAVENOUS | Status: DC | PRN
  Administered 2023-01-13: 25 ug/min via INTRAVENOUS

## 2023-01-13 MED ORDER — DEXAMETHASONE SODIUM PHOSPHATE 4 MG/ML IJ SOLN
INTRAMUSCULAR | Status: AC
  Filled 2023-01-13: qty 2

## 2023-01-13 MED ORDER — LIDOCAINE HCL (PF) 1 % IJ SOLN
INTRAMUSCULAR | Status: AC
  Filled 2023-01-13: qty 30

## 2023-01-13 MED ORDER — FENTANYL CITRATE (PF) 100 MCG/2ML IJ SOLN
INTRAMUSCULAR | Status: AC
  Filled 2023-01-13: qty 2

## 2023-01-13 MED ORDER — OXYCODONE HCL 5 MG PO TABS: 5.0000 mg | ORAL_TABLET | ORAL | 0 refills | Status: AC | PRN

## 2023-01-13 MED ORDER — PROPOFOL 10 MG/ML IV BOLUS
INTRAVENOUS | Status: DC | PRN
  Administered 2023-01-13: 50 mg via INTRAVENOUS
  Administered 2023-01-13: 200 mg via INTRAVENOUS

## 2023-01-13 MED ORDER — ONDANSETRON HCL 4 MG/2ML IJ SOLN
INTRAMUSCULAR | Status: AC
  Filled 2023-01-13: qty 4

## 2023-01-13 MED ORDER — PHENYLEPHRINE 80 MCG/ML (10ML) SYRINGE FOR IV PUSH (FOR BLOOD PRESSURE SUPPORT)
PREFILLED_SYRINGE | INTRAVENOUS | Status: AC
  Filled 2023-01-13: qty 10

## 2023-01-13 MED ORDER — PROPOFOL 10 MG/ML IV BOLUS
INTRAVENOUS | Status: AC
  Filled 2023-01-13: qty 20

## 2023-01-13 MED ORDER — LACTATED RINGERS IV SOLN
INTRAVENOUS | Status: DC
  Administered 2023-01-13: 1000 mL via INTRAVENOUS

## 2023-01-13 MED ORDER — EPINEPHRINE PF 1 MG/ML IJ SOLN
INTRAMUSCULAR | Status: AC
  Filled 2023-01-13: qty 10

## 2023-01-13 MED ORDER — ROPIVACAINE HCL 5 MG/ML IJ SOLN
INTRAMUSCULAR | Status: AC
  Filled 2023-01-13: qty 30

## 2023-01-13 MED ORDER — CHLORHEXIDINE GLUCONATE 0.12 % MT SOLN
15.0000 mL | Freq: Once | OROMUCOSAL | Status: AC
  Administered 2023-01-13: 15 mL via OROMUCOSAL

## 2023-01-13 MED ORDER — MIDAZOLAM HCL 2 MG/2ML IJ SOLN
2.0000 mg | Freq: Once | INTRAMUSCULAR | Status: AC
  Administered 2023-01-13: 2 mg via INTRAVENOUS
  Filled 2023-01-13: qty 2

## 2023-01-13 MED ORDER — LIDOCAINE HCL (CARDIAC) PF 100 MG/5ML IV SOSY
PREFILLED_SYRINGE | INTRAVENOUS | Status: DC | PRN
  Administered 2023-01-13: 40 mg via INTRATRACHEAL

## 2023-01-13 MED ORDER — DEXAMETHASONE SODIUM PHOSPHATE 10 MG/ML IJ SOLN
INTRAMUSCULAR | Status: AC
  Filled 2023-01-13: qty 2

## 2023-01-13 MED ORDER — ASPIRIN 81 MG PO TBEC: 81.0000 mg | DELAYED_RELEASE_TABLET | Freq: Two times a day (BID) | ORAL | 0 refills | Status: AC

## 2023-01-13 MED ORDER — MEPERIDINE HCL 50 MG/ML IJ SOLN: 6.2500 mg | INTRAMUSCULAR | Status: DC | PRN

## 2023-01-13 MED ORDER — TRANEXAMIC ACID-NACL 1000-0.7 MG/100ML-% IV SOLN
INTRAVENOUS | Status: AC
  Filled 2023-01-13: qty 100

## 2023-01-13 MED ORDER — ONDANSETRON HCL 4 MG/2ML IJ SOLN: 4.0000 mg | Freq: Once | INTRAMUSCULAR | Status: DC | PRN

## 2023-01-13 MED ORDER — LIDOCAINE HCL (PF) 2 % IJ SOLN
INTRAMUSCULAR | Status: AC
  Filled 2023-01-13: qty 5

## 2023-01-13 MED ORDER — EPINEPHRINE PF 1 MG/ML IJ SOLN
INTRAMUSCULAR | Status: AC
  Filled 2023-01-13: qty 6

## 2023-01-13 MED ORDER — DEXAMETHASONE SODIUM PHOSPHATE 4 MG/ML IJ SOLN
INTRAMUSCULAR | Status: DC | PRN
  Administered 2023-01-13: 8 mg via PERINEURAL

## 2023-01-13 MED ORDER — ONDANSETRON HCL 4 MG PO TABS: 4.0000 mg | ORAL_TABLET | Freq: Three times a day (TID) | ORAL | 0 refills | Status: AC | PRN

## 2023-01-13 MED ORDER — SODIUM CHLORIDE 0.9 % IR SOLN
Status: DC | PRN
  Administered 2023-01-13 (×11): 3000 mL

## 2023-01-13 MED ORDER — LIDOCAINE HCL (PF) 1 % IJ SOLN
INTRAMUSCULAR | Status: DC | PRN
  Administered 2023-01-13: 3 mL

## 2023-01-13 MED ORDER — ACETAMINOPHEN 500 MG PO TABS: 1000.0000 mg | ORAL_TABLET | Freq: Three times a day (TID) | ORAL | 0 refills | Status: AC

## 2023-01-13 MED ORDER — GLYCOPYRROLATE 0.2 MG/ML IJ SOLN
INTRAMUSCULAR | Status: DC | PRN
  Administered 2023-01-13: .1 mg via INTRAVENOUS

## 2023-01-13 MED ORDER — HYDROMORPHONE HCL 1 MG/ML IJ SOLN: 0.2500 mg | INTRAMUSCULAR | Status: DC | PRN

## 2023-01-13 MED ORDER — CLINDAMYCIN PHOSPHATE 900 MG/50ML IV SOLN
900.0000 mg | INTRAVENOUS | Status: AC
  Administered 2023-01-13: 900 mg via INTRAVENOUS
  Filled 2023-01-13: qty 50

## 2023-01-13 MED ORDER — ROPIVACAINE HCL 5 MG/ML IJ SOLN
INTRAMUSCULAR | Status: DC | PRN
  Administered 2023-01-13: 20 mL via PERINEURAL

## 2023-01-13 MED ORDER — TRANEXAMIC ACID-NACL 1000-0.7 MG/100ML-% IV SOLN
INTRAVENOUS | Status: DC | PRN
  Administered 2023-01-13: 1000 mg via INTRAVENOUS

## 2023-01-13 MED ORDER — DICLOFENAC SODIUM 75 MG PO TBEC: 75.0000 mg | DELAYED_RELEASE_TABLET | Freq: Two times a day (BID) | ORAL | 1 refills | Status: DC

## 2023-01-13 MED ORDER — PHENYLEPHRINE HCL-NACL 20-0.9 MG/250ML-% IV SOLN
INTRAVENOUS | Status: AC
  Filled 2023-01-13: qty 250

## 2023-01-13 MED ORDER — ORAL CARE MOUTH RINSE: 15.0000 mL | Freq: Once | OROMUCOSAL | Status: AC

## 2023-01-13 MED ORDER — MIDAZOLAM HCL 2 MG/2ML IJ SOLN
INTRAMUSCULAR | Status: AC
  Filled 2023-01-13: qty 2

## 2023-01-13 MED ORDER — BUPIVACAINE-EPINEPHRINE (PF) 0.5% -1:200000 IJ SOLN
INTRAMUSCULAR | Status: AC
  Filled 2023-01-13: qty 30

## 2023-01-13 MED ORDER — ONDANSETRON HCL 4 MG/2ML IJ SOLN
INTRAMUSCULAR | Status: DC | PRN
  Administered 2023-01-13: 4 mg via INTRAVENOUS

## 2023-01-13 MED ORDER — DEXMEDETOMIDINE HCL IN NACL 80 MCG/20ML IV SOLN
INTRAVENOUS | Status: DC | PRN
  Administered 2023-01-13 (×4): 4 ug via BUCCAL

## 2023-01-13 MED ORDER — DEXAMETHASONE SODIUM PHOSPHATE 10 MG/ML IJ SOLN
INTRAMUSCULAR | Status: DC | PRN
  Administered 2023-01-13: 10 mg via INTRAVENOUS

## 2023-01-13 MED ORDER — SUGAMMADEX SODIUM 200 MG/2ML IV SOLN
INTRAVENOUS | Status: DC | PRN
  Administered 2023-01-13: 225 mg via INTRAVENOUS

## 2023-01-13 MED ORDER — PHENYLEPHRINE HCL (PRESSORS) 10 MG/ML IV SOLN
INTRAVENOUS | Status: DC | PRN
  Administered 2023-01-13: 80 ug via INTRAVENOUS
  Administered 2023-01-13: 120 ug via INTRAVENOUS

## 2023-01-13 MED ORDER — SODIUM CHLORIDE 0.9 % IR SOLN
Status: DC | PRN
  Administered 2023-01-13: 1000 mL

## 2023-01-13 MED ORDER — ROCURONIUM BROMIDE 100 MG/10ML IV SOLN
INTRAVENOUS | Status: DC | PRN
  Administered 2023-01-13: 10 mg via INTRAVENOUS
  Administered 2023-01-13: 70 mg via INTRAVENOUS
  Administered 2023-01-13: 30 mg via INTRAVENOUS
  Administered 2023-01-13: 20 mg via INTRAVENOUS

## 2023-01-13 MED ORDER — FENTANYL CITRATE (PF) 100 MCG/2ML IJ SOLN
INTRAMUSCULAR | Status: DC | PRN
  Administered 2023-01-13 (×2): 50 ug via INTRAVENOUS

## 2023-01-13 SURGICAL SUPPLY — 73 items
ANCH SUT 2 SWLK 19.1 CLS EYLT (Anchor) ×6 IMPLANT
ANCH SUT 2.6 FBRSTK 1.7 (Anchor) ×4 IMPLANT
ANCH SUT 2.6 FBRTK 1.7 (Anchor) ×2 IMPLANT
ANCHOR FIBERTAK RC 2.6 (BLUE) (Anchor) IMPLANT
ANCHOR SUT FBRTK 2.6X1.7X2 (Anchor) IMPLANT
ANCHOR SWIVELOCK BIO 4.75X19.1 (Anchor) IMPLANT
APL PRP STRL LF DISP 70% ISPRP (MISCELLANEOUS) ×2
BLADE SURG 15 STRL LF DISP TIS (BLADE) IMPLANT
BLADE SURG 15 STRL SS (BLADE) ×2
BLADE SURG SZ10 CARB STEEL (BLADE) IMPLANT
BLADE SURG SZ11 CARB STEEL (BLADE) ×2 IMPLANT
BNDG GAUZE ELAST 4 BULKY (GAUZE/BANDAGES/DRESSINGS) ×4 IMPLANT
CANNULA 8.25X9 (CANNULA) IMPLANT
CANNULA TWIST IN 8.25X7CM (CANNULA) IMPLANT
CHLORAPREP W/TINT 26 (MISCELLANEOUS) ×2 IMPLANT
CLOTH BEACON ORANGE TIMEOUT ST (SAFETY) ×2 IMPLANT
COOLER ICEMAN CLASSIC (MISCELLANEOUS) ×2 IMPLANT
COVER LIGHT HANDLE STERIS (MISCELLANEOUS) ×4 IMPLANT
CUTTER BONE 4.0MM X 13CM (MISCELLANEOUS) IMPLANT
DRAPE INCISE IOBAN 44X35 STRL (DRAPES) ×2 IMPLANT
DRAPE SHOULDER BEACH CHAIR (DRAPES) ×2 IMPLANT
DRAPE U-SHAPE 47X51 STRL (DRAPES) ×2 IMPLANT
ELECT REM PT RETURN 9FT ADLT (ELECTROSURGICAL) ×2
ELECTRODE REM PT RTRN 9FT ADLT (ELECTROSURGICAL) ×2 IMPLANT
GAUZE SPONGE 4X4 12PLY STRL (GAUZE/BANDAGES/DRESSINGS) IMPLANT
GAUZE XEROFORM 1X8 LF (GAUZE/BANDAGES/DRESSINGS) ×2 IMPLANT
GLOVE BIO SURGEON STRL SZ7 (GLOVE) IMPLANT
GLOVE BIO SURGEON STRL SZ8 (GLOVE) ×8 IMPLANT
GLOVE BIOGEL PI IND STRL 7.0 (GLOVE) ×4 IMPLANT
GLOVE BIOGEL PI IND STRL 8.5 (GLOVE) IMPLANT
GLOVE ECLIPSE 6.5 STRL STRAW (GLOVE) IMPLANT
GLOVE SKINSENSE STRL SZ8.0 LF (GLOVE) IMPLANT
GLOVE SRG 8 PF TXTR STRL LF DI (GLOVE) ×2 IMPLANT
GLOVE SURG UNDER POLY LF SZ8 (GLOVE) ×2
GOWN STRL REUS W/ TWL XL LVL3 (GOWN DISPOSABLE) ×2 IMPLANT
GOWN STRL REUS W/TWL LRG LVL3 (GOWN DISPOSABLE) ×4 IMPLANT
GOWN STRL REUS W/TWL XL LVL3 (GOWN DISPOSABLE) ×2
INST SET MINOR BONE (KITS) ×2 IMPLANT
IV NS IRRIG 3000ML ARTHROMATIC (IV SOLUTION) ×4 IMPLANT
KIT ANCHOR FBRTK 2.6 STR (KITS) IMPLANT
KIT POSITION SHOULDER SCHLEI (MISCELLANEOUS) ×2 IMPLANT
KIT STABILIZATION SHOULDER (MISCELLANEOUS) ×2 IMPLANT
KIT TURNOVER KIT A (KITS) ×2 IMPLANT
MANIFOLD NEPTUNE II (INSTRUMENTS) ×2 IMPLANT
NDL SCORPION MULTI FIRE (NEEDLE) IMPLANT
NDL SPNL 18GX3.5 QUINCKE PK (NEEDLE) ×2 IMPLANT
NEEDLE SCORPION MULTI FIRE (NEEDLE) ×4 IMPLANT
NEEDLE SPNL 18GX3.5 QUINCKE PK (NEEDLE) ×2 IMPLANT
NS IRRIG 1000ML POUR BTL (IV SOLUTION) ×2 IMPLANT
PACK TOTAL JOINT (CUSTOM PROCEDURE TRAY) IMPLANT
PAD ABD 5X9 TENDERSORB (GAUZE/BANDAGES/DRESSINGS) ×6 IMPLANT
PAD ARMBOARD 7.5X6 YLW CONV (MISCELLANEOUS) ×2 IMPLANT
PAD COLD SHLDR WRAP-ON (PAD) ×2 IMPLANT
PORT APPOLLO RF 90DEGREE MULTI (SURGICAL WAND) IMPLANT
SET ARTHROSCOPY INST (INSTRUMENTS) ×2 IMPLANT
SET BASIN LINEN APH (SET/KITS/TRAYS/PACK) ×2 IMPLANT
SET SHOULDER TRAC (MISCELLANEOUS) ×2 IMPLANT
SET SHOULDER TRACTION (MISCELLANEOUS) ×2
SLING ARM ULTRA III XL (SLING) IMPLANT
STRIP CLOSURE SKIN 1/2X4 (GAUZE/BANDAGES/DRESSINGS) IMPLANT
SUT ETHILON 3 0 FSL (SUTURE) IMPLANT
SUT FIBERWIRE #2 38 REV NDL BL (SUTURE) ×2
SUT FIBERWIRE #2 38 T-5 BLUE (SUTURE) ×4
SUT MNCRL AB 4-0 PS2 18 (SUTURE) IMPLANT
SUT MON AB 2-0 CT1 36 (SUTURE) ×2 IMPLANT
SUTURE FIBERWR #2 38 T-5 BLUE (SUTURE) IMPLANT
SUTURE FIBERWR#2 38 REV NDL BL (SUTURE) IMPLANT
SUTURE TAPE TIGERLINK 1.3MM BL (SUTURE) IMPLANT
SUTURETAPE 1.3 40 W/NDL BLUE (SUTURE) IMPLANT
SUTURETAPE TIGERLINK 1.3MM BL (SUTURE) ×2
SYR 10ML LL (SYRINGE) IMPLANT
TUBING IN/OUT FLOW W/MAIN PUMP (TUBING) ×2 IMPLANT
YANKAUER SUCT 12FT TUBE ARGYLE (SUCTIONS) ×2 IMPLANT

## 2023-01-13 NOTE — Anesthesia Procedure Notes (Signed)
Procedure Name: Intubation Date/Time: 01/13/2023 7:47 AM  Performed by: Jeral Pinch, RNPre-anesthesia Checklist: Patient identified, Emergency Drugs available, Suction available and Patient being monitored Patient Re-evaluated:Patient Re-evaluated prior to induction Oxygen Delivery Method: Circle system utilized Preoxygenation: Pre-oxygenation with 100% oxygen Induction Type: IV induction Ventilation: Mask ventilation with difficulty, Two handed mask ventilation required and Oral airway inserted - appropriate to patient size Laryngoscope Size: Mac and 3 Grade View: Grade I Tube type: Oral Tube size: 7.0 mm Number of attempts: 1 Airway Equipment and Method: Stylet and Oral airway Placement Confirmation: ETT inserted through vocal cords under direct vision, positive ETCO2 and breath sounds checked- equal and bilateral Secured at: 22 cm Tube secured with: Tape Dental Injury: Teeth and Oropharynx as per pre-operative assessment

## 2023-01-13 NOTE — Anesthesia Procedure Notes (Signed)
Anesthesia Regional Block: Interscalene brachial plexus block   Pre-Anesthetic Checklist: , timeout performed,  Correct Patient, Correct Site, Correct Laterality,  Correct Procedure, Correct Position, site marked,  Risks and benefits discussed,  Surgical consent,  Pre-op evaluation,  At surgeon's request and post-op pain management  Laterality: Upper and Left  Prep: chloraprep       Needles:  Injection technique: Single-shot  Needle Type: Echogenic Stimulator Needle     Needle Length: 8.3cm  Needle Gauge: 22   Needle insertion depth: 5 cm   Additional Needles:   Procedures:, nerve stimulator,,, ultrasound used (permanent image in chart),,     Nerve Stimulator or Paresthesia:  Response: no Twitches elicited, 0.5 mA, 0.3 ms, 5 cm  Additional Responses:   Narrative:  Start time: 01/13/2023 7:16 AM End time: 01/13/2023 7:23 AM Injection made incrementally with aspirations every 5 mL.  Performed by: Personally  Anesthesiologist: Denese Killings, MD  Additional Notes: Block assessed prior to start of surgery

## 2023-01-13 NOTE — Anesthesia Postprocedure Evaluation (Signed)
Anesthesia Post Note  Patient: Brian Newton  Procedure(s) Performed: ARTHROSCOPIC ROTATOR CUFF REPAIR (Left: Shoulder) SHOULDER ARTHROSCOPY WITH BICEPS TENOTOMY (Shoulder) OPEN SUBSCAPULARIS REPAIR (Shoulder)  Patient location during evaluation: Phase II Anesthesia Type: General Level of consciousness: awake and alert and oriented Pain management: pain level controlled Vital Signs Assessment: post-procedure vital signs reviewed and stable Respiratory status: spontaneous breathing, nonlabored ventilation and respiratory function stable Cardiovascular status: blood pressure returned to baseline and stable Postop Assessment: no apparent nausea or vomiting Anesthetic complications: no  No notable events documented.   Last Vitals:  Vitals:   01/13/23 1245 01/13/23 1258  BP: 115/68 134/86  Pulse: 93 90  Resp: 18 16  Temp:  36.9 C  SpO2: 90% 95%    Last Pain:  Vitals:   01/13/23 1258  TempSrc: Oral  PainSc: 0-No pain                 Acquanetta Cabanilla C Amaurie Schreckengost

## 2023-01-13 NOTE — Transfer of Care (Signed)
Immediate Anesthesia Transfer of Care Note  Patient: Brian Newton  Procedure(s) Performed: ARTHROSCOPIC ROTATOR CUFF REPAIR (Left: Shoulder) SHOULDER ARTHROSCOPY WITH BICEPS TENOTOMY (Shoulder) OPEN SUBSCAPULARIS REPAIR (Shoulder)  Patient Location: PACU  Anesthesia Type:GA combined with regional for post-op pain  Level of Consciousness: drowsy  Airway & Oxygen Therapy: Patient Spontanous Breathing and Patient connected to face mask oxygen  Post-op Assessment: Report given to RN and Post -op Vital signs reviewed and stable  Post vital signs: Reviewed and stable  Last Vitals:  Vitals Value Taken Time  BP 111/78 01/13/23 1200  Temp 37 C 01/13/23 1156  Pulse 83 01/13/23 1200  Resp 23 01/13/23 1200  SpO2 95 % 01/13/23 1200  Vitals shown include unvalidated device data.  Last Pain:  Vitals:   01/13/23 0650  TempSrc: Oral  PainSc: 0-No pain      Patients Stated Pain Goal: 6 (09/64/38 3818)  Complications: No notable events documented.

## 2023-01-13 NOTE — Anesthesia Preprocedure Evaluation (Signed)
Anesthesia Evaluation  Patient identified by MRN, date of birth, ID band Patient awake    Reviewed: Allergy & Precautions, H&P , NPO status , Patient's Chart, lab work & pertinent test results  Airway Mallampati: III  TM Distance: >3 FB Neck ROM: Full    Dental  (+) Missing   Pulmonary sleep apnea and Continuous Positive Airway Pressure Ventilation    Pulmonary exam normal breath sounds clear to auscultation       Cardiovascular hypertension, Pt. on medications Normal cardiovascular exam Rhythm:Regular Rate:Normal     Neuro/Psych  Headaches  negative psych ROS   GI/Hepatic negative GI ROS, Neg liver ROS,,,  Endo/Other    Morbid obesity  Renal/GU negative Renal ROS  negative genitourinary   Musculoskeletal  (+) Arthritis , Osteoarthritis,    Abdominal   Peds negative pediatric ROS (+)  Hematology negative hematology ROS (+)   Anesthesia Other Findings   Reproductive/Obstetrics negative OB ROS                             Anesthesia Physical Anesthesia Plan  ASA: 3  Anesthesia Plan: General   Post-op Pain Management: Regional block* and Dilaudid IV   Induction: Intravenous  PONV Risk Score and Plan: 3 and Ondansetron, Dexamethasone and Midazolam  Airway Management Planned: Oral ETT  Additional Equipment:   Intra-op Plan:   Post-operative Plan: Extubation in OR  Informed Consent: I have reviewed the patients History and Physical, chart, labs and discussed the procedure including the risks, benefits and alternatives for the proposed anesthesia with the patient or authorized representative who has indicated his/her understanding and acceptance.     Dental advisory given  Plan Discussed with: CRNA and Surgeon  Anesthesia Plan Comments:        Anesthesia Quick Evaluation

## 2023-01-13 NOTE — Discharge Instructions (Signed)
Abbagayle Zaragoza A. Amedeo Kinsman, MD [facility] [address] [place],  [place]  [address] Phone: [phone] Fax: [phone]    POST-OPERATIVE INSTRUCTIONS - SHOULDER ARTHROSCOPY  WOUND CARE You may remove the Operative Dressing on Post-Op Day #3 (72hrs after surgery).   Alternatively if you would like you can leave dressing on until follow-up if within 7-8 days but keep it dry. Leave steri-strips in place until they fall off on their own, usually 2 weeks postop. There may be a small amount of fluid/bleeding leaking at the surgical site. This is normal; the shoulder is filled with fluid during the procedure and can leak for 24-48hrs after surgery. ou may change/reinforce the bandage as needed.  Use the Cryocuff or Ice as often as possible for the first 7 days, then as needed for pain relief. Always keep a towel, ACE wrap or other barrier between the cooling unit and your skin.  You may shower on Post-Op Day #3. Gently pat the area dry. Do not soak the shoulder in water or submerge it. Keep dry incisions as dry as possible. Do not go swimming in the pool or ocean until 4 weeks after surgery or when otherwise instructed.    EXERCISES/BRACING Sling should be used at all times until follow-up.  You can remove sling for hygiene.    Please continue to ambulate and do not stay sitting or lying for too long. Perform foot and wrist pumps to assist in circulation.  POST-OP MEDICATIONS- Multimodal approach to pain control In general your pain will be controlled with a combination of substances.  Prescriptions unless otherwise discussed are electronically sent to your pharmacy.  This is a carefully made plan we use to minimize narcotic use.    Diclofenac - Anti-inflammatory medication taken on a scheduled basis.  **Continue current prescription, twice daily for the first 2 weeks.  Acetaminophen - Non-narcotic pain medicine taken on a scheduled basis  Oxycodone - This is a strong  narcotic, to be used only on an "as needed" basis for pain. Aspirin '81mg'$  - This medicine is used to minimize the risk of blood clots after surgery.  Can stop taking this medicine if activity level returns to normal Zofran - take as needed for nausea   FOLLOW-UP If you develop a Fever (?101.5), Redness or Drainage from the surgical incision site, please call our office to arrange for an evaluation. Please call the office to schedule a follow-up appointment for your suture removal, 10-14 days post-operatively.    HELPFUL INFORMATION  If you had a block, it will wear off between 8-24 hrs postop typically.  This is period when your pain may go from nearly zero to the pain you would have had postop without the block.  This is an abrupt transition but nothing dangerous is happening.  You may take an extra dose of narcotic when this happens.  You may be more comfortable sleeping in a semi-seated position the first few nights following surgery.  Keep a pillow propped under the elbow and forearm for comfort.  If you have a recliner type of chair it might be beneficial.  If not that is fine too, but it would be helpful to sleep propped up with pillows behind your operated shoulder as well under your elbow and forearm.  This will reduce pulling on the suture lines.  When dressing, put your operative arm in the sleeve first.  When getting undressed, take your operative arm out last.  Loose fitting,  button-down shirts are recommended.  Often in the first days after surgery you may be more comfortable keeping your operative arm under your shirt and not through the sleeve.  You may return to work/school in the next couple of days when you feel up to it.  Desk work and typing in the sling is     fine.  We suggest you use the pain medication the first night prior to going to bed, in order to ease any pain when the anesthesia wears off. You should avoid taking pain medications on an empty stomach as it will make  you nauseous.  You should wean off your narcotic medicines as soon as you are able.  Most patients will be off or using minimal narcotics before their first postop appointment.   Do not drink alcoholic beverages or take illicit drugs when taking pain medications.  It is against the law to drive while taking narcotics.  In some states it is against the law to drive while your arm is in a sling.   Pain medication may make you constipated.  Below are a few solutions to try in this order: Decrease the amount of pain medication if you aren't having pain. Drink lots of decaffeinated fluids. Drink prune juice and/or eat dried prunes  If the first 3 don't work start with additional solutions Take Colace - an over-the-counter stool softener Take Senokot - an over-the-counter laxative Take Miralax - a stronger over-the-counter laxative

## 2023-01-13 NOTE — Interval H&P Note (Signed)
History and Physical Interval Note:  01/13/2023 7:10 AM  Brian Newton  has presented today for surgery, with the diagnosis of Traumatic complete tear of rotator cuff, left.  The various methods of treatment have been discussed with the patient and family. After consideration of risks, benefits and other options for treatment, the patient has consented to  Procedure(s): ARTHROSCOPIC ROTATOR CUFF REPAIR (Left) as a surgical intervention.  The patient's history has been reviewed, patient examined, no change in status, stable for surgery.  I have reviewed the patient's chart and labs.  Questions were answered to the patient's satisfaction.     [name]

## 2023-01-13 NOTE — Op Note (Addendum)
Orthopaedic Surgery Operative Note (CSN: [id number])  Brian Newton  1966-05-28 Date of Surgery: 01/13/2023   Diagnoses:  Traumatic complete tear of rotator cuff, left  Procedure: Arthroscopic subacromial decompression Arthroscopic rotator cuff repair Biceps tenotomy Open subscapularis repair   Operative Finding Exam under anesthesia: 60 degrees forward flexion, 120 degrees abduction, 50 degrees of external rotation of the side.  Internal rotation to lumbar spine. Articular space: No loose bodies, capsule intact, anterior labral fraying Chondral surfaces:Intact, mild degenerative wear on both the humeral head, as well as the glenoid.  No full-thickness cartilage loss. Biceps: Biceps was intact at its insertion, however distally, it was partially torn with long to normal splits.  Subscapularis: Full-thickness tear with retraction Superior Cuff: Full-thickness tearing to the level of the glenoid.  Small amount of tendon remained at the footprint  Successful completion of the planned procedure.  Due to the extensive nature of the subscapularis tear, as well as the retraction, I did not feel comfortable proceeding with an arthroscopic repair.  Made the decision to complete an open repair, which was completed successfully.  Superior cuff was repaired with 3 medial row anchors, followed by 2 lateral anchors.   Post-Op Diagnosis: Same Surgeons:Primary: [name], MD Assisting: Carole Civil, MD Assistants:  Arther Abbott, MD whose assistance was necessary for retraction, holding the camera, and assistance with passing sutures. 2.   Franklyn Lor Location: [facility] OR ROOM 4 Anesthesia: General with interscalene block Antibiotics: Ancef 2 g Tourniquet time: None Estimated Blood Loss: 50 cc Complications: None Specimens: None Implants: Implant Name Type Inv. Item Serial No. Manufacturer Lot No. LRB No. Used Action  ANCHOR SUT FBRTK 2.6X1.7X2 - [id number] Anchor ANCHOR SUT FBRTK  2.6X1.7X2  [employer] [id number] Left 1 Implanted  ANCHOR SUT FBRTK 2.6X1.7X2 - [id number] Anchor ANCHOR SUT FBRTK 2.6X1.7X2  [employer] [id number] Left 1 Implanted  ANCHOR FIBERTAK RC 2.6 [facility]) - [id number] Anchor ANCHOR FIBERTAK RC 2.6 [facility])  [employer] [id number] Left 1 Implanted  [facility] BIO 4.75X19.1 - [id number] Anchor ANCHOR SWIVELOCK BIO 4.75X19.1  [employer] [id number] Left 1 Implanted  [employer] BIO 4.75X19.1 - [id number] Anchor ANCHOR SWIVELOCK BIO 4.75X19.1  [employer] [id number] Left 1 Implanted  [employer] BIO 1.19E17.4 - [id number] Anchor ANCHOR SWIVELOCK BIO 4.75X19.1  Rolinda Roan [id number] Left 1 Implanted    Indications for Surgery:   Brian Newton is a 57 y.o. male who had an injury, little over a month ago, and sustained a large, full-thickness rotator cuff tear involving the subscapularis, supraspinatus and infraspinatus.  Upon further discussion, and review of prior medical records, it was determined that his current injury is in fact acute.  Therefore, we plan to proceed with an arthroscopic rotator cuff repair.  He was aware that there is a possibility we would have to open in order to appropriately fix the tears.  The risks and benefits were explained at length including but not limited to continued pain, cuff failure, stiffness, need for further surgery, bleeding, infection and more severe complications associated with anesthesia.  He elected proceed.  Surgical consent was finalized.   Procedure:   Patient was correctly identified in the preoperative holding area and operative site marked.  Patient brought to OR and positioned beachchair on an [place] table ensuring that all bony prominences were padded and the head was in an appropriate location.  Anesthesia was induced and the operative shoulder was prepped and draped in the usual sterile fashion.  Timeout was called preincision.  We started by making a standard posterior viewing portal.  The camera was  advanced.  We were within the joint.  This was evaluated, and the findings are noted above.  There was no superior cuff impeding her view.  Evaluation of the glenoid demonstrated some mild degenerative changes.  The anterior labrum was frayed.  The biceps tendon was attached at the root, but the distal portion of the intra-articular tendon was frayed and partially torn.  We then made the decision to proceed with that tenotomy.  The remaining stump was debrided.  We then evaluated the anterior rotator cuff.  It was difficult to visualize the subscapularis tendon.  We completed some light debridement, noted some superior fibers, which were not consistent with the orientation of the subscapularis tendon.  The tendon was retracted to the level of the glenoid, and quality tissue was not easily visualized.  At this point, I made the decision to proceed with an open subscapularis repair.  As such, we proceeded to address the superior cuff.  The tendinous tissue was noted to be at the level of the glenoid.  There was hyperemic tissue, consistent with an acute injury.  The footprint on the humeral head was completely bare, safe for a small amount of residual rotator cuff tendon.  This was debrided gently with a shaver, down to bleeding bone.  We then placed a traction suture and the existing rotator cuff tendon.  With just a little bit of tension, we were able to cover the humeral head.  We did debride on top of the tendon to provide some additional excursion.  We also debrided the undersurface of the acromion.  There were no bone spurs that needed to be addressed.  Once were satisfied with the excursion of the cuff, we then plan to proceed with a double row repair.  Once again we evaluated the footprint for the rotator cuff.  All soft tissue had been debrided.  There was light bleeding in the existing bone.  We then placed 3 medial row anchors percutaneously through stab incisions.  These were evenly spaced at the  articular margin.  We did plan to medialize the footprint just a little bit.  The anchors were then passed through the tendinous tissue, and then parked through the existing percutaneous portal until all sutures were passed.  The most posterior anchor we were able to use the knotless mechanism to create some further compression of the medial row.  However cinching down the knotless component of the anchor, we did pull tension on the tendon in order to ensure that it was covering the humeral head.  This was completed without issues.  We did have compression at the most posterior anchor.  The sponge was then cut, and we were able to place 2 swivel lock anchors laterally.  1 limb from each of the medial rows was passed into the posterior anchor.  We then proceeded with the anterior swivel lock anchor, and used the knotless component to address a dogear and provide further compression.  At this point, we are satisfied with the appearance of the rotator cuff tendons.  They effectively cover the humeral head.  They were back to the footprint, and we had created compression across the footprint of the rotator cuff tendons.  We then proceeded with light debridement of the bursal tissue on top of the rotator cuff, and the instruments were subsequently removed.  We then plan to proceed with an open subscapularis repair.  Open instruments were  available.  He received 1 g of TXA prior to making incision for the open subscapularis repair.  We made an oblique incision in line with the deltopectoral interval.  The incision was started distal to the coracoid, focusing on the subscapularis insertion site.  We debrided gently through subcutaneous tissue.  The vein was identified, protected and retracted laterally.  We then made a blunt incision through the deltopectoral fascia, and identified the conjoined tendon.  We then made an incision through the clavipectoral fascia.  A few millimeters of the upper border of the pectoralis  tendon was released.  We then placed a cobra retractor, and we were able to visualize the anterior aspect of the shoulder.  The most superior aspect of the subscapularis tendon was clearly retracted, without robust tissue.  This was gently debrided.  We further released the subscapularis tendon, placed 2 horizontal mattress sutures with FiberWire.  This provided full control of the tendon.  It was released superficial, as well as deep to the tendon.  We were then able to reduce the tendon to its footprint on the lesser tuberosity.  We gently debrided the lesser tuberosity with a rongeur.  There was slight bleeding at the insertion site.  We then placed a swivel lock anchor, with a knotless suture in place within the footprint.  This provided excellent compression and reduction of the subscapularis tendon.  We then used a scorpion to pass the knotless mechanism, to provide additional compression.  Sutures were truncated.  The shoulder was gently internally and externally rotated.  At this point, we are satisfied with the positioning of the subscapularis tendon, and noted that it was moving as a single unit.  The incision was irrigated.  The open incision was closed with absorbable monocryl and steri strips.  Arthroscopic portals were closed with 3-0 nylon.  A sterile dressing was placed along with a sling. The patient was awoken from general anesthesia and taken to the PACU in stable condition without complication.       Post-operative plan:  The patient will be non-weightbearing in a sling.  Maintain the sling at all times. The patient will be discharged home.   DVT prophylaxis with 81 mg of aspirin twice daily, until the patient is fully ambulatory. Pain control with PRN pain medication preferring oral medicines.  . Follow up plan will be scheduled in approximately 10 days for incision check and XR.

## 2023-01-13 NOTE — OR Nursing (Signed)
Updated wife on Patient procedure per Dr. Amedeo Kinsman request.

## 2023-01-17 ENCOUNTER — Telehealth: Payer: Self-pay | Admitting: Orthopedic Surgery

## 2023-01-17 NOTE — Telephone Encounter (Signed)
Patient lvm stating he had shoulder surgery Thursday and he's wanting to know when he takes the surgical dressing off, what is he supposed to put over the would at that time.  Pt's # [phone]

## 2023-01-19 ENCOUNTER — Encounter (HOSPITAL_COMMUNITY): Payer: Self-pay | Admitting: Orthopedic Surgery

## 2023-01-19 NOTE — Telephone Encounter (Signed)
Spoke wit pt who states big bandage has come off. Told pt to keep all areas as dry as possible and we will most likely look at and remove stitches at his appointment. Pt is concerned about some rubbing over a suture, let pt know that he can put a bandaid over the suture to prevent irritation or pulling. Verbalized understand, no further questions or concerns at this time.

## 2023-01-25 ENCOUNTER — Encounter: Payer: Self-pay | Admitting: Orthopedic Surgery

## 2023-01-25 ENCOUNTER — Ambulatory Visit (INDEPENDENT_AMBULATORY_CARE_PROVIDER_SITE_OTHER): Payer: Federal, State, Local not specified - PPO

## 2023-01-25 ENCOUNTER — Ambulatory Visit (INDEPENDENT_AMBULATORY_CARE_PROVIDER_SITE_OTHER): Payer: Federal, State, Local not specified - PPO | Admitting: Orthopedic Surgery

## 2023-01-25 VITALS — Ht 65.0 in | Wt 250.0 lb

## 2023-01-25 DIAGNOSIS — S46012D Strain of muscle(s) and tendon(s) of the rotator cuff of left shoulder, subsequent encounter: Secondary | ICD-10-CM | POA: Diagnosis not present

## 2023-01-25 NOTE — Progress Notes (Signed)
Orthopaedic Postop Note  Assessment: Brian Newton is a 57 y.o. male s/p left arthroscopic rotator cuff repair of massive superior cuff tear, with open repair of subscapularis  DOS: 01/13/2023  Plan: Mr. Crescenzo is doing well. Sutures removed, Steri-Strips placed Arthroscopic images were reviewed. Okay to shower Stay in the sling and pillow at all times with the exception of hygiene and exercises Demonstrated pendulum activities, which can begin. He will return to clinic in 2 weeks.  We will plan to proceed with physical therapy after that.   Follow-up: Return in about 2 weeks (around 02/08/2023). XR at next visit: None  Subjective:  Chief Complaint  Patient presents with   Routine Post Op     L shoulder DOS 01/13/23    History of Present Illness: Brian Newton is a 57 y.o. male who presents following the above stated procedure.  Surgery was approximately 2 weeks ago.  He has done well.  He has not taken narcotic pain medication for 2 weeks.  He has remained in the sling at all times.  He has undone the strap to sleep, but states he has remained on his back.  No numbness or tingling.  No fevers or chills.  He continues to take the aspirin.  Review of Systems: No fevers or chills No numbness or tingling No Chest Pain No shortness of breath   Objective: Ht '5\' 5"'$  (1.651 m)   Wt 250 lb (113.4 kg)   BMI 41.60 kg/m   Physical Exam:  Surgical incisions are healing.  There is a small amount of irritation, but no drainage or pain.  He has intact sensation in the axillary nerve distribution.  Fingers are warm and well-perfused.  Sensation intact throughout the left hand.  2+ radial pulse.  IMAGING: I personally ordered and reviewed the following images:  X-rays left shoulder were obtained in clinic today.  No acute injuries are noted.  Drill holes from arthroscopic procedure are visible.  No evidence of proximal humeral migration.  No fracture or dislocation.  No bony  lesions.  Impression: Left shoulder x-ray with sequelae of arthroscopic repair   [name], MD 01/25/2023 11:53 AM

## 2023-02-08 ENCOUNTER — Ambulatory Visit (INDEPENDENT_AMBULATORY_CARE_PROVIDER_SITE_OTHER): Payer: Federal, State, Local not specified - PPO | Admitting: Orthopedic Surgery

## 2023-02-08 ENCOUNTER — Encounter: Payer: Self-pay | Admitting: Orthopedic Surgery

## 2023-02-08 DIAGNOSIS — S46012D Strain of muscle(s) and tendon(s) of the rotator cuff of left shoulder, subsequent encounter: Secondary | ICD-10-CM

## 2023-02-09 NOTE — Progress Notes (Signed)
Orthopaedic Postop Note  Assessment: Brian Newton is a 57 y.o. male s/p left arthroscopic rotator cuff repair of massive superior cuff tear, with open repair of subscapularis  DOS: 01/13/2023  Plan: Brian Newton is doing well.  His pain is controlled.  Incisions healing well.  He has remained in his sling.  Continue with medications as needed.  Physical therapy is set to begin next week.  Protocol provided.  I will see him back in 1 month for repeat evaluation.  All questions were answered.  Follow-up: Return in about 4 weeks (around 03/08/2023). XR at next visit: None  Subjective:  Chief Complaint  Patient presents with   Post-op Follow-up    Left rotator cuff repair 01/13/23  improving     History of Present Illness: Brian Newton is a 57 y.o. male who presents following the above stated procedure.  Surgery was approximately 4 weeks ago.  No issues at this point.  He is still taking aspirin.  He is not taking pain medication.  Occasional over-the-counter pain medication.  He has remained in a sling, with the exception of hygiene.  He and is distractible and using his sleep position.  No issues at this time.   Review of Systems: No fevers or chills No numbness or tingling No Chest Pain No shortness of breath   Objective: There were no vitals taken for this visit.  Physical Exam:  Surgical incisions are healing.  There is a small amount of irritation, but no drainage or pain.  He has intact sensation in the axillary nerve distribution.  Fingers are warm and well-perfused.  Sensation intact throughout the left hand.  2+ radial pulse.  IMAGING: I personally ordered and reviewed the following images:  No new imaging obtained today.   Brian Rasmussen, MD 02/09/2023 8:21 AM

## 2023-02-15 ENCOUNTER — Ambulatory Visit (HOSPITAL_COMMUNITY): Payer: Federal, State, Local not specified - PPO | Admitting: Occupational Therapy

## 2023-02-17 ENCOUNTER — Other Ambulatory Visit: Payer: Self-pay

## 2023-02-17 ENCOUNTER — Encounter (HOSPITAL_COMMUNITY): Payer: Self-pay | Admitting: Occupational Therapy

## 2023-02-17 ENCOUNTER — Encounter: Payer: Self-pay | Admitting: Radiology

## 2023-02-17 ENCOUNTER — Ambulatory Visit (HOSPITAL_COMMUNITY): Payer: Federal, State, Local not specified - PPO | Attending: Orthopedic Surgery | Admitting: Occupational Therapy

## 2023-02-17 DIAGNOSIS — M25512 Pain in left shoulder: Secondary | ICD-10-CM | POA: Insufficient documentation

## 2023-02-17 DIAGNOSIS — M25612 Stiffness of left shoulder, not elsewhere classified: Secondary | ICD-10-CM | POA: Diagnosis not present

## 2023-02-17 DIAGNOSIS — R29898 Other symptoms and signs involving the musculoskeletal system: Secondary | ICD-10-CM | POA: Insufficient documentation

## 2023-02-17 DIAGNOSIS — S46012D Strain of muscle(s) and tendon(s) of the rotator cuff of left shoulder, subsequent encounter: Secondary | ICD-10-CM | POA: Diagnosis not present

## 2023-02-17 NOTE — Patient Instructions (Signed)
1) SHOULDER: Flexion On Table   Place hands on towel placed on table, elbows straight. Lean forward with you upper body, pushing towel away from body.  10 reps per set, 3-5 sets per day  2) Abduction (Passive)   With arm out to side, resting on towel placed on table with palm DOWN, keeping trunk away from table, lean to the side while pushing towel away from body.  Repeat 10 times. Do 3-5 sessions per day.  Copyright  [employer]. All rights reserved.     3) Internal Rotation (Assistive)   Seated with elbow bent at right angle and held against side, slide arm on table surface in an inward arc keeping elbow anchored in place. Repeat 10 times. Do 3-5 sessions per day. Activity: Use this motion to brush crumbs off the table.  Copyright  V[employer]. All rights reserved.

## 2023-02-17 NOTE — Therapy (Signed)
OUTPATIENT OCCUPATIONAL THERAPY ORTHO EVALUATION  Patient Name: Brian Newton MRN: [id number] DOB:12/03/1966, 57 y.o., male Today's Date: 02/17/2023  PCP: Dr. Jenna Luo  REFERRING PROVIDER: Dr. Larena Glassman  END OF SESSION:  OT End of Session - 02/17/23 1524     Visit Number 1    Number of Visits 16    Date for OT Re-Evaluation 04/18/23   mini-reassessment 03/17/23   Authorization Type [employer]    Authorization Time Period 23 visit limit    Authorization - Visit Number 1    Authorization - Number of Visits 58    OT Start Time 1302    OT Stop Time 1335    OT Time Calculation (min) 33 min    Activity Tolerance Patient tolerated treatment well    Behavior During Therapy WFL for tasks assessed/performed             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)    Wrist pain    Past Surgical History:  Procedure Laterality Date   APPENDECTOMY  1986   ARTHOSCOPIC ROTAOR CUFF REPAIR Left 01/13/2023   Procedure: ARTHROSCOPIC ROTATOR CUFF REPAIR;  Surgeon: [name], MD;  Location: [facility];  Service: Orthopedics;  Laterality: Left;   BREATH TEK H PYLORI N/A 08/08/2014   Procedure: BREATH TEK H PYLORI;  Surgeon: Edward Jolly, MD;  Location: [facility];  Service: General;  Laterality: N/A;   COLONOSCOPY N/A 03/02/2013   Procedure: COLONOSCOPY;  Surgeon: Beryle Beams, MD;  Location: [facility];  Service: Endoscopy;  Laterality: N/A;   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   Procedure: LAPAROSCOPIC ROUX-EN-Y GASTRIC BYPASS WITH UPPER ENDOSCOPY;  Surgeon: Excell Seltzer, MD;  Location: [facility];  Service: General;  Laterality: N/A;   KNEE ARTHROSCOPY  2001   rt knee   OPEN SUBSCAPULARIS REPAIR  01/13/2023   Procedure: OPEN  SUBSCAPULARIS REPAIR;  Surgeon: [name], MD;  Location: [facility];  Service: Orthopedics;;   SHOULDER ARTHROSCOPY WITH BICEPSTENOTOMY  01/13/2023   Procedure: SHOULDER ARTHROSCOPY WITH BICEPS TENOTOMY;  Surgeon: [name], MD;  Location: [facility];  Service: Orthopedics;;   UPPER GI ENDOSCOPY  11/05/2014   Procedure: UPPER GI ENDOSCOPY;  Surgeon: Excell Seltzer, MD;  Location: [facility];  Service: General;;   Patient Active Problem List   Diagnosis Date Noted   History of colonic polyps 11/27/2021   Morbid obesity ([place]) 04/01/2014   Hypercholesteremia    Hypertension    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  Pt accompanied by: self  PERTINENT HISTORY: Pt is a 57 y/o male s/p s/p left arthroscopic rotator cuff repair of massive superior cuff tear, with open repair of subscapularis on 01/13/23. Pt noted to have a  massive tear and is on a conservative protocol.   PRECAUTIONS: Shoulder, see protocol for massive tear  WEIGHT BEARING RESTRICTIONS: Yes NWB  PAIN:  Are you having pain? No  FALLS: Has patient fallen in last 6 months? Yes. Number of falls 1  PLOF: Independent  PATIENT GOALS: To be able to use the LUE.   NEXT MD VISIT: 03/08/23  OBJECTIVE:   HAND DOMINANCE: Right  ADLs: Overall ADLs: Pt is having difficulty with sleeping,   FUNCTIONAL OUTCOME MEASURES: FOTO: 40/100  UPPER EXTREMITY ROM:     Assessed supine, er/IR adducted  Passive ROM Left eval  Shoulder flexion  128  Shoulder abduction 82  Shoulder internal rotation 90  Shoulder external rotation 52  (Blank rows = not tested)  Active ROM Left eval  Shoulder flexion   Shoulder abduction   Shoulder internal rotation   Shoulder external rotation   (Blank rows = not tested)   UPPER EXTREMITY MMT:         MMT Left eval  Shoulder flexion   Shoulder abduction   Shoulder internal rotation   Shoulder external rotation   (Blank rows = not tested)   SENSATION: WFL  EDEMA: Edema observed at left upper arm and anterior shoulder  COGNITION: Overall cognitive status: Within functional limits for tasks assessed  OBSERVATIONS: Mod fascial restrictions noted in left upper arm, anterior shoulder, trapezius, and scapular regions   TODAY'S TREATMENT:                                                                                                                              DATE: N/A-eval only today    PATIENT EDUCATION: Education details: Table slides Person educated: Patient Education method: Explanation, Demonstration, and Handouts Education comprehension: verbalized understanding and returned demonstration  HOME EXERCISE PROGRAM: Eval: table slides  GOALS: Goals reviewed with patient? Yes  SHORT TERM GOALS: Target date: 03/17/23  Pt will be provided with and educated on HEP to improve mobility in LUE required for use during ADL completion.   Goal status: INITIAL  2.  Pt will increase LUE P/ROM by 40 degrees or greater to improve ability to use LUE during dressing tasks with minimal compensatory techniques.   Goal status: INITIAL  3.  Pt will increase LUE strength to 3+/5 to improve ability to reach for items at waist to chest height during bathing and grooming tasks.   Goal status: INITIAL     LONG TERM GOALS: Target date: 04/18/23  Pt will decrease pain in LUE to 3/10 or less to improve ability to sleep for 2+ consecutive hours without waking due to pain.   Goal  status: INITIAL  2.  Pt will decrease LUE fascial restrictions to min amounts or less to improve mobility required for functional reaching tasks.   Goal status: INITIAL  3.  Pt will increase LUE A/ROM to at least 145 for flexion/abduction, and 50 degrees er/IR to improve ability to use  LUE when reaching overhead or behind back during dressing and bathing tasks.   Goal status: INITIAL  4.  Pt will increase LUE strength to 4+/5 or greater to improve ability to use LUE when lifting or carrying items during meal preparation/housework/yardwork tasks.   Goal status: INITIAL  5.  Pt will return to highest level of function using LUE as non-dominant during functional task completion.   Goal status: INITIAL  ASSESSMENT:  CLINICAL IMPRESSION: Patient is a 57 y.o. male who was seen today for occupational therapy evaluation s/p left arthroscopic rotator cuff repair of massive superior cuff tear, with open repair of subscapularis on 01/13/23. Pt presents in abduction sling, reports he has only taken the sling off for dressing and bathing and unbuckles it if propped up on a pillow resting. Pt is unable to use LUE for functional task completion at this time. Educated on HEP to begin passive stretching of LUE, encouraged to continue with pendulums given by MD.  PERFORMANCE DEFICITS: in functional skills including ADLs, IADLs, ROM, strength, pain, fascial restrictions, and UE functional use  IMPAIRMENTS: are limiting patient from ADLs, IADLs, rest and sleep, and leisure.   COMORBIDITIES: has no other co-morbidities that affects occupational performance. Patient will benefit from skilled OT to address above impairments and improve overall function.  MODIFICATION OR ASSISTANCE TO COMPLETE EVALUATION: No modification of tasks or assist necessary to complete an evaluation.  OT OCCUPATIONAL PROFILE AND HISTORY: Problem focused assessment: Including review of records relating to presenting  problem.  CLINICAL DECISION MAKING: LOW - limited treatment options, no task modification necessary  REHAB POTENTIAL: Good  EVALUATION COMPLEXITY: Low      PLAN:  OT FREQUENCY: 2x/week  OT DURATION: 8 weeks  PLANNED INTERVENTIONS: self care/ADL training, therapeutic exercise, therapeutic activity, manual therapy, passive range of motion, splinting, electrical stimulation, ultrasound, moist heat, cryotherapy, patient/family education, and DME and/or AE instructions  RECOMMENDED OTHER SERVICES: None at this time  CONSULTED AND AGREED WITH PLAN OF CARE: Patient  PLAN FOR NEXT SESSION: Follow up on HEP, begin protocol Phase II-manual techniques, P/ROM   Guadelupe Sabin, OTR/L  [phone] 02/17/2023, 3:26 PM

## 2023-02-18 ENCOUNTER — Encounter (HOSPITAL_COMMUNITY): Payer: Self-pay | Admitting: Occupational Therapy

## 2023-02-18 ENCOUNTER — Ambulatory Visit (HOSPITAL_COMMUNITY): Payer: Federal, State, Local not specified - PPO | Attending: Orthopedic Surgery | Admitting: Occupational Therapy

## 2023-02-18 DIAGNOSIS — M25612 Stiffness of left shoulder, not elsewhere classified: Secondary | ICD-10-CM | POA: Insufficient documentation

## 2023-02-18 DIAGNOSIS — R29898 Other symptoms and signs involving the musculoskeletal system: Secondary | ICD-10-CM | POA: Diagnosis not present

## 2023-02-18 DIAGNOSIS — M25512 Pain in left shoulder: Secondary | ICD-10-CM | POA: Insufficient documentation

## 2023-02-18 NOTE — Therapy (Addendum)
OUTPATIENT OCCUPATIONAL THERAPY ORTHO TREATMENT  Patient Name: Brian Newton MRN: [id number] DOB:Jan 19, 1966, 57 y.o., male Today's Date: 02/18/2023  PCP: Dr. Jenna Luo  REFERRING PROVIDER: Dr. Larena Glassman  END OF SESSION:  OT End of Session - 02/18/23 1441     Visit Number 2    Number of Visits 16    Date for OT Re-Evaluation 04/18/23    Authorization Type [employer]    Authorization Time Period 98 visit limit    Authorization - Visit Number 2    Authorization - Number of Visits 18    OT Start Time [id number]    OT Stop Time 1423    OT Time Calculation (min) 38 min    Activity Tolerance Patient tolerated treatment well    Behavior During Therapy WFL for tasks assessed/performed              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)    Wrist pain    Past Surgical History:  Procedure Laterality Date   APPENDECTOMY  1986   ARTHOSCOPIC ROTAOR CUFF REPAIR Left 01/13/2023   Procedure: ARTHROSCOPIC ROTATOR CUFF REPAIR;  Surgeon: [name], MD;  Location: [facility];  Service: Orthopedics;  Laterality: Left;   BREATH TEK H PYLORI N/A 08/08/2014   Procedure: BREATH TEK H PYLORI;  Surgeon: Edward Jolly, MD;  Location: [facility];  Service: General;  Laterality: N/A;   COLONOSCOPY N/A 03/02/2013   Procedure: COLONOSCOPY;  Surgeon: Beryle Beams, MD;  Location: [facility];  Service: Endoscopy;  Laterality: N/A;   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   Procedure: LAPAROSCOPIC ROUX-EN-Y GASTRIC BYPASS WITH UPPER ENDOSCOPY;  Surgeon: Excell Seltzer, MD;  Location: [facility];  Service: General;  Laterality: N/A;   KNEE ARTHROSCOPY  2001   rt knee   OPEN SUBSCAPULARIS REPAIR  01/13/2023   Procedure: OPEN SUBSCAPULARIS REPAIR;  Surgeon:  [name], MD;  Location: [facility];  Service: Orthopedics;;   SHOULDER ARTHROSCOPY WITH BICEPSTENOTOMY  01/13/2023   Procedure: SHOULDER ARTHROSCOPY WITH BICEPS TENOTOMY;  Surgeon: [name], MD;  Location: [facility];  Service: Orthopedics;;   UPPER GI ENDOSCOPY  11/05/2014   Procedure: UPPER GI ENDOSCOPY;  Surgeon: Excell Seltzer, MD;  Location: [facility];  Service: General;;   Patient Active Problem List   Diagnosis Date Noted   History of colonic polyps 11/27/2021   Morbid obesity ([place]) 04/01/2014   Hypercholesteremia    Hypertension    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  Pt accompanied by: self  PERTINENT HISTORY: Pt is a 57 y/o male s/p s/p left arthroscopic rotator cuff repair of massive superior cuff tear, with open repair of subscapularis on 01/13/23. Pt  noted to have a massive tear and is on a conservative protocol.   PRECAUTIONS: Shoulder, see protocol for massive tear  WEIGHT BEARING RESTRICTIONS: Yes NWB  PAIN:  Are you having pain? No  FALLS: Has patient fallen in last 6 months? Yes. Number of falls 1  PLOF: Independent  PATIENT GOALS: To be able to use the LUE.   NEXT MD VISIT: 03/08/23  OBJECTIVE:   HAND DOMINANCE: Right  ADLs: Overall ADLs: Pt is having difficulty with sleeping,   FUNCTIONAL OUTCOME MEASURES: FOTO: 40/100  UPPER EXTREMITY ROM:     Assessed supine, er/IR adducted  Passive ROM Left eval  Shoulder flexion 128   Shoulder abduction 82  Shoulder internal rotation 90  Shoulder external rotation 52  (Blank rows = not tested)  Active ROM Left eval  Shoulder flexion   Shoulder abduction   Shoulder internal rotation   Shoulder external rotation   (Blank rows = not tested)   UPPER EXTREMITY MMT:         MMT Left eval  Shoulder flexion   Shoulder abduction   Shoulder internal rotation   Shoulder external rotation   (Blank rows = not tested)   SENSATION: WFL  EDEMA: Edema observed at left upper arm and anterior shoulder  COGNITION: Overall cognitive status: Within functional limits for tasks assessed  OBSERVATIONS: Mod fascial restrictions noted in left upper arm, anterior shoulder, trapezius, and scapular regions   TODAY'S TREATMENT:                                                                                                                              DATE:   02/18/2023  -  Myofascial release to deltoid, upper biceps, posterior traps, axillary region to decrease fascial restrictions, and increase joint ROM- noted increased restrictions to upper deltoid  - P/ROM: supine, shoulder flexion, abduction, 10x each way  - Pendulums: leaned over swinging L arm in circular motion     PATIENT EDUCATION: Education details: Table slides Person educated: Patient Education method: Explanation, Demonstration, and Handouts Education comprehension: verbalized understanding and returned demonstration  HOME EXERCISE PROGRAM: Eval: table slides  GOALS: Goals reviewed with patient? Yes  SHORT TERM GOALS: Target date: 03/17/23  Pt will be provided with and educated on HEP to improve mobility in LUE required for use during ADL completion.   Goal status: INITIAL  2.  Pt will increase LUE P/ROM by 40 degrees or greater to improve ability to use LUE during dressing tasks with minimal compensatory techniques.   Goal status: INITIAL  3.  Pt will increase LUE strength to 3+/5 to improve  ability to reach for items at waist to chest height during bathing and grooming tasks.   Goal status: INITIAL     LONG TERM GOALS: Target date: 04/18/23  Pt will decrease pain in LUE to 3/10 or less to improve ability to sleep for 2+ consecutive hours without waking due to pain.  Goal status: INITIAL  2.  Pt will decrease LUE fascial restrictions to min amounts or less to improve mobility required for functional reaching tasks.   Goal status: INITIAL  3.  Pt will increase LUE A/ROM to at least 145 for flexion/abduction, and 50 degrees er/IR to improve ability to use LUE when reaching overhead or behind back during dressing and bathing tasks.   Goal status: INITIAL  4.  Pt will increase LUE strength to 4+/5 or greater to improve ability to use LUE when lifting or carrying items during meal preparation/housework/yardwork tasks.   Goal status: INITIAL  5.  Pt will return to highest level of function using LUE as non-dominant during functional task completion.   Goal status: INITIAL  ASSESSMENT:  CLINICAL IMPRESSION: Pt presents to appointment in abduction sling. Pt reports that he has been completing table slides and pendulums at home without pain. According to shoulder protocol, he is in phase 2 of rehabilitation. Progressed to P/ROM while in supine. He was able to tolerate slightly more than 50% ROM in flexion and abduction. Throughout shoulder flexion, traction was applied when coming down into extension due to pain. Instructed pt to continue with HEP at home (pendulum/table slides).    PERFORMANCE DEFICITS: in functional skills including ADLs, IADLs, ROM, strength, pain, fascial restrictions, and UE functional use  IMPAIRMENTS: are limiting patient from ADLs, IADLs, rest and sleep, and leisure.   COMORBIDITIES: has no other co-morbidities that affects occupational performance. Patient will benefit from skilled OT to address above impairments and improve overall  function.  MODIFICATION OR ASSISTANCE TO COMPLETE EVALUATION: No modification of tasks or assist necessary to complete an evaluation.  OT OCCUPATIONAL PROFILE AND HISTORY: Problem focused assessment: Including review of records relating to presenting problem.  CLINICAL DECISION MAKING: LOW - limited treatment options, no task modification necessary  REHAB POTENTIAL: Good  EVALUATION COMPLEXITY: Low      PLAN:  OT FREQUENCY: 2x/week  OT DURATION: 8 weeks  PLANNED INTERVENTIONS: self care/ADL training, therapeutic exercise, therapeutic activity, manual therapy, passive range of motion, splinting, electrical stimulation, ultrasound, moist heat, cryotherapy, patient/family education, and DME and/or AE instructions  RECOMMENDED OTHER SERVICES: None at this time  CONSULTED AND AGREED WITH PLAN OF CARE: Patient  PLAN FOR NEXT SESSION: Follow up on HEP, continue protocol Phase II-manual techniques, P/ROM   Arvil Persons, OTR/L  [phone] 02/18/2023, 2:46 PM

## 2023-02-21 ENCOUNTER — Ambulatory Visit (HOSPITAL_COMMUNITY): Payer: Federal, State, Local not specified - PPO | Admitting: Occupational Therapy

## 2023-02-21 ENCOUNTER — Encounter (HOSPITAL_COMMUNITY): Payer: Self-pay | Admitting: Occupational Therapy

## 2023-02-21 DIAGNOSIS — M25512 Pain in left shoulder: Secondary | ICD-10-CM | POA: Diagnosis not present

## 2023-02-21 DIAGNOSIS — R29898 Other symptoms and signs involving the musculoskeletal system: Secondary | ICD-10-CM

## 2023-02-21 DIAGNOSIS — M25612 Stiffness of left shoulder, not elsewhere classified: Secondary | ICD-10-CM | POA: Diagnosis not present

## 2023-02-21 NOTE — Therapy (Signed)
OUTPATIENT OCCUPATIONAL THERAPY ORTHO TREATMENT NOTE  Patient Name: Brian Newton MRN: [id number] DOB:02/14/1966, 57 y.o., male Today's Date: 02/21/2023  PCP: Dr. Jenna Luo  REFERRING PROVIDER: Dr. Larena Glassman  END OF SESSION:  OT End of Session - 02/21/23 1347     Visit Number 3    Number of Visits 16    Date for OT Re-Evaluation 04/18/23    Authorization Type [employer]    Authorization Time Period 40 visit limit    Authorization - Visit Number 2    Authorization - Number of Visits 50    Progress Note Due on Visit 10    OT Start Time 1350    OT Stop Time 1430    OT Time Calculation (min) 40 min    Activity Tolerance Patient tolerated treatment well    Behavior During Therapy WFL for tasks assessed/performed             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)    Wrist pain    Past Surgical History:  Procedure Laterality Date   APPENDECTOMY  1986   ARTHOSCOPIC ROTAOR CUFF REPAIR Left 01/13/2023   Procedure: ARTHROSCOPIC ROTATOR CUFF REPAIR;  Surgeon: [name], MD;  Location: [facility];  Service: Orthopedics;  Laterality: Left;   BREATH TEK H PYLORI N/A 08/08/2014   Procedure: BREATH TEK H PYLORI;  Surgeon: Edward Jolly, MD;  Location: [facility];  Service: General;  Laterality: N/A;   COLONOSCOPY N/A 03/02/2013   Procedure: COLONOSCOPY;  Surgeon: Beryle Beams, MD;  Location: [facility];  Service: Endoscopy;  Laterality: N/A;   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   Procedure: LAPAROSCOPIC ROUX-EN-Y GASTRIC BYPASS WITH UPPER ENDOSCOPY;  Surgeon: Excell Seltzer, MD;  Location: [facility];  Service: General;  Laterality: N/A;   KNEE ARTHROSCOPY  2001   rt knee   OPEN SUBSCAPULARIS REPAIR  01/13/2023   Procedure: OPEN  SUBSCAPULARIS REPAIR;  Surgeon: [name], MD;  Location: [facility];  Service: Orthopedics;;   SHOULDER ARTHROSCOPY WITH BICEPSTENOTOMY  01/13/2023   Procedure: SHOULDER ARTHROSCOPY WITH BICEPS TENOTOMY;  Surgeon: [name], MD;  Location: [facility];  Service: Orthopedics;;   UPPER GI ENDOSCOPY  11/05/2014   Procedure: UPPER GI ENDOSCOPY;  Surgeon: Excell Seltzer, MD;  Location: [facility];  Service: General;;   Patient Active Problem List   Diagnosis Date Noted   History of colonic polyps 11/27/2021   Morbid obesity ([place]) 04/01/2014   Hypercholesteremia    Hypertension    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  Pt accompanied by: self  PERTINENT HISTORY: Pt is a 57 y/o male s/p s/p left arthroscopic rotator cuff repair of massive superior cuff  tear, with open repair of subscapularis on 01/13/23. Pt noted to have a massive tear and is on a conservative protocol.   PRECAUTIONS: Shoulder, see protocol for massive tear  WEIGHT BEARING RESTRICTIONS: Yes NWB  PAIN:  Are you having pain? No  FALLS: Has patient fallen in last 6 months? Yes. Number of falls 1  PLOF: Independent  PATIENT GOALS: To be able to use the LUE.   NEXT MD VISIT: 03/08/23  OBJECTIVE:   HAND DOMINANCE: Right  ADLs: Overall ADLs: Pt is having difficulty with sleeping,   FUNCTIONAL OUTCOME MEASURES: FOTO: 40/100  UPPER EXTREMITY ROM:     Assessed supine, er/IR adducted  Passive ROM  Left eval  Shoulder flexion 128  Shoulder abduction 82  Shoulder internal rotation 90  Shoulder external rotation 52  (Blank rows = not tested)  Active ROM Left eval  Shoulder flexion   Shoulder abduction   Shoulder internal rotation   Shoulder external rotation   (Blank rows = not tested)   UPPER EXTREMITY MMT:         MMT Left eval  Shoulder flexion   Shoulder abduction   Shoulder internal rotation   Shoulder external rotation   (Blank rows = not tested)   SENSATION: WFL  EDEMA: Edema observed at left upper arm and anterior shoulder  COGNITION: Overall cognitive status: Within functional limits for tasks assessed  OBSERVATIONS: Mod fascial restrictions noted in left upper arm, anterior shoulder, trapezius, and scapular regions   TODAY'S TREATMENT:                                                                                                                              DATE:   02/21/23 -  Myofascial release to deltoid, upper biceps, posterior traps, axillary region to decrease fascial restrictions, and increase joint ROM- noted increased restrictions to upper deltoid  - P/ROM: supine, shoulder flexion, abduction, 10x each way  -Table slides: Flexion and abduction, x10 -Isometrics: flexion, abduction, internal rotation, 5x10-15" -Scapular ROM: elevation/depression, protraction/retraction, x10  02/18/2023 -  Myofascial release to deltoid, upper biceps, posterior traps, axillary region to decrease fascial restrictions, and increase joint ROM- noted increased restrictions to upper deltoid  - P/ROM: supine, shoulder flexion, abduction, 10x each way  - Pendulums: leaned over swinging L arm in circular motion     PATIENT EDUCATION: Education details: Isometrics - flexion, abduction, internal rotation only Person educated: Patient Education method: Consulting civil engineer, Demonstration, and Handouts Education comprehension: verbalized understanding and returned  demonstration  HOME EXERCISE PROGRAM: Eval: table slides 3/4: Isometrics - flexion, abduction, internal rotation only   GOALS: Goals reviewed with patient? Yes  SHORT TERM GOALS: Target date: 03/17/23  Pt will be provided with and educated on HEP to improve mobility in LUE required for use during ADL completion.   Goal status: IN PROGRESS  2.  Pt will increase LUE P/ROM by 40 degrees or greater to improve ability to use LUE during dressing  tasks with minimal compensatory techniques.   Goal status: IN PROGRESS  3.  Pt will increase LUE strength to 3+/5 to improve ability to reach for items at waist to chest height during bathing and grooming tasks.   Goal status: IN PROGRESS     LONG TERM GOALS: Target date: 04/18/23  Pt will decrease pain in LUE to 3/10 or less to improve ability to sleep for 2+ consecutive hours without waking due to pain.   Goal status: IN PROGRESS  2.  Pt will decrease LUE fascial restrictions to min amounts or less to improve mobility required for functional reaching tasks.   Goal status: IN PROGRESS  3.  Pt will increase LUE A/ROM to at least 145 for flexion/abduction, and 50 degrees er/IR to improve ability to use LUE when reaching overhead or behind back during dressing and bathing tasks.   Goal status: IN PROGRESS  4.  Pt will increase LUE strength to 4+/5 or greater to improve ability to use LUE when lifting or carrying items during meal preparation/housework/yardwork tasks.   Goal status: IN PROGRESS  5.  Pt will return to highest level of function using LUE as non-dominant during functional task completion.   Goal status: IN PROGRESS  ASSESSMENT:  CLINICAL IMPRESSION: Pt continuing to remain consistent with his sling wearing, as well as completing his HEP for pendulums and table slides, along with consistent ROM of the elbow, wrist, and hand. This session he continued to work on low level ROM, P/ROM, and manual to reduce fascial  restrictions. Verbal and tactile cuing provided throughout session for positioning and technique, as well as education on sleeping positions for comfort and safety.    PERFORMANCE DEFICITS: in functional skills including ADLs, IADLs, ROM, strength, pain, fascial restrictions, and UE functional use   PLAN:  OT FREQUENCY: 2x/week  OT DURATION: 8 weeks  PLANNED INTERVENTIONS: self care/ADL training, therapeutic exercise, therapeutic activity, manual therapy, passive range of motion, splinting, electrical stimulation, ultrasound, moist heat, cryotherapy, patient/family education, and DME and/or AE instructions  RECOMMENDED OTHER SERVICES: None at this time  CONSULTED AND AGREED WITH PLAN OF CARE: Patient  PLAN FOR NEXT SESSION: Follow up on HEP, continue protocol Phase II-manual techniques, P/ROM, thumb tacs, basic [place], OTR/L [phone] 02/21/2023, 1:47 PM

## 2023-02-21 NOTE — Patient Instructions (Signed)
  Complete the following 2 a day. Hold for 10-15 seconds. Complete 5-8 sets for each.   1) SHOULDER - ISOMETRIC FLEXION  Gently push your fist forward into a wall with your elbow bent.    2) SHOULDER - ISOMETRIC EXTENSION  Gently push your a bent elbow back into a wall.    3) SHOULDER - ISOMETRIC INTERNAL ROTATION   Gently press your hand into a wall using the palm side of your hand.  Maintain a bent elbow the entire time.        4) SHOULDER - ISOMETRIC ADDUCTION  Gently push your elbow into the side of your body.   5) SHOULDER - ISOMETRIC ABDUCTION  Gently push your elbow out to the side into a wall with your elbow bent.    

## 2023-02-23 DIAGNOSIS — K1370 Unspecified lesions of oral mucosa: Secondary | ICD-10-CM | POA: Diagnosis not present

## 2023-02-25 ENCOUNTER — Encounter (HOSPITAL_COMMUNITY): Payer: Self-pay | Admitting: Occupational Therapy

## 2023-02-25 ENCOUNTER — Ambulatory Visit (HOSPITAL_COMMUNITY): Payer: Federal, State, Local not specified - PPO | Admitting: Occupational Therapy

## 2023-02-25 DIAGNOSIS — R29898 Other symptoms and signs involving the musculoskeletal system: Secondary | ICD-10-CM | POA: Diagnosis not present

## 2023-02-25 DIAGNOSIS — M25512 Pain in left shoulder: Secondary | ICD-10-CM | POA: Diagnosis not present

## 2023-02-25 DIAGNOSIS — M25612 Stiffness of left shoulder, not elsewhere classified: Secondary | ICD-10-CM

## 2023-02-25 NOTE — Therapy (Signed)
OUTPATIENT OCCUPATIONAL THERAPY ORTHO TREATMENT NOTE  Patient Name: Brian Newton MRN: [id number] DOB:Feb 22, 1966, 57 y.o., male Today's Date: 02/25/2023  PCP: Dr. Jenna Luo  REFERRING PROVIDER: Dr. Larena Glassman  END OF SESSION:  OT End of Session - 02/25/23 1423     Visit Number 4    Number of Visits 16    Date for OT Re-Evaluation 04/18/23    Authorization Type [employer]    Authorization Time Period 22 visit limit    Authorization - Visit Number 3    Authorization - Number of Visits 50    Progress Note Due on Visit 10    OT Start Time 1340    OT Stop Time 1421    OT Time Calculation (min) 41 min    Activity Tolerance Patient tolerated treatment well    Behavior During Therapy WFL for tasks assessed/performed              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)    Wrist pain    Past Surgical History:  Procedure Laterality Date   APPENDECTOMY  1986   ARTHOSCOPIC ROTAOR CUFF REPAIR Left 01/13/2023   Procedure: ARTHROSCOPIC ROTATOR CUFF REPAIR;  Surgeon: [name], MD;  Location: [facility];  Service: Orthopedics;  Laterality: Left;   BREATH TEK H PYLORI N/A 08/08/2014   Procedure: BREATH TEK H PYLORI;  Surgeon: Edward Jolly, MD;  Location: [facility];  Service: General;  Laterality: N/A;   COLONOSCOPY N/A 03/02/2013   Procedure: COLONOSCOPY;  Surgeon: Beryle Beams, MD;  Location: [facility];  Service: Endoscopy;  Laterality: N/A;   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   Procedure: LAPAROSCOPIC ROUX-EN-Y GASTRIC BYPASS WITH UPPER ENDOSCOPY;  Surgeon: Excell Seltzer, MD;  Location: [facility];  Service: General;  Laterality: N/A;   KNEE ARTHROSCOPY  2001   rt knee   OPEN SUBSCAPULARIS REPAIR  01/13/2023   Procedure: OPEN  SUBSCAPULARIS REPAIR;  Surgeon: [name], MD;  Location: [facility];  Service: Orthopedics;;   SHOULDER ARTHROSCOPY WITH BICEPSTENOTOMY  01/13/2023   Procedure: SHOULDER ARTHROSCOPY WITH BICEPS TENOTOMY;  Surgeon: [name], MD;  Location: [facility];  Service: Orthopedics;;   UPPER GI ENDOSCOPY  11/05/2014   Procedure: UPPER GI ENDOSCOPY;  Surgeon: Excell Seltzer, MD;  Location: [facility];  Service: General;;   Patient Active Problem List   Diagnosis Date Noted   History of colonic polyps 11/27/2021   Morbid obesity ([place]) 04/01/2014   Hypercholesteremia    Hypertension    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  Pt accompanied by: self  PERTINENT HISTORY: Pt is a 57 y/o male s/p s/p left arthroscopic rotator cuff repair of massive superior cuff tear, with open repair of  subscapularis on 01/13/23. Pt noted to have a massive tear and is on a conservative protocol.   PRECAUTIONS: Shoulder, see protocol for massive tear  WEIGHT BEARING RESTRICTIONS: Yes NWB  PAIN:  Are you having pain? No  FALLS: Has patient fallen in last 6 months? Yes. Number of falls 1  PLOF: Independent  PATIENT GOALS: To be able to use the LUE.   NEXT MD VISIT: 03/08/23  OBJECTIVE:   HAND DOMINANCE: Right  ADLs: Overall ADLs: Pt is having difficulty with sleeping,   FUNCTIONAL OUTCOME MEASURES: FOTO: 40/100  UPPER EXTREMITY ROM:     Assessed supine, er/IR adducted  Passive ROM Left eval  Shoulder flexion 128   Shoulder abduction 82  Shoulder internal rotation 90  Shoulder external rotation 52  (Blank rows = not tested)  Active ROM Left eval  Shoulder flexion   Shoulder abduction   Shoulder internal rotation   Shoulder external rotation   (Blank rows = not tested)   UPPER EXTREMITY MMT:         MMT Left eval  Shoulder flexion   Shoulder abduction   Shoulder internal rotation   Shoulder external rotation   (Blank rows = not tested)   SENSATION: WFL  EDEMA: Edema observed at left upper arm and anterior shoulder  COGNITION: Overall cognitive status: Within functional limits for tasks assessed  OBSERVATIONS: Mod fascial restrictions noted in left upper arm, anterior shoulder, trapezius, and scapular regions   TODAY'S TREATMENT:                                                                                                                              DATE:   02/25/2023 -  Myofascial release to deltoid, upper biceps, posterior traps, axillary region to decrease fascial restrictions, and increase joint ROM- noted increased restrictions to upper deltoid  - P/ROM: supine, shoulder flexion, abduction, 10x each way  -Scapular ROM: elevation/depression, protraction/retraction, x10 -Isometrics: flexion, abduction, internal rotation, 5x10-15" - Stretching: ball rolls, flexion 10x, abduction 10x   02/21/23 -  Myofascial release to deltoid, upper biceps, posterior traps, axillary region to decrease fascial restrictions, and increase joint ROM- noted increased restrictions to upper deltoid  - P/ROM: supine, shoulder flexion, abduction, 10x each way  -Table slides: Flexion and abduction, x10 -Isometrics: flexion, abduction, internal rotation, 5x10-15" -Scapular ROM: elevation/depression, protraction/retraction, x10  02/18/2023 -  Myofascial release to deltoid, upper biceps, posterior traps, axillary region to decrease fascial restrictions, and increase joint ROM- noted increased  restrictions to upper deltoid  - P/ROM: supine, shoulder flexion, abduction, 10x each way  - Pendulums: leaned over swinging L arm in circular motion     PATIENT EDUCATION: Education details: Isometrics - flexion, abduction, internal rotation only Person educated: Patient Education method: Consulting civil engineer, Demonstration, and Handouts Education comprehension: verbalized understanding and returned demonstration  HOME EXERCISE PROGRAM: Eval: table slides 3/4: Isometrics - flexion, abduction, internal rotation only   GOALS: Goals reviewed with patient? Yes  SHORT TERM GOALS: Target date: 03/17/23  Pt will be provided with and educated on HEP to improve mobility in LUE required for use during ADL completion.   Goal status: IN PROGRESS  2.  Pt will increase LUE P/ROM by 40 degrees or greater to improve ability to use LUE during dressing tasks with minimal compensatory techniques.   Goal status: IN PROGRESS  3.  Pt will increase LUE strength to 3+/5 to improve ability to reach for items at waist to chest height during bathing and grooming tasks.   Goal status: IN PROGRESS     LONG TERM GOALS: Target date: 04/18/23  Pt will decrease pain in LUE to 3/10 or less to improve ability to sleep for 2+ consecutive hours without waking due to pain.   Goal status: IN PROGRESS  2.  Pt will decrease LUE fascial restrictions to min amounts or less to improve mobility required for functional reaching tasks.   Goal status: IN PROGRESS  3.  Pt will increase LUE A/ROM to at least 145 for flexion/abduction, and 50 degrees er/IR to improve ability to use LUE when reaching overhead or behind back during dressing and bathing tasks.   Goal status: IN PROGRESS  4.  Pt will increase LUE strength to 4+/5 or greater to improve ability to use LUE when lifting or carrying items during meal preparation/housework/yardwork tasks.   Goal status: IN PROGRESS  5.  Pt will return to highest level of function  using LUE as non-dominant during functional task completion.   Goal status: IN PROGRESS  ASSESSMENT:  CLINICAL IMPRESSION: Pt reports he has been completing his table slide exercises 5x daily and isometric exercises 3x daily- pt brought videos of exercises to ensure he was completing correctly. Completed manual techniques at start of session to decrease fascial restrictions- noted moderate restrictions in axillary and bicep region. Added flexion and abduction ball rolls.    PERFORMANCE DEFICITS: in functional skills including ADLs, IADLs, ROM, strength, pain, fascial restrictions, and UE functional use   PLAN:  OT FREQUENCY: 2x/week  OT DURATION: 8 weeks  PLANNED INTERVENTIONS: self care/ADL training, therapeutic exercise, therapeutic activity, manual therapy, passive range of motion, splinting, electrical stimulation, ultrasound, moist heat, cryotherapy, patient/family education, and DME and/or AE instructions  RECOMMENDED OTHER SERVICES: None at this time  CONSULTED AND AGREED WITH PLAN OF CARE: Patient  PLAN FOR NEXT SESSION: Follow up on HEP, continue protocol Phase II-manual techniques, P/ROM, thumb tacs, basic [place], OTR/L [phone] 02/25/2023, 2:24 PM

## 2023-03-02 ENCOUNTER — Encounter (HOSPITAL_COMMUNITY): Payer: Self-pay | Admitting: Occupational Therapy

## 2023-03-02 ENCOUNTER — Ambulatory Visit (HOSPITAL_COMMUNITY): Payer: Federal, State, Local not specified - PPO | Admitting: Occupational Therapy

## 2023-03-02 DIAGNOSIS — M25612 Stiffness of left shoulder, not elsewhere classified: Secondary | ICD-10-CM

## 2023-03-02 DIAGNOSIS — R29898 Other symptoms and signs involving the musculoskeletal system: Secondary | ICD-10-CM

## 2023-03-02 DIAGNOSIS — M25512 Pain in left shoulder: Secondary | ICD-10-CM | POA: Diagnosis not present

## 2023-03-02 NOTE — Therapy (Signed)
OUTPATIENT OCCUPATIONAL THERAPY ORTHO TREATMENT NOTE  Patient Name: Brian Newton MRN: [id number] DOB:[birth year], 57 y.o., male Today's Date: 03/02/2023  PCP: Dr. Jenna Luo  REFERRING PROVIDER: Dr. Larena Glassman  END OF SESSION:  OT End of Session - 03/02/23 0900     Visit Number 5    Number of Visits 16    Date for OT Re-Evaluation 04/18/23    Authorization Type [employer]    Authorization Time Period 5 visit limit    Authorization - Visit Number 4    Authorization - Number of Visits 50    Progress Note Due on Visit 10    OT Start Time 0900    OT Stop Time 0940    OT Time Calculation (min) 40 min    Activity Tolerance Patient tolerated treatment well    Behavior During Therapy WFL for tasks assessed/performed              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)    Wrist pain    Past Surgical History:  Procedure Laterality Date   APPENDECTOMY  1986   ARTHOSCOPIC ROTAOR CUFF REPAIR Left 01/13/2023   Procedure: ARTHROSCOPIC ROTATOR CUFF REPAIR;  Surgeon: [name], MD;  Location: [facility];  Service: Orthopedics;  Laterality: Left;   BREATH TEK H PYLORI N/A 08/08/2014   Procedure: BREATH TEK H PYLORI;  Surgeon: Edward Jolly, MD;  Location: [facility];  Service: General;  Laterality: N/A;   COLONOSCOPY N/A 03/02/2013   Procedure: COLONOSCOPY;  Surgeon: Beryle Beams, MD;  Location: [facility];  Service: Endoscopy;  Laterality: N/A;   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   Procedure: LAPAROSCOPIC ROUX-EN-Y GASTRIC BYPASS WITH UPPER ENDOSCOPY;  Surgeon: Excell Seltzer, MD;  Location: [facility];  Service: General;  Laterality: N/A;   KNEE ARTHROSCOPY  2001   rt knee   OPEN SUBSCAPULARIS REPAIR  01/13/2023   Procedure:  OPEN SUBSCAPULARIS REPAIR;  Surgeon: [name], MD;  Location: [facility];  Service: Orthopedics;;   SHOULDER ARTHROSCOPY WITH BICEPSTENOTOMY  01/13/2023   Procedure: SHOULDER ARTHROSCOPY WITH BICEPS TENOTOMY;  Surgeon: [name], MD;  Location: [facility];  Service: Orthopedics;;   UPPER GI ENDOSCOPY  11/05/2014   Procedure: UPPER GI ENDOSCOPY;  Surgeon: Excell Seltzer, MD;  Location: [facility];  Service: General;;   Patient Active Problem List   Diagnosis Date Noted   History of colonic polyps 11/27/2021   Morbid obesity ([place]) 04/01/2014   Hypercholesteremia    Hypertension    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 ([place])    Metabolic syndrome     ONSET DATE: 01/13/23  REFERRING DIAG: s/p left arthroscopic rotator cuff repair of massive superior cuff tear, with open repair of subscapularis   THERAPY DIAG:  Acute pain of left shoulder  Stiffness of left shoulder, not elsewhere classified  Other symptoms and signs involving the musculoskeletal system  Rationale for Evaluation and Treatment: Rehabilitation  SUBJECTIVE:   SUBJECTIVE STATEMENT: S: "The sling has been pulling badly on my neck"  Pt accompanied by: self  PERTINENT HISTORY: Pt is a 57 y/o male s/p s/p left arthroscopic rotator cuff repair of massive superior cuff tear, with open repair of  subscapularis on 01/13/23. Pt noted to have a massive tear and is on a conservative protocol.   PRECAUTIONS: Shoulder, see protocol for massive tear  WEIGHT BEARING RESTRICTIONS: Yes NWB  PAIN:  Are you having pain? No  FALLS: Has patient fallen in last 6 months? Yes. Number of falls 1  PLOF: Independent  PATIENT GOALS: To be able to use the LUE.   NEXT MD VISIT: 03/08/23  OBJECTIVE:   HAND DOMINANCE: Right  ADLs: Overall ADLs: Pt is having difficulty with sleeping,   FUNCTIONAL OUTCOME MEASURES: FOTO: 40/100  UPPER EXTREMITY ROM:     Assessed supine, er/IR adducted  Passive ROM Left eval  Shoulder  flexion 128  Shoulder abduction 82  Shoulder internal rotation 90  Shoulder external rotation 52  (Blank rows = not tested)  Active ROM Left eval  Shoulder flexion   Shoulder abduction   Shoulder internal rotation   Shoulder external rotation   (Blank rows = not tested)   UPPER EXTREMITY MMT:         MMT Left eval  Shoulder flexion   Shoulder abduction   Shoulder internal rotation   Shoulder external rotation   (Blank rows = not tested)   SENSATION: WFL  EDEMA: Edema observed at left upper arm and anterior shoulder  COGNITION: Overall cognitive status: Within functional limits for tasks assessed  OBSERVATIONS: Mod fascial restrictions noted in left upper arm, anterior shoulder, trapezius, and scapular regions   TODAY'S TREATMENT:                                                                                                                              DATE:   03/02/23 - Myofascial release to deltoid, upper biceps, posterior traps, axillary region to decrease fascial restrictions, and increase joint ROM- noted increased restrictions to upper deltoid  - P/ROM: supine, shoulder flexion, abduction, 10x each way  - Thumbtacs: 2x45" - Shoulder rolls x10 - Table Slides: flexion, abduction, x10  02/25/2023 -  Myofascial release to deltoid, upper biceps, posterior traps, axillary region to decrease fascial restrictions, and increase joint ROM- noted increased restrictions to upper deltoid  - P/ROM: supine, shoulder flexion, abduction, 10x each way  -Scapular ROM: elevation/depression, protraction/retraction, x10 -Isometrics: flexion, abduction, internal rotation, 5x10-15" - Stretching: ball rolls, flexion 10x, abduction 10x   02/21/23 -  Myofascial release to deltoid, upper biceps, posterior traps, axillary region to decrease fascial restrictions, and increase joint ROM- noted increased restrictions to upper deltoid  - P/ROM: supine, shoulder flexion, abduction, 10x each  way  -Table slides: Flexion and abduction, x10 -Isometrics: flexion, abduction, internal rotation, 5x10-15" -Scapular ROM: elevation/depression, protraction/retraction, x10    PATIENT EDUCATION: Education details: Reviewing HEP Person educated: Patient Education method: Explanation, Demonstration, and Handouts Education comprehension: verbalized understanding and returned demonstration  HOME EXERCISE PROGRAM: Eval: table slides 3/4: Isometrics - flexion, abduction, internal rotation only   GOALS: Goals reviewed with patient? Yes  SHORT TERM GOALS: Target  date: 03/17/23  Pt will be provided with and educated on HEP to improve mobility in LUE required for use during ADL completion.   Goal status: IN PROGRESS  2.  Pt will increase LUE P/ROM by 40 degrees or greater to improve ability to use LUE during dressing tasks with minimal compensatory techniques.   Goal status: IN PROGRESS  3.  Pt will increase LUE strength to 3+/5 to improve ability to reach for items at waist to chest height during bathing and grooming tasks.   Goal status: IN PROGRESS     LONG TERM GOALS: Target date: 04/18/23  Pt will decrease pain in LUE to 3/10 or less to improve ability to sleep for 2+ consecutive hours without waking due to pain.   Goal status: IN PROGRESS  2.  Pt will decrease LUE fascial restrictions to min amounts or less to improve mobility required for functional reaching tasks.   Goal status: IN PROGRESS  3.  Pt will increase LUE A/ROM to at least 145 for flexion/abduction, and 50 degrees er/IR to improve ability to use LUE when reaching overhead or behind back during dressing and bathing tasks.   Goal status: IN PROGRESS  4.  Pt will increase LUE strength to 4+/5 or greater to improve ability to use LUE when lifting or carrying items during meal preparation/housework/yardwork tasks.   Goal status: IN PROGRESS  5.  Pt will return to highest level of function using LUE as  non-dominant during functional task completion.   Goal status: IN PROGRESS  ASSESSMENT:  CLINICAL IMPRESSION: This session, pt continuing to work on passive ROM, manual therapy, and isometrics. His deltoid/bicep region was swollen and tight feeling this session, limiting him from reaching further than the original 128 degrees of flexion, however with abduction and external rotation his range is improving well passively. He completed 2 sets of thumb tacs this session with no complaints of fatigue. OT providing verbal and tactile cuing this session for positioning and technique.    PERFORMANCE DEFICITS: in functional skills including ADLs, IADLs, ROM, strength, pain, fascial restrictions, and UE functional use   PLAN:  OT FREQUENCY: 2x/week  OT DURATION: 8 weeks  PLANNED INTERVENTIONS: self care/ADL training, therapeutic exercise, therapeutic activity, manual therapy, passive range of motion, splinting, electrical stimulation, ultrasound, moist heat, cryotherapy, patient/family education, and DME and/or AE instructions  RECOMMENDED OTHER SERVICES: None at this time  CONSULTED AND AGREED WITH PLAN OF CARE: Patient  PLAN FOR NEXT SESSION: Follow up on HEP, continue protocol Phase II-manual techniques, P/ROM, thumb tacs, basic [place], OTR/L [phone] 03/02/2023, 9:01 AM

## 2023-03-04 ENCOUNTER — Ambulatory Visit (HOSPITAL_COMMUNITY): Payer: Federal, State, Local not specified - PPO | Admitting: Occupational Therapy

## 2023-03-04 DIAGNOSIS — M25612 Stiffness of left shoulder, not elsewhere classified: Secondary | ICD-10-CM

## 2023-03-04 DIAGNOSIS — R29898 Other symptoms and signs involving the musculoskeletal system: Secondary | ICD-10-CM

## 2023-03-04 DIAGNOSIS — M25512 Pain in left shoulder: Secondary | ICD-10-CM | POA: Diagnosis not present

## 2023-03-04 NOTE — Therapy (Signed)
OUTPATIENT OCCUPATIONAL THERAPY ORTHO TREATMENT NOTE  Patient Name: Brian Newton MRN: [id number] DOB:1966-07-24, 57 y.o., male Today's Date: 03/04/2023  PCP: Dr. Jenna Luo  REFERRING PROVIDER: Dr. 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)    Wrist pain    Past Surgical History:  Procedure Laterality Date   APPENDECTOMY  1986   ARTHOSCOPIC ROTAOR CUFF REPAIR Left 01/13/2023   Procedure: ARTHROSCOPIC ROTATOR CUFF REPAIR;  Surgeon: [name], MD;  Location: [facility];  Service: Orthopedics;  Laterality: Left;   BREATH TEK H PYLORI N/A 08/08/2014   Procedure: BREATH TEK H PYLORI;  Surgeon: Edward Jolly, MD;  Location: [facility];  Service: General;  Laterality: N/A;   COLONOSCOPY N/A 03/02/2013   Procedure: COLONOSCOPY;  Surgeon: Beryle Beams, MD;  Location: [facility];  Service: Endoscopy;  Laterality: N/A;   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   Procedure: LAPAROSCOPIC ROUX-EN-Y GASTRIC BYPASS WITH UPPER ENDOSCOPY;  Surgeon: Excell Seltzer, MD;  Location: [facility];  Service: General;  Laterality: N/A;   KNEE ARTHROSCOPY  2001   rt knee   OPEN SUBSCAPULARIS REPAIR  01/13/2023   Procedure: OPEN SUBSCAPULARIS REPAIR;  Surgeon: [name], MD;  Location: [facility];  Service: Orthopedics;;   SHOULDER ARTHROSCOPY WITH BICEPSTENOTOMY  01/13/2023   Procedure: SHOULDER ARTHROSCOPY WITH BICEPS TENOTOMY;  Surgeon: [name], MD;  Location: [facility];  Service: Orthopedics;;   UPPER GI ENDOSCOPY  11/05/2014   Procedure: UPPER GI ENDOSCOPY;  Surgeon: Excell Seltzer, MD;  Location: [facility];  Service: General;;   Patient Active Problem List   Diagnosis Date Noted   History of colonic polyps  11/27/2021   Morbid obesity ([place]) 04/01/2014   Hypercholesteremia    Hypertension    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  Rationale for Evaluation and Treatment: Rehabilitation  SUBJECTIVE:   SUBJECTIVE STATEMENT: S: "The sling has been pulling badly on my neck"  Pt accompanied by: self  PERTINENT HISTORY: Pt is a 57 y/o male s/p s/p left arthroscopic rotator cuff repair of massive superior cuff tear, with open repair of subscapularis on 01/13/23. Pt noted to have a massive tear and is on a conservative protocol.   PRECAUTIONS: Shoulder, see protocol for massive tear  WEIGHT BEARING RESTRICTIONS: Yes NWB  PAIN:  Are you having pain? No  FALLS: Has patient fallen in last 6 months? Yes. Number of falls 1  PLOF: Independent  PATIENT GOALS: To be able to use the LUE.   NEXT MD VISIT: 03/08/23  OBJECTIVE:   HAND DOMINANCE: Right  ADLs: Overall ADLs: Pt is having difficulty with sleeping,   FUNCTIONAL OUTCOME MEASURES: FOTO: 40/100  UPPER EXTREMITY ROM:     Assessed supine, er/IR adducted  Passive ROM Left eval  Shoulder flexion 128  Shoulder abduction 82  Shoulder internal rotation 90  Shoulder external rotation 52  (Blank rows = not tested)  Active ROM Left  eval  Shoulder flexion   Shoulder abduction   Shoulder internal rotation   Shoulder external rotation   (Blank rows = not tested)   UPPER EXTREMITY MMT:         MMT Left eval  Shoulder flexion   Shoulder abduction   Shoulder internal rotation   Shoulder external rotation   (Blank rows = not tested)   SENSATION: WFL  EDEMA: Edema observed at left upper arm and anterior shoulder  COGNITION: Overall cognitive status: Within functional limits for tasks  assessed  OBSERVATIONS: Mod fascial restrictions noted in left upper arm, anterior shoulder, trapezius, and scapular regions   TODAY'S TREATMENT:                                                                                                                              DATE:   03/04/23 - Myofascial release to deltoid, upper biceps, posterior traps, axillary region to decrease fascial restrictions, and increase joint ROM- noted increased restrictions to upper deltoid  - P/ROM: supine, shoulder flexion, abduction, horizontal abduction, er/IR, 10x each way  -AA/ROM: supine, flexion, abduction, protraction, horizontal abduction (limited), x10  03/02/23 - Myofascial release to deltoid, upper biceps, posterior traps, axillary region to decrease fascial restrictions, and increase joint ROM- noted increased restrictions to upper deltoid  - P/ROM: supine, shoulder flexion, abduction, 10x each way  - Thumbtacs: 2x45" - Shoulder rolls x10 - Table Slides: flexion, abduction, x10  02/25/2023 -  Myofascial release to deltoid, upper biceps, posterior traps, axillary region to decrease fascial restrictions, and increase joint ROM- noted increased restrictions to upper deltoid  - P/ROM: supine, shoulder flexion, abduction, 10x each way  -Scapular ROM: elevation/depression, protraction/retraction, x10 -Isometrics: flexion, abduction, internal rotation, 5x10-15" - Stretching: ball rolls, flexion 10x, abduction 10x     PATIENT EDUCATION: Education details: Reviewing HEP Person educated: Patient Education method: Explanation, Demonstration, and Handouts Education comprehension: verbalized understanding and returned demonstration  HOME EXERCISE PROGRAM: Eval: table slides 3/4: Isometrics - flexion, abduction, internal rotation only   GOALS: Goals reviewed with patient? Yes  SHORT TERM GOALS: Target date: 03/17/23  Pt will be provided with and educated on HEP to improve mobility in LUE required for  use during ADL completion.   Goal status: IN PROGRESS  2.  Pt will increase LUE P/ROM by 40 degrees or greater to improve ability to use LUE during dressing tasks with minimal compensatory techniques.   Goal status: IN PROGRESS  3.  Pt will increase LUE strength to 3+/5 to improve ability to reach for items at waist to chest height during bathing and grooming tasks.   Goal status: IN PROGRESS     LONG TERM GOALS: Target date: 04/18/23  Pt will decrease pain in LUE to 3/10 or less to improve ability to sleep for 2+ consecutive hours without waking due to pain.   Goal status: IN PROGRESS  2.  Pt will decrease LUE fascial restrictions to min amounts  or less to improve mobility required for functional reaching tasks.   Goal status: IN PROGRESS  3.  Pt will increase LUE A/ROM to at least 145 for flexion/abduction, and 50 degrees er/IR to improve ability to use LUE when reaching overhead or behind back during dressing and bathing tasks.   Goal status: IN PROGRESS  4.  Pt will increase LUE strength to 4+/5 or greater to improve ability to use LUE when lifting or carrying items during meal preparation/housework/yardwork tasks.   Goal status: IN PROGRESS  5.  Pt will return to highest level of function using LUE as non-dominant during functional task completion.   Goal status: IN PROGRESS  ASSESSMENT:  CLINICAL IMPRESSION: This session, pt continuing to work on passive ROM, manual therapy, and isometrics. His deltoid/bicep region was swollen and tight feeling this session, limiting him from reaching further than the original 128 degrees of flexion, however with abduction and external rotation his range is improving well passively. He completed 2 sets of thumb tacs this session with no complaints of fatigue. OT providing verbal and tactile cuing this session for positioning and technique.    PERFORMANCE DEFICITS: in functional skills including ADLs, IADLs, ROM, strength, pain, fascial  restrictions, and UE functional use   PLAN:  OT FREQUENCY: 2x/week  OT DURATION: 8 weeks  PLANNED INTERVENTIONS: self care/ADL training, therapeutic exercise, therapeutic activity, manual therapy, passive range of motion, splinting, electrical stimulation, ultrasound, moist heat, cryotherapy, patient/family education, and DME and/or AE instructions  RECOMMENDED OTHER SERVICES: None at this time  CONSULTED AND AGREED WITH PLAN OF CARE: Patient  PLAN FOR NEXT SESSION: Follow up on HEP, continue protocol Phase II-manual techniques, P/ROM, thumb tacs, basic isometrics, Slowly initiate Phase 3 of protocol, gentle AA/ROM   Paulita Fujita, OTR/L [phone] 03/04/2023, 8:34 AM

## 2023-03-07 ENCOUNTER — Ambulatory Visit (HOSPITAL_COMMUNITY): Payer: Federal, State, Local not specified - PPO | Admitting: Occupational Therapy

## 2023-03-07 ENCOUNTER — Encounter (HOSPITAL_COMMUNITY): Payer: Self-pay | Admitting: Occupational Therapy

## 2023-03-07 DIAGNOSIS — R29898 Other symptoms and signs involving the musculoskeletal system: Secondary | ICD-10-CM

## 2023-03-07 DIAGNOSIS — M25612 Stiffness of left shoulder, not elsewhere classified: Secondary | ICD-10-CM | POA: Diagnosis not present

## 2023-03-07 DIAGNOSIS — M25512 Pain in left shoulder: Secondary | ICD-10-CM

## 2023-03-07 NOTE — Therapy (Signed)
OUTPATIENT OCCUPATIONAL THERAPY ORTHO TREATMENT NOTE  Patient Name: Brian Newton MRN: [id number] DOB:1966-01-18, 57 y.o., male Today's Date: 03/07/2023  PCP: Dr. Jenna Luo  REFERRING PROVIDER: Dr. Larena Glassman  END OF SESSION:  OT End of Session - 03/07/23 1032     Visit Number 7    Number of Visits 16    Date for OT Re-Evaluation 04/18/23    Authorization Type BC[employer]    Authorization Time Period 61 visit limit    Authorization - Visit Number 6    Authorization - Number of Visits 50    Progress Note Due on Visit 10    OT Start Time 1035    OT Stop Time 1115    OT Time Calculation (min) 40 min    Activity Tolerance Patient tolerated treatment well    Behavior During Therapy WFL for tasks assessed/performed             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)    Wrist pain    Past Surgical History:  Procedure Laterality Date   APPENDECTOMY  1986   ARTHOSCOPIC ROTAOR CUFF REPAIR Left 01/13/2023   Procedure: ARTHROSCOPIC ROTATOR CUFF REPAIR;  Surgeon: [name], MD;  Location: [facility];  Service: Orthopedics;  Laterality: Left;   BREATH TEK H PYLORI N/A 08/08/2014   Procedure: BREATH TEK H PYLORI;  Surgeon: Edward Jolly, MD;  Location: [facility];  Service: General;  Laterality: N/A;   COLONOSCOPY N/A 03/02/2013   Procedure: COLONOSCOPY;  Surgeon: Beryle Beams, MD;  Location: [facility];  Service: Endoscopy;  Laterality: N/A;   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   Procedure: LAPAROSCOPIC ROUX-EN-Y GASTRIC BYPASS WITH UPPER ENDOSCOPY;  Surgeon: Excell Seltzer, MD;  Location: [facility];  Service: General;  Laterality: N/A;   KNEE ARTHROSCOPY  2001   rt knee   OPEN SUBSCAPULARIS REPAIR  01/13/2023   Procedure: OPEN  SUBSCAPULARIS REPAIR;  Surgeon: [name], MD;  Location: [facility];  Service: Orthopedics;;   SHOULDER ARTHROSCOPY WITH BICEPSTENOTOMY  01/13/2023   Procedure: SHOULDER ARTHROSCOPY WITH BICEPS TENOTOMY;  Surgeon: [name], MD;  Location: [facility];  Service: Orthopedics;;   UPPER GI ENDOSCOPY  11/05/2014   Procedure: UPPER GI ENDOSCOPY;  Surgeon: Excell Seltzer, MD;  Location: [facility];  Service: General;;   Patient Active Problem List   Diagnosis Date Noted   History of colonic polyps 11/27/2021   Morbid obesity ([place]) 04/01/2014   Hypercholesteremia    Hypertension    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 ([place])    Metabolic syndrome     ONSET DATE: 01/13/23  REFERRING DIAG: s/p left arthroscopic rotator cuff repair of massive superior cuff tear, with open repair of subscapularis   THERAPY DIAG:  Acute pain of left shoulder  Stiffness of left shoulder, not elsewhere classified  Other symptoms and signs involving the musculoskeletal system  Rationale for Evaluation and Treatment: Rehabilitation  SUBJECTIVE:   SUBJECTIVE STATEMENT: S: "I still can't sleep."  Pt accompanied by: self  PERTINENT HISTORY: Pt is a 57 y/o male s/p s/p left arthroscopic rotator cuff repair of massive superior cuff tear, with open repair of subscapularis on 01/13/23. Pt noted to  have a massive tear and is on a conservative protocol.   PRECAUTIONS: Shoulder, see protocol for massive tear  WEIGHT BEARING RESTRICTIONS: Yes NWB  PAIN:  Are you having pain? No  FALLS: Has patient fallen in last 6 months? Yes. Number of falls 1  PLOF: Independent  PATIENT GOALS: To be able to use the LUE.   NEXT MD VISIT: 03/08/23  OBJECTIVE:   HAND DOMINANCE: Right  ADLs: Overall ADLs: Pt is having difficulty with sleeping,   FUNCTIONAL OUTCOME MEASURES: FOTO: 40/100  UPPER EXTREMITY ROM:     Assessed supine, er/IR adducted  Passive ROM Left eval  Shoulder flexion 128  Shoulder  abduction 82  Shoulder internal rotation 90  Shoulder external rotation 52  (Blank rows = not tested)  Active ROM Left eval  Shoulder flexion   Shoulder abduction   Shoulder internal rotation   Shoulder external rotation   (Blank rows = not tested)   UPPER EXTREMITY MMT:         MMT Left eval  Shoulder flexion   Shoulder abduction   Shoulder internal rotation   Shoulder external rotation   (Blank rows = not tested)   SENSATION: WFL  EDEMA: Edema observed at left upper arm and anterior shoulder  COGNITION: Overall cognitive status: Within functional limits for tasks assessed  OBSERVATIONS: Mod fascial restrictions noted in left upper arm, anterior shoulder, trapezius, and scapular regions   TODAY'S TREATMENT:                                                                                                                              DATE:   03/07/23 - Myofascial release to deltoid, upper biceps, posterior traps, axillary region to decrease fascial restrictions, and increase joint ROM- noted increased restrictions to upper deltoid  - P/ROM: supine, shoulder flexion, abduction, horizontal abduction, er/IR, 10x each way  -AA/ROM: supine, flexion, abduction, protraction, horizontal abduction, er/IR, x10 -Wall Slides: flexion x10 -Pulleys: flexion x60  03/04/23 - Myofascial release to deltoid, upper biceps, posterior traps, axillary region to decrease fascial restrictions, and increase joint ROM- noted increased restrictions to upper deltoid  - P/ROM: supine, shoulder flexion, abduction, horizontal abduction, er/IR, 10x each way  -AA/ROM: supine, flexion, abduction, protraction, horizontal abduction (limited), x10  03/02/23 - Myofascial release to deltoid, upper biceps, posterior traps, axillary region to decrease fascial restrictions, and increase joint ROM- noted increased restrictions to upper deltoid  - P/ROM: supine, shoulder flexion, abduction, 10x each way  -  Thumbtacs: 2x45" - Shoulder rolls x10 - Table Slides: flexion, abduction, x10  02/25/2023 -  Myofascial release to deltoid, upper biceps, posterior traps, axillary region to decrease fascial restrictions, and increase joint ROM- noted increased restrictions to upper deltoid  - P/ROM: supine, shoulder flexion, abduction, 10x each way  -Scapular ROM: elevation/depression, protraction/retraction, x10 -Isometrics: flexion, abduction, internal rotation, 5x10-15" - Stretching: ball rolls, flexion 10x, abduction 10x     PATIENT EDUCATION: Education details: Reviewing  HEP Person educated: Patient Education method: Explanation, Demonstration, and Handouts Education comprehension: verbalized understanding and returned demonstration  HOME EXERCISE PROGRAM: Eval: table slides 3/4: Isometrics - flexion, abduction, internal rotation only   GOALS: Goals reviewed with patient? Yes  SHORT TERM GOALS: Target date: 03/17/23  Pt will be provided with and educated on HEP to improve mobility in LUE required for use during ADL completion.   Goal status: IN PROGRESS  2.  Pt will increase LUE P/ROM by 40 degrees or greater to improve ability to use LUE during dressing tasks with minimal compensatory techniques.   Goal status: IN PROGRESS  3.  Pt will increase LUE strength to 3+/5 to improve ability to reach for items at waist to chest height during bathing and grooming tasks.   Goal status: IN PROGRESS     LONG TERM GOALS: Target date: 04/18/23  Pt will decrease pain in LUE to 3/10 or less to improve ability to sleep for 2+ consecutive hours without waking due to pain.   Goal status: IN PROGRESS  2.  Pt will decrease LUE fascial restrictions to min amounts or less to improve mobility required for functional reaching tasks.   Goal status: IN PROGRESS  3.  Pt will increase LUE A/ROM to at least 145 for flexion/abduction, and 50 degrees er/IR to improve ability to use LUE when reaching overhead  or behind back during dressing and bathing tasks.   Goal status: IN PROGRESS  4.  Pt will increase LUE strength to 4+/5 or greater to improve ability to use LUE when lifting or carrying items during meal preparation/housework/yardwork tasks.   Goal status: IN PROGRESS  5.  Pt will return to highest level of function using LUE as non-dominant during functional task completion.   Goal status: IN PROGRESS  ASSESSMENT:  CLINICAL IMPRESSION: This session, pt began gentle AA/ROM. He reported mild pain during these exercises and required increased time due to stiffness. Overall he is demonstrating good movement patterns. His Passive range of motion this session was approximately 65% of full ROM and AA/ROM was 45% of full ROM. Pt slowly progressing to phase 3 of his protocol with gentle AA/ROM and increasing ROM tolerance. OT providing verbal and tactile cuing for positioning and technique.    PERFORMANCE DEFICITS: in functional skills including ADLs, IADLs, ROM, strength, pain, fascial restrictions, and UE functional use   PLAN:  OT FREQUENCY: 2x/week  OT DURATION: 8 weeks  PLANNED INTERVENTIONS: self care/ADL training, therapeutic exercise, therapeutic activity, manual therapy, passive range of motion, splinting, electrical stimulation, ultrasound, moist heat, cryotherapy, patient/family education, and DME and/or AE instructions  RECOMMENDED OTHER SERVICES: None at this time  CONSULTED AND AGREED WITH PLAN OF CARE: Patient  PLAN FOR NEXT SESSION: Follow up on HEP, continue protocol Phase II-manual techniques, P/ROM, thumb tacs, basic isometrics, Slowly initiate Phase 3 of protocol, gentle AA/ROM   Paulita Fujita, OTR/L [phone] 03/07/2023, 10:33 AM

## 2023-03-07 NOTE — Patient Instructions (Signed)

## 2023-03-08 ENCOUNTER — Encounter: Payer: Self-pay | Admitting: Orthopedic Surgery

## 2023-03-08 ENCOUNTER — Ambulatory Visit (INDEPENDENT_AMBULATORY_CARE_PROVIDER_SITE_OTHER): Payer: Federal, State, Local not specified - PPO | Admitting: Orthopedic Surgery

## 2023-03-08 DIAGNOSIS — S46012D Strain of muscle(s) and tendon(s) of the rotator cuff of left shoulder, subsequent encounter: Secondary | ICD-10-CM

## 2023-03-08 NOTE — Progress Notes (Signed)
Orthopaedic Postop Note  Assessment: Brian Newton is a 57 y.o. male s/p left arthroscopic rotator cuff repair of massive superior cuff tear, with open repair of subscapularis  DOS: 01/13/2023  Plan: Brian Newton is doing well.  He denies pain.  He is working well with physical therapy.  He has remained in his sling, but he can discontinue use of the sling now.  Continue with his physical therapy protocol.  Gradual increase in range of motion, as well as strengthening.  No concerns at this time.  Continue medications as needed.  I would like to see him back in approximately 6 weeks.  Follow-up: Return in about 6 weeks (around 04/19/2023). XR at next visit: None  Subjective:  Chief Complaint  Patient presents with   Post-op Follow-up    Left rotator cuff repair     History of Present Illness: Brian Newton is a 57 y.o. male who presents following the above stated procedure.  Surgery was approximately 8 weeks ago.  He has started physical therapy.  Gradual increase in his range of motion.  He denies pain.  He does note some soreness with activities.  He is still using a sling.  He is still taking aspirin.  Review of Systems: No fevers or chills No numbness or tingling No Chest Pain No shortness of breath   Objective: There were no vitals taken for this visit.  Physical Exam:  Surgical incisions have healed.  No surrounding erythema or drainage.  Sensation intact in the axillary nerve distribution.  Sensation intact throughout the left hand.  Fingers are warm well-perfused.  He tolerates forward flexion to 120 degrees.  Abduction to 90 degrees.  30 degrees of external rotation.  These are passive measurements.   IMAGING: I personally ordered and reviewed the following images:  No new imaging obtained today.   [name], MD 03/08/2023 1:33 PM

## 2023-03-10 ENCOUNTER — Ambulatory Visit (HOSPITAL_COMMUNITY): Payer: Federal, State, Local not specified - PPO | Admitting: Occupational Therapy

## 2023-03-10 ENCOUNTER — Encounter (HOSPITAL_COMMUNITY): Payer: Self-pay | Admitting: Occupational Therapy

## 2023-03-10 DIAGNOSIS — R29898 Other symptoms and signs involving the musculoskeletal system: Secondary | ICD-10-CM

## 2023-03-10 DIAGNOSIS — M25612 Stiffness of left shoulder, not elsewhere classified: Secondary | ICD-10-CM

## 2023-03-10 DIAGNOSIS — M25512 Pain in left shoulder: Secondary | ICD-10-CM | POA: Diagnosis not present

## 2023-03-10 NOTE — Therapy (Signed)
OUTPATIENT OCCUPATIONAL THERAPY ORTHO TREATMENT NOTE  Patient Name: Brian Newton MRN: [id number] DOB:06/27/1966, 57 y.o., male Today's Date: 03/10/2023  PCP: Dr. Jenna Luo  REFERRING PROVIDER: Dr. Larena Glassman  END OF SESSION:  OT End of Session - 03/10/23 0956     Visit Number 8    Number of Visits 16    Date for OT Re-Evaluation 04/18/23    Authorization Type [employer]    Authorization Time Period 37 visit limit    Authorization - Visit Number 7    Authorization - Number of Visits 50    Progress Note Due on Visit 10    OT Start Time 0950    OT Stop Time 1030    OT Time Calculation (min) 40 min    Activity Tolerance Patient tolerated treatment well    Behavior During Therapy WFL for tasks assessed/performed             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)    Wrist pain    Past Surgical History:  Procedure Laterality Date   APPENDECTOMY  1986   ARTHOSCOPIC ROTAOR CUFF REPAIR Left 01/13/2023   Procedure: ARTHROSCOPIC ROTATOR CUFF REPAIR;  Surgeon: [name], MD;  Location: [facility];  Service: Orthopedics;  Laterality: Left;   BREATH TEK H PYLORI N/A 08/08/2014   Procedure: BREATH TEK H PYLORI;  Surgeon: Edward Jolly, MD;  Location: [facility];  Service: General;  Laterality: N/A;   COLONOSCOPY N/A 03/02/2013   Procedure: COLONOSCOPY;  Surgeon: Beryle Beams, MD;  Location: [facility];  Service: Endoscopy;  Laterality: N/A;   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   Procedure: LAPAROSCOPIC ROUX-EN-Y GASTRIC BYPASS WITH UPPER ENDOSCOPY;  Surgeon: Excell Seltzer, MD;  Location: [facility];  Service: General;  Laterality: N/A;   KNEE ARTHROSCOPY  2001   rt knee   OPEN SUBSCAPULARIS REPAIR  01/13/2023   Procedure: OPEN  SUBSCAPULARIS REPAIR;  Surgeon: [name], MD;  Location: [facility];  Service: Orthopedics;;   SHOULDER ARTHROSCOPY WITH BICEPSTENOTOMY  01/13/2023   Procedure: SHOULDER ARTHROSCOPY WITH BICEPS TENOTOMY;  Surgeon: [name], MD;  Location: [facility];  Service: Orthopedics;;   UPPER GI ENDOSCOPY  11/05/2014   Procedure: UPPER GI ENDOSCOPY;  Surgeon: Excell Seltzer, MD;  Location: [facility];  Service: General;;   Patient Active Problem List   Diagnosis Date Noted   History of colonic polyps 11/27/2021   Morbid obesity ([place]) 04/01/2014   Hypercholesteremia    Hypertension    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 ([place])    Metabolic syndrome     ONSET DATE: 01/13/23  REFERRING DIAG: s/p left arthroscopic rotator cuff repair of massive superior cuff tear, with open repair of subscapularis   THERAPY DIAG:  Acute pain of left shoulder  Stiffness of left shoulder, not elsewhere classified  Other symptoms and signs involving the musculoskeletal system  Rationale for Evaluation and Treatment: Rehabilitation  SUBJECTIVE:   SUBJECTIVE STATEMENT: S: "I still can't sleep."  Pt accompanied by: self  PERTINENT HISTORY: Pt is a 57 y/o male s/p s/p left arthroscopic rotator cuff repair of massive superior cuff tear, with open repair of subscapularis on 01/13/23. Pt noted to  have a massive tear and is on a conservative protocol.   PRECAUTIONS: Shoulder, see protocol for massive tear  WEIGHT BEARING RESTRICTIONS: Yes NWB  PAIN:  Are you having pain? No  FALLS: Has patient fallen in last 6 months? Yes. Number of falls 1  PLOF: Independent  PATIENT GOALS: To be able to use the LUE.   NEXT MD VISIT: 03/08/23  OBJECTIVE:   HAND DOMINANCE: Right  ADLs: Overall ADLs: Pt is having difficulty with sleeping,   FUNCTIONAL OUTCOME MEASURES: FOTO: 40/100  UPPER EXTREMITY ROM:     Assessed supine, er/IR adducted  Passive ROM Left eval  Shoulder flexion 128  Shoulder  abduction 82  Shoulder internal rotation 90  Shoulder external rotation 52  (Blank rows = not tested)  Active ROM Left eval  Shoulder flexion   Shoulder abduction   Shoulder internal rotation   Shoulder external rotation   (Blank rows = not tested)   UPPER EXTREMITY MMT:         MMT Left eval  Shoulder flexion   Shoulder abduction   Shoulder internal rotation   Shoulder external rotation   (Blank rows = not tested)   SENSATION: WFL  EDEMA: Edema observed at left upper arm and anterior shoulder  COGNITION: Overall cognitive status: Within functional limits for tasks assessed  OBSERVATIONS: Mod fascial restrictions noted in left upper arm, anterior shoulder, trapezius, and scapular regions   TODAY'S TREATMENT:                                                                                                                              DATE:   03/10/23 - Myofascial release to deltoid, upper biceps, posterior traps, axillary region to decrease fascial restrictions, and increase joint ROM- noted increased restrictions to upper deltoid  - AA/ROM: supine, flexion, abduction, protraction, horizontal abduction, er/IR, x10 - Wall Slides: flexion, abduction, x10 - Scapular Strengthening: red theraband, extension, retraction, rows, x10 -Pulleys: flexion, abduction, x60"  03/07/23 - Myofascial release to deltoid, upper biceps, posterior traps, axillary region to decrease fascial restrictions, and increase joint ROM- noted increased restrictions to upper deltoid  - P/ROM: supine, shoulder flexion, abduction, horizontal abduction, er/IR, 10x each way  -AA/ROM: supine, flexion, abduction, protraction, horizontal abduction, er/IR, x10 -Wall Slides: flexion x10 -Pulleys: flexion x60  03/04/23 - Myofascial release to deltoid, upper biceps, posterior traps, axillary region to decrease fascial restrictions, and increase joint ROM- noted increased restrictions to upper deltoid  - P/ROM:  supine, shoulder flexion, abduction, horizontal abduction, er/IR, 10x each way  -AA/ROM: supine, flexion, abduction, protraction, horizontal abduction (limited), x10    PATIENT EDUCATION: Education details: English as a second language teacher and wall slides Person educated: Patient Education method: Consulting civil engineer, Demonstration, and Handouts Education comprehension: verbalized understanding and returned demonstration  HOME EXERCISE PROGRAM: Eval: table slides 3/4: Isometrics - flexion, abduction, internal rotation only 3/18: AA/ROM 3/21: Scapular Strengthening and wall slides  GOALS: Goals reviewed with patient? Yes  SHORT TERM  GOALS: Target date: 03/17/23  Pt will be provided with and educated on HEP to improve mobility in LUE required for use during ADL completion.   Goal status: IN PROGRESS  2.  Pt will increase LUE P/ROM by 40 degrees or greater to improve ability to use LUE during dressing tasks with minimal compensatory techniques.   Goal status: IN PROGRESS  3.  Pt will increase LUE strength to 3+/5 to improve ability to reach for items at waist to chest height during bathing and grooming tasks.   Goal status: IN PROGRESS     LONG TERM GOALS: Target date: 04/18/23  Pt will decrease pain in LUE to 3/10 or less to improve ability to sleep for 2+ consecutive hours without waking due to pain.   Goal status: IN PROGRESS  2.  Pt will decrease LUE fascial restrictions to min amounts or less to improve mobility required for functional reaching tasks.   Goal status: IN PROGRESS  3.  Pt will increase LUE A/ROM to at least 145 for flexion/abduction, and 50 degrees er/IR to improve ability to use LUE when reaching overhead or behind back during dressing and bathing tasks.   Goal status: IN PROGRESS  4.  Pt will increase LUE strength to 4+/5 or greater to improve ability to use LUE when lifting or carrying items during meal preparation/housework/yardwork tasks.   Goal status: IN  PROGRESS  5.  Pt will return to highest level of function using LUE as non-dominant during functional task completion.   Goal status: IN PROGRESS  ASSESSMENT:  CLINICAL IMPRESSION: Pt presenting with improving ROM with active assisted ROM. He is achieving approximately 70% of full ROM with all active assisted exercises, reporting only mild pain with abduction. He stated that he isn't really having much pain and feels like his muscles are starting to loosen up and allow him to move better. OT added scapular strengthening this session with a red theraband, which pt tolerated well, only reporting mild fatigue. Verbal and tactile cuing provided for positioning and technique this session.    PERFORMANCE DEFICITS: in functional skills including ADLs, IADLs, ROM, strength, pain, fascial restrictions, and UE functional use   PLAN:  OT FREQUENCY: 2x/week  OT DURATION: 8 weeks  PLANNED INTERVENTIONS: self care/ADL training, therapeutic exercise, therapeutic activity, manual therapy, passive range of motion, splinting, electrical stimulation, ultrasound, moist heat, cryotherapy, patient/family education, and DME and/or AE instructions  RECOMMENDED OTHER SERVICES: None at this time  CONSULTED AND AGREED WITH PLAN OF CARE: Patient  PLAN FOR NEXT SESSION: Follow up on HEP, continue protocol Phase II-manual techniques, P/ROM, thumb tacs, basic isometrics, Slowly initiate Phase 3 of protocol, gentle AA/ROM, scapular strengthening   Paulita Fujita, OTR/L [phone] 03/10/2023, 9:57 AM

## 2023-03-10 NOTE — Patient Instructions (Signed)

## 2023-03-15 ENCOUNTER — Encounter (HOSPITAL_COMMUNITY): Payer: Self-pay | Admitting: Occupational Therapy

## 2023-03-15 ENCOUNTER — Ambulatory Visit (HOSPITAL_COMMUNITY): Payer: Federal, State, Local not specified - PPO | Admitting: Occupational Therapy

## 2023-03-15 DIAGNOSIS — R29898 Other symptoms and signs involving the musculoskeletal system: Secondary | ICD-10-CM

## 2023-03-15 DIAGNOSIS — M25512 Pain in left shoulder: Secondary | ICD-10-CM

## 2023-03-15 DIAGNOSIS — M25612 Stiffness of left shoulder, not elsewhere classified: Secondary | ICD-10-CM

## 2023-03-15 NOTE — Therapy (Signed)
OUTPATIENT OCCUPATIONAL THERAPY ORTHO TREATMENT NOTE Mini-Reassessment  Patient Name: Brian Newton MRN: [id number] DOB:February 15, 1966, 57 y.o., male Today's Date: 03/15/2023  PCP: Dr. Jenna Luo  REFERRING PROVIDER: Dr. Larena Glassman  END OF SESSION:  OT End of Session - 03/15/23 1107     Visit Number 9    Number of Visits 16    Date for OT Re-Evaluation 04/18/23    Authorization Type [employer]    Authorization Time Period 42 visit limit    Authorization - Visit Number 8    Authorization - Number of Visits 50    Progress Note Due on Visit 10    OT Start Time 1025    OT Stop Time 1106    OT Time Calculation (min) 41 min    Activity Tolerance Patient tolerated treatment well    Behavior During Therapy WFL for tasks assessed/performed              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)    Wrist pain    Past Surgical History:  Procedure Laterality Date   APPENDECTOMY  1986   ARTHOSCOPIC ROTAOR CUFF REPAIR Left 01/13/2023   Procedure: ARTHROSCOPIC ROTATOR CUFF REPAIR;  Surgeon: [name], MD;  Location: [facility];  Service: Orthopedics;  Laterality: Left;   BREATH TEK H PYLORI N/A 08/08/2014   Procedure: BREATH TEK H PYLORI;  Surgeon: Edward Jolly, MD;  Location: [facility];  Service: General;  Laterality: N/A;   COLONOSCOPY N/A 03/02/2013   Procedure: COLONOSCOPY;  Surgeon: Beryle Beams, MD;  Location: [facility];  Service: Endoscopy;  Laterality: N/A;   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   Procedure: LAPAROSCOPIC ROUX-EN-Y GASTRIC BYPASS WITH UPPER ENDOSCOPY;  Surgeon: Excell Seltzer, MD;  Location: [facility];  Service: General;  Laterality: N/A;   KNEE ARTHROSCOPY  2001   rt knee   OPEN SUBSCAPULARIS REPAIR   01/13/2023   Procedure: OPEN SUBSCAPULARIS REPAIR;  Surgeon: [name], MD;  Location: [facility];  Service: Orthopedics;;   SHOULDER ARTHROSCOPY WITH BICEPSTENOTOMY  01/13/2023   Procedure: SHOULDER ARTHROSCOPY WITH BICEPS TENOTOMY;  Surgeon: [name], MD;  Location: [facility];  Service: Orthopedics;;   UPPER GI ENDOSCOPY  11/05/2014   Procedure: UPPER GI ENDOSCOPY;  Surgeon: Excell Seltzer, MD;  Location: [facility];  Service: General;;   Patient Active Problem List   Diagnosis Date Noted   History of colonic polyps 11/27/2021   Morbid obesity ([place]) 04/01/2014   Hypercholesteremia    Hypertension    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 ([place])    Metabolic syndrome     ONSET DATE: 01/13/23  REFERRING DIAG: s/p left arthroscopic rotator cuff repair of massive superior cuff tear, with open repair of subscapularis   THERAPY DIAG:  Acute pain of left shoulder  Stiffness of left shoulder, not elsewhere classified  Other symptoms and signs involving the musculoskeletal system  Rationale for Evaluation and Treatment: Rehabilitation  SUBJECTIVE:   SUBJECTIVE STATEMENT: S: "I think it's doing pretty good."   PERTINENT HISTORY: Pt is a 57 y/o male s/p s/p left arthroscopic rotator cuff repair of massive superior cuff tear, with open repair of subscapularis on 01/13/23. Pt noted to  have a massive tear and is on a conservative protocol.   PRECAUTIONS: Shoulder, see protocol for massive tear  WEIGHT BEARING RESTRICTIONS: Yes NWB  PAIN:  Are you having pain? No  FALLS: Has patient fallen in last 6 months? Yes. Number of falls 1  PLOF: Independent  PATIENT GOALS: To be able to use the LUE.   NEXT MD VISIT: 04/19/23  OBJECTIVE:   HAND DOMINANCE: Right  ADLs: Overall ADLs: Pt is having difficulty with sleeping,   FUNCTIONAL OUTCOME MEASURES: FOTO: 40/100  UPPER EXTREMITY ROM:     Assessed supine, er/IR adducted  Passive ROM Left eval Left 03/14/23  Shoulder  flexion 128 145  Shoulder abduction 82 156  Shoulder internal rotation 90 90  Shoulder external rotation 52 63  (Blank rows = not tested)  Assessed seated, er/IR adducted  Active ROM Left eval Left 03/14/23  Shoulder flexion  103  Shoulder abduction  76  Shoulder internal rotation  90  Shoulder external rotation  38  (Blank rows = not tested)   UPPER EXTREMITY MMT:       Assessed seated, er/IR adducted  MMT Left eval Left 03/14/23  Shoulder flexion  3-/5  Shoulder abduction  3-/5  Shoulder internal rotation  3/5  Shoulder external rotation  3/5  (Blank rows = not tested)   EDEMA: Edema observed at left upper arm and anterior shoulder   OBSERVATIONS: Mod fascial restrictions noted in left upper arm, anterior shoulder, trapezius, and scapular regions   TODAY'S TREATMENT:                                                                                                                              DATE:  03/14/23 -Myofascial release to deltoid, upper biceps, posterior traps, axillary region to decrease fascial restrictions, and increase joint ROM- noted increased restrictions to upper deltoid  -AA/ROM: seated-flexion, abduction, protraction, horizontal abduction, er/IR, x12 -AA/ROM: seated-flexion, abduction, protraction, horizontal abduction, er/IR, x12 -Wall wash: 1' flexion -PVC Pipe slide: 10X flexion -Scapular Strengthening: red theraband, extension, rows, x10  03/10/23 - Myofascial release to deltoid, upper biceps, posterior traps, axillary region to decrease fascial restrictions, and increase joint ROM- noted increased restrictions to upper deltoid  - AA/ROM: supine, flexion, abduction, protraction, horizontal abduction, er/IR, x10 - Wall Slides: flexion, abduction, x10 - Scapular Strengthening: red theraband, extension, retraction, rows, x10 -Pulleys: flexion, abduction, x60"  03/07/23 - Myofascial release to deltoid, upper biceps, posterior traps, axillary region  to decrease fascial restrictions, and increase joint ROM- noted increased restrictions to upper deltoid  - P/ROM: supine, shoulder flexion, abduction, horizontal abduction, er/IR, 10x each way  -AA/ROM: supine, flexion, abduction, protraction, horizontal abduction, er/IR, x10 -Wall Slides: flexion x10 -Pulleys: flexion x60   PATIENT EDUCATION: Education details: discussed completing AA/ROM in both supine and standing Person educated: Patient Education method: Explanation, Demonstration, and Handouts Education comprehension: verbalized understanding and returned demonstration  HOME EXERCISE PROGRAM: Eval: table slides 3/4: Isometrics -  flexion, abduction, internal rotation only 3/18: AA/ROM 3/21: Scapular Strengthening and wall slides  GOALS: Goals reviewed with patient? Yes  SHORT TERM GOALS: Target date: 03/17/23  Pt will be provided with and educated on HEP to improve mobility in LUE required for use during ADL completion.   Goal status: MET  2.  Pt will increase LUE P/ROM by 40 degrees or greater to improve ability to use LUE during dressing tasks with minimal compensatory techniques.   Goal status: IN PROGRESS  3.  Pt will increase LUE strength to 3+/5 to improve ability to reach for items at waist to chest height during bathing and grooming tasks.   Goal status: IN PROGRESS     LONG TERM GOALS: Target date: 04/18/23  Pt will decrease pain in LUE to 3/10 or less to improve ability to sleep for 2+ consecutive hours without waking due to pain.   Goal status: IN PROGRESS  2.  Pt will decrease LUE fascial restrictions to min amounts or less to improve mobility required for functional reaching tasks.   Goal status: IN PROGRESS  3.  Pt will increase LUE A/ROM to at least 145 for flexion/abduction, and 50 degrees er/IR to improve ability to use LUE when reaching overhead or behind back during dressing and bathing tasks.   Goal status: IN PROGRESS  4.  Pt will increase  LUE strength to 4+/5 or greater to improve ability to use LUE when lifting or carrying items during meal preparation/housework/yardwork tasks.   Goal status: IN PROGRESS  5.  Pt will return to highest level of function using LUE as non-dominant during functional task completion.   Goal status: IN PROGRESS  ASSESSMENT:  CLINICAL IMPRESSION: Pt reports he is completing his HEP 5x every day, has improved sleep since discontinuing the sling. Mini-reassessment completed this session, pt demonstrating improvements in ROM and strength, as well as pain. A/ROM measured for the first time, MMT measured via observation. Continued with manual techniques today, pt with good ROM during passive stretching. Completed AA/ROM in supine and sitting, increasing reps to 12. Pt noted to have trapezius hiking during seated tasks. Educated on countering as able. Added pvc pipe slide and wall wash today, continued with scapular theraband. Verbal cuing for form and technique.    PERFORMANCE DEFICITS: in functional skills including ADLs, IADLs, ROM, strength, pain, fascial restrictions, and UE functional use   PLAN:  OT FREQUENCY: 2x/week  OT DURATION: 8 weeks  PLANNED INTERVENTIONS: self care/ADL training, therapeutic exercise, therapeutic activity, manual therapy, passive range of motion, splinting, electrical stimulation, ultrasound, moist heat, cryotherapy, patient/family education, and DME and/or AE instructions  CONSULTED AND AGREED WITH PLAN OF CARE: Patient  PLAN FOR NEXT SESSION: ontinue protocol Phase III-manual techniques, P/ROM, AA/ROM progressing to A/ROM, scapular strengthening   Guadelupe Sabin, OTR/L  [phone] 03/15/2023, 11:07 AM

## 2023-03-18 ENCOUNTER — Ambulatory Visit (HOSPITAL_COMMUNITY): Payer: Federal, State, Local not specified - PPO | Admitting: Occupational Therapy

## 2023-03-18 ENCOUNTER — Encounter (HOSPITAL_COMMUNITY): Payer: Self-pay | Admitting: Occupational Therapy

## 2023-03-18 DIAGNOSIS — R29898 Other symptoms and signs involving the musculoskeletal system: Secondary | ICD-10-CM | POA: Diagnosis not present

## 2023-03-18 DIAGNOSIS — M25612 Stiffness of left shoulder, not elsewhere classified: Secondary | ICD-10-CM | POA: Diagnosis not present

## 2023-03-18 DIAGNOSIS — M25512 Pain in left shoulder: Secondary | ICD-10-CM

## 2023-03-18 NOTE — Patient Instructions (Signed)

## 2023-03-18 NOTE — Therapy (Signed)
OUTPATIENT OCCUPATIONAL THERAPY ORTHO TREATMENT NOTE Mini-Reassessment  Patient Name: Brian Newton MRN: [id number] DOB:26-May-1966, 57 y.o., male Today's Date: 03/18/2023  PCP: Dr. Jenna Luo  REFERRING PROVIDER: Dr. Larena Glassman  END OF SESSION:  OT End of Session - 03/18/23 0740     Visit Number 10    Number of Visits 16    Date for OT Re-Evaluation 04/18/23    Authorization Type [employer]    Authorization Time Period 25 visit limit    Authorization - Visit Number 9    Authorization - Number of Visits 50    Progress Note Due on Visit 10    OT Start Time 0737    OT Stop Time 0817    OT Time Calculation (min) 40 min    Activity Tolerance Patient tolerated treatment well    Behavior During Therapy WFL for tasks assessed/performed              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)    Wrist pain    Past Surgical History:  Procedure Laterality Date   APPENDECTOMY  1986   ARTHOSCOPIC ROTAOR CUFF REPAIR Left 01/13/2023   Procedure: ARTHROSCOPIC ROTATOR CUFF REPAIR;  Surgeon: [name], MD;  Location: [facility];  Service: Orthopedics;  Laterality: Left;   BREATH TEK H PYLORI N/A 08/08/2014   Procedure: BREATH TEK H PYLORI;  Surgeon: Edward Jolly, MD;  Location: [facility];  Service: General;  Laterality: N/A;   COLONOSCOPY N/A 03/02/2013   Procedure: COLONOSCOPY;  Surgeon: Beryle Beams, MD;  Location: [facility];  Service: Endoscopy;  Laterality: N/A;   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   Procedure: LAPAROSCOPIC ROUX-EN-Y GASTRIC BYPASS WITH UPPER ENDOSCOPY;  Surgeon: Excell Seltzer, MD;  Location: [facility];  Service: General;  Laterality: N/A;   KNEE ARTHROSCOPY  2001   rt knee   OPEN SUBSCAPULARIS REPAIR   01/13/2023   Procedure: OPEN SUBSCAPULARIS REPAIR;  Surgeon: [name], MD;  Location: [facility];  Service: Orthopedics;;   SHOULDER ARTHROSCOPY WITH BICEPSTENOTOMY  01/13/2023   Procedure: SHOULDER ARTHROSCOPY WITH BICEPS TENOTOMY;  Surgeon: [name], MD;  Location: [facility];  Service: Orthopedics;;   UPPER GI ENDOSCOPY  11/05/2014   Procedure: UPPER GI ENDOSCOPY;  Surgeon: Excell Seltzer, MD;  Location: [facility];  Service: General;;   Patient Active Problem List   Diagnosis Date Noted   History of colonic polyps 11/27/2021   Morbid obesity ([place]) 04/01/2014   Hypercholesteremia    Hypertension    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 ([place])    Metabolic syndrome     ONSET DATE: 01/13/23  REFERRING DIAG: s/p left arthroscopic rotator cuff repair of massive superior cuff tear, with open repair of subscapularis   THERAPY DIAG:  Acute pain of left shoulder  Stiffness of left shoulder, not elsewhere classified  Other symptoms and signs involving the musculoskeletal system  Rationale for Evaluation and Treatment: Rehabilitation  SUBJECTIVE:   SUBJECTIVE STATEMENT: S: "My forearm has really been bothering me"   PERTINENT HISTORY: Pt is a 57 y/o male s/p s/p left arthroscopic rotator cuff repair of massive superior cuff tear, with open repair of subscapularis on 01/13/23. Pt noted  to have a massive tear and is on a conservative protocol.   PRECAUTIONS: Shoulder, see protocol for massive tear  WEIGHT BEARING RESTRICTIONS: Yes NWB  PAIN:  Are you having pain? No  FALLS: Has patient fallen in last 6 months? Yes. Number of falls 1  PLOF: Independent  PATIENT GOALS: To be able to use the LUE.   NEXT MD VISIT: 04/19/23  OBJECTIVE:   HAND DOMINANCE: Right  ADLs: Overall ADLs: Pt is having difficulty with sleeping,   FUNCTIONAL OUTCOME MEASURES: FOTO: 40/100  UPPER EXTREMITY ROM:     Assessed supine, er/IR adducted  Passive ROM Left eval Left 03/14/23   Shoulder flexion 128 145  Shoulder abduction 82 156  Shoulder internal rotation 90 90  Shoulder external rotation 52 63  (Blank rows = not tested)  Assessed seated, er/IR adducted  Active ROM Left eval Left 03/14/23  Shoulder flexion  103  Shoulder abduction  76  Shoulder internal rotation  90  Shoulder external rotation  38  (Blank rows = not tested)   UPPER EXTREMITY MMT:       Assessed seated, er/IR adducted  MMT Left eval Left 03/14/23  Shoulder flexion  3-/5  Shoulder abduction  3-/5  Shoulder internal rotation  3/5  Shoulder external rotation  3/5  (Blank rows = not tested)   EDEMA: Edema observed at left upper arm and anterior shoulder   OBSERVATIONS: Mod fascial restrictions noted in left upper arm, anterior shoulder, trapezius, and scapular regions   TODAY'S TREATMENT:                                                                                                                              DATE:  03/18/23 -Myofascial release to deltoid, upper biceps, posterior traps, axillary region to decrease fascial restrictions, and increase joint ROM- noted increased restrictions to upper deltoid  -AA/ROM: supine-flexion, abduction, protraction, horizontal abduction, er/IR, x12 -A/ROM: supine- flexion, abduction, protraction, horizontal abduction, er/IR, x10 -AA/ROM: seated-flexion, abduction, protraction, horizontal abduction, er/IR, x10 -Proximal shoulder: paddles, criss cross, circles both directions, x10 -UBE: level 1, 2.5' forwards and backwards, 3.0+KPH  03/14/23 -Myofascial release to deltoid, upper biceps, posterior traps, axillary region to decrease fascial restrictions, and increase joint ROM- noted increased restrictions to upper deltoid  -AA/ROM: seated-flexion, abduction, protraction, horizontal abduction, er/IR, x12 -AA/ROM: seated-flexion, abduction, protraction, horizontal abduction, er/IR, x12 -Wall wash: 1' flexion -PVC Pipe slide: 10X  flexion -Scapular Strengthening: red theraband, extension, rows, x10  03/10/23 - Myofascial release to deltoid, upper biceps, posterior traps, axillary region to decrease fascial restrictions, and increase joint ROM- noted increased restrictions to upper deltoid  - AA/ROM: supine, flexion, abduction, protraction, horizontal abduction, er/IR, x10 - Wall Slides: flexion, abduction, x10 - Scapular Strengthening: red theraband, extension, retraction, rows, x10 -Pulleys: flexion, abduction, x60"   PATIENT EDUCATION: Education details: A/ROM Person educated: Patient Education method: Consulting civil engineer, Media planner, and Handouts Education comprehension: verbalized understanding and returned demonstration  HOME EXERCISE PROGRAM:  Eval: table slides 3/4: Isometrics - flexion, abduction, internal rotation only 3/18: AA/ROM 3/21: Scapular Strengthening and wall slides 3/29: A/ROM  GOALS: Goals reviewed with patient? Yes  SHORT TERM GOALS: Target date: 03/17/23  Pt will be provided with and educated on HEP to improve mobility in LUE required for use during ADL completion.   Goal status: MET  2.  Pt will increase LUE P/ROM by 40 degrees or greater to improve ability to use LUE during dressing tasks with minimal compensatory techniques.   Goal status: IN PROGRESS  3.  Pt will increase LUE strength to 3+/5 to improve ability to reach for items at waist to chest height during bathing and grooming tasks.   Goal status: IN PROGRESS     LONG TERM GOALS: Target date: 04/18/23  Pt will decrease pain in LUE to 3/10 or less to improve ability to sleep for 2+ consecutive hours without waking due to pain.   Goal status: IN PROGRESS  2.  Pt will decrease LUE fascial restrictions to min amounts or less to improve mobility required for functional reaching tasks.   Goal status: IN PROGRESS  3.  Pt will increase LUE A/ROM to at least 145 for flexion/abduction, and 50 degrees er/IR to improve ability  to use LUE when reaching overhead or behind back during dressing and bathing tasks.   Goal status: IN PROGRESS  4.  Pt will increase LUE strength to 4+/5 or greater to improve ability to use LUE when lifting or carrying items during meal preparation/housework/yardwork tasks.   Goal status: IN PROGRESS  5.  Pt will return to highest level of function using LUE as non-dominant during functional task completion.   Goal status: IN PROGRESS  ASSESSMENT:  CLINICAL IMPRESSION: Pt continuing to make good progress with ROM and strength. This session he started A/ROM where he required min assist for abduction past 90 degrees. He is able to complete AA/ROM in sitting/standing with minimal discomfort and strain. Pt demonstrates good movement pattern with minimal cuing for body mechanics. OT providing verbal cuing for positioning and to limit compensatory strategies such as shoulder hiking throughout the session.    PERFORMANCE DEFICITS: in functional skills including ADLs, IADLs, ROM, strength, pain, fascial restrictions, and UE functional use   PLAN:  OT FREQUENCY: 2x/week  OT DURATION: 8 weeks  PLANNED INTERVENTIONS: self care/ADL training, therapeutic exercise, therapeutic activity, manual therapy, passive range of motion, splinting, electrical stimulation, ultrasound, moist heat, cryotherapy, patient/family education, and DME and/or AE instructions  CONSULTED AND AGREED WITH PLAN OF CARE: Patient  PLAN FOR NEXT SESSION: ontinue protocol Phase III-manual techniques, P/ROM, AA/ROM progressing to A/ROM, scapular strengthening, Proximal exercises   Paulita Fujita, OTR/L [phone] 03/18/2023, 7:41 AM

## 2023-03-21 ENCOUNTER — Ambulatory Visit (HOSPITAL_COMMUNITY): Payer: Federal, State, Local not specified - PPO | Attending: Orthopedic Surgery | Admitting: Occupational Therapy

## 2023-03-21 ENCOUNTER — Encounter (HOSPITAL_COMMUNITY): Payer: Self-pay | Admitting: Occupational Therapy

## 2023-03-21 DIAGNOSIS — M25612 Stiffness of left shoulder, not elsewhere classified: Secondary | ICD-10-CM | POA: Insufficient documentation

## 2023-03-21 DIAGNOSIS — R29898 Other symptoms and signs involving the musculoskeletal system: Secondary | ICD-10-CM | POA: Insufficient documentation

## 2023-03-21 DIAGNOSIS — M25512 Pain in left shoulder: Secondary | ICD-10-CM | POA: Diagnosis not present

## 2023-03-21 NOTE — Therapy (Signed)
OUTPATIENT OCCUPATIONAL THERAPY ORTHO TREATMENT NOTE  Patient Name: Brian Newton MRN: [id number] DOB:[birth year], 58 y.o., male Today's Date: 03/21/2023  PCP: Dr. Jenna Luo  REFERRING PROVIDER: Dr. Larena Glassman  END OF SESSION:  OT End of Session - 03/21/23 1058     Visit Number 11    Number of Visits 16    Date for OT Re-Evaluation 04/18/23    Authorization Type [employer]    Authorization Time Period 37 visit limit    Authorization - Visit Number 10    Authorization - Number of Visits 50    Progress Note Due on Visit 10    OT Start Time 1021    OT Stop Time 1101    OT Time Calculation (min) 40 min    Activity Tolerance Patient tolerated treatment well    Behavior During Therapy WFL for tasks assessed/performed               Past Medical History:  Diagnosis Date   Arthritis    Borderline hyperglycemia    Headache(784.0)    Hypercholesteremia    Hypertension    Metabolic syndrome    Shingles    Sleep apnea    uses C PAP   Swelling    both legs   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    Wrist pain    Past Surgical History:  Procedure Laterality Date   APPENDECTOMY  1986   ARTHOSCOPIC ROTAOR CUFF REPAIR Left 01/13/2023   Procedure: ARTHROSCOPIC ROTATOR CUFF REPAIR;  Surgeon: [name], MD;  Location: [facility];  Service: Orthopedics;  Laterality: Left;   BREATH TEK H PYLORI N/A 08/08/2014   Procedure: BREATH TEK H PYLORI;  Surgeon: Edward Jolly, MD;  Location: [facility];  Service: General;  Laterality: N/A;   COLONOSCOPY N/A 03/02/2013   Procedure: COLONOSCOPY;  Surgeon: Beryle Beams, MD;  Location: [facility];  Service: Endoscopy;  Laterality: N/A;   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   Procedure: LAPAROSCOPIC ROUX-EN-Y GASTRIC BYPASS WITH UPPER ENDOSCOPY;  Surgeon: Excell Seltzer, MD;  Location: [facility];  Service: General;  Laterality: N/A;   KNEE ARTHROSCOPY  2001   rt knee   OPEN SUBSCAPULARIS REPAIR  01/13/2023   Procedure: OPEN  SUBSCAPULARIS REPAIR;  Surgeon: [name], MD;  Location: [facility];  Service: Orthopedics;;   SHOULDER ARTHROSCOPY WITH BICEPSTENOTOMY  01/13/2023   Procedure: SHOULDER ARTHROSCOPY WITH BICEPS TENOTOMY;  Surgeon: [name], MD;  Location: [facility];  Service: Orthopedics;;   UPPER GI ENDOSCOPY  11/05/2014   Procedure: UPPER GI ENDOSCOPY;  Surgeon: Excell Seltzer, MD;  Location: [facility];  Service: General;;   Patient Active Problem List   Diagnosis Date Noted   History of colonic polyps 11/27/2021   Morbid obesity 04/01/2014   Hypercholesteremia    Hypertension    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    Metabolic syndrome     ONSET DATE: 01/13/23  REFERRING DIAG: s/p left arthroscopic rotator cuff repair of massive superior cuff tear, with open repair of subscapularis   THERAPY DIAG:  Acute pain of left shoulder  Stiffness of left shoulder, not elsewhere classified  Other symptoms and signs involving the musculoskeletal system  Rationale for Evaluation and Treatment: Rehabilitation  SUBJECTIVE:   SUBJECTIVE STATEMENT: S: "It feels good!"  PERTINENT HISTORY: Pt is a 57 y/o male s/p s/p left arthroscopic rotator cuff repair of massive superior cuff tear, with open repair of subscapularis on 01/13/23. Pt noted to have a massive tear and is on  a conservative protocol.   PRECAUTIONS: Shoulder, see protocol for massive tear  WEIGHT BEARING RESTRICTIONS: Yes NWB  PAIN:  Are you having pain? No  FALLS: Has patient fallen in last 6 months? Yes. Number of falls 1  PLOF: Independent  PATIENT GOALS: To be able to use the LUE.   NEXT MD VISIT: 04/19/23  OBJECTIVE:   HAND DOMINANCE: Right  ADLs: Overall ADLs: Pt is having difficulty with sleeping,   FUNCTIONAL OUTCOME MEASURES: FOTO: 40/100  UPPER EXTREMITY ROM:     Assessed supine, er/IR adducted  Passive ROM Left eval Left 03/14/23  Shoulder flexion 128 145  Shoulder abduction 82 156  Shoulder  internal rotation 90 90  Shoulder external rotation 52 63  (Blank rows = not tested)  Assessed seated, er/IR adducted  Active ROM Left eval Left 03/14/23  Shoulder flexion  103  Shoulder abduction  76  Shoulder internal rotation  90  Shoulder external rotation  38  (Blank rows = not tested)   UPPER EXTREMITY MMT:       Assessed seated, er/IR adducted  MMT Left eval Left 03/14/23  Shoulder flexion  3-/5  Shoulder abduction  3-/5  Shoulder internal rotation  3/5  Shoulder external rotation  3/5  (Blank rows = not tested)   EDEMA: Edema observed at left upper arm and anterior shoulder   OBSERVATIONS: Mod fascial restrictions noted in left upper arm, anterior shoulder, trapezius, and scapular regions   TODAY'S TREATMENT:                                                                                                                              DATE:  03/21/23 -Myofascial release to deltoid, upper biceps, posterior traps, axillary region to decrease fascial restrictions, and increase joint ROM- noted increased restrictions to upper deltoid  -P/ROM: supine-flexion, abduction, er/IR, horizontal abduction, 10 reps -A/ROM: supine- flexion, abduction, protraction, horizontal abduction, er/IR, x10 -Proximal shoulder strengthening: supine-paddles, criss cross, circles both directions, x10 -A/ROM: standing- flexion, abduction, protraction, horizontal abduction, er/IR, x10 -Proximal shoulder strengthening: standing-paddles, criss cross, circles both directions, x10, 1 rest break -Wall wash: 1' flexion -proximal shoulder strengthening on doorway: 1' flexion, 90 degrees  -Overhead lacing: seated, lacing from top down then reversing -Scapular theraband: red-row, extension, retraction, 10 reps -UBE: level 2, 3' forward, 3' reverse, pace: 14.5  03/18/23 -Myofascial release to deltoid, upper biceps, posterior traps, axillary region to decrease fascial restrictions, and increase joint ROM-  noted increased restrictions to upper deltoid  -AA/ROM: supine-flexion, abduction, protraction, horizontal abduction, er/IR, x12 -A/ROM: supine- flexion, abduction, protraction, horizontal abduction, er/IR, x10 -AA/ROM: seated-flexion, abduction, protraction, horizontal abduction, er/IR, x10 -Proximal shoulder: paddles, criss cross, circles both directions, x10 -UBE: level 1, 2.5' forwards and backwards, 3.0+KPH  03/14/23 -Myofascial release to deltoid, upper biceps, posterior traps, axillary region to decrease fascial restrictions, and increase joint ROM- noted increased restrictions to upper deltoid  -AA/ROM: seated-flexion, abduction, protraction, horizontal abduction, er/IR, x12 -AA/ROM:  seated-flexion, abduction, protraction, horizontal abduction, er/IR, x12 -Wall wash: 1' flexion -PVC Pipe slide: 10X flexion -Scapular Strengthening: red theraband, extension, rows, x10   PATIENT EDUCATION: Education details: reviewed HEP Person educated: Patient Education method: Consulting civil engineer, Demonstration, and Handouts Education comprehension: verbalized understanding and returned demonstration  HOME EXERCISE PROGRAM: Eval: table slides 3/4: Isometrics - flexion, abduction, internal rotation only 3/18: AA/ROM 3/21: Scapular Strengthening and wall slides 3/29: A/ROM  GOALS: Goals reviewed with patient? Yes  SHORT TERM GOALS: Target date: 03/17/23  Pt will be provided with and educated on HEP to improve mobility in LUE required for use during ADL completion.   Goal status: MET  2.  Pt will increase LUE P/ROM by 40 degrees or greater to improve ability to use LUE during dressing tasks with minimal compensatory techniques.   Goal status: IN PROGRESS  3.  Pt will increase LUE strength to 3+/5 to improve ability to reach for items at waist to chest height during bathing and grooming tasks.   Goal status: IN PROGRESS     LONG TERM GOALS: Target date: 04/18/23  Pt will decrease pain in  LUE to 3/10 or less to improve ability to sleep for 2+ consecutive hours without waking due to pain.   Goal status: IN PROGRESS  2.  Pt will decrease LUE fascial restrictions to min amounts or less to improve mobility required for functional reaching tasks.   Goal status: IN PROGRESS  3.  Pt will increase LUE A/ROM to at least 145 for flexion/abduction, and 50 degrees er/IR to improve ability to use LUE when reaching overhead or behind back during dressing and bathing tasks.   Goal status: IN PROGRESS  4.  Pt will increase LUE strength to 4+/5 or greater to improve ability to use LUE when lifting or carrying items during meal preparation/housework/yardwork tasks.   Goal status: IN PROGRESS  5.  Pt will return to highest level of function using LUE as non-dominant during functional task completion.   Goal status: IN PROGRESS  ASSESSMENT:  CLINICAL IMPRESSION: Pt reports he is noticing a lot of improvement at home. He is able to pull up his pants and thread most of his belt, was able to clean leaves out of the pool without difficulty. Continued with manual techniques, passive stretching, and A/ROM in supine and standing. Pt able to achieve 90 degrees of active abduction in standing, finished motion using cane for AA/ROM. Added proximal shoulder strengthening on doorway and overhead lacing, cuing to drop shoulder and depress trapezius as able. Verbal cuing for form and technique throughout session.    PERFORMANCE DEFICITS: in functional skills including ADLs, IADLs, ROM, strength, pain, fascial restrictions, and UE functional use   PLAN:  OT FREQUENCY: 2x/week  OT DURATION: 8 weeks  PLANNED INTERVENTIONS: self care/ADL training, therapeutic exercise, therapeutic activity, manual therapy, passive range of motion, splinting, electrical stimulation, ultrasound, moist heat, cryotherapy, patient/family education, and DME and/or AE instructions  CONSULTED AND AGREED WITH PLAN OF CARE:  Patient  PLAN FOR NEXT SESSION: ontinue protocol Phase III-manual techniques, P/ROM, AA/ROM progressing to A/ROM, scapular strengthening, Proximal exercises    Guadelupe Sabin, OTR/L  [phone] 03/21/2023, 11:01 AM

## 2023-03-24 ENCOUNTER — Ambulatory Visit (HOSPITAL_COMMUNITY): Payer: Federal, State, Local not specified - PPO | Admitting: Occupational Therapy

## 2023-03-24 ENCOUNTER — Encounter (HOSPITAL_COMMUNITY): Payer: Self-pay | Admitting: Occupational Therapy

## 2023-03-24 DIAGNOSIS — M25512 Pain in left shoulder: Secondary | ICD-10-CM | POA: Diagnosis not present

## 2023-03-24 DIAGNOSIS — R29898 Other symptoms and signs involving the musculoskeletal system: Secondary | ICD-10-CM

## 2023-03-24 DIAGNOSIS — M25612 Stiffness of left shoulder, not elsewhere classified: Secondary | ICD-10-CM | POA: Diagnosis not present

## 2023-03-24 NOTE — Therapy (Signed)
OUTPATIENT OCCUPATIONAL THERAPY ORTHO TREATMENT NOTE  Patient Name: Brian Newton MRN: [id number] DOB:1966/09/13, 57 y.o., male Today's Date: 03/24/2023  PCP: Dr. Lynnea Ferrier  REFERRING PROVIDER: Dr. Thane Edu  END OF SESSION:  OT End of Session - 03/24/23 0906     Visit Number 12    Number of Visits 16    Date for OT Re-Evaluation 04/18/23    Authorization Type [employer]    Authorization Time Period 50 visit limit    Authorization - Visit Number 11    Authorization - Number of Visits 50    Progress Note Due on Visit 10    OT Start Time 0905    OT Stop Time 0945    OT Time Calculation (min) 40 min    Activity Tolerance Patient tolerated treatment well    Behavior During Therapy WFL for tasks assessed/performed               Past Medical History:  Diagnosis Date   Arthritis    Borderline hyperglycemia    Headache(784.0)    Hypercholesteremia    Hypertension    Metabolic syndrome    Shingles    Sleep apnea    uses C PAP   Swelling    both legs   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    Wrist pain    Past Surgical History:  Procedure Laterality Date   APPENDECTOMY  1986   ARTHOSCOPIC ROTAOR CUFF REPAIR Left 01/13/2023   Procedure: ARTHROSCOPIC ROTATOR CUFF REPAIR;  Surgeon: Oliver Barre, MD;  Location: [facility];  Service: Orthopedics;  Laterality: Left;   BREATH TEK H PYLORI N/A 08/08/2014   Procedure: BREATH TEK H PYLORI;  Surgeon: Mariella Saa, MD;  Location: [facility];  Service: General;  Laterality: N/A;   COLONOSCOPY N/A 03/02/2013   Procedure: COLONOSCOPY;  Surgeon: Theda Belfast, MD;  Location: [facility];  Service: Endoscopy;  Laterality: N/A;   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   Procedure: LAPAROSCOPIC ROUX-EN-Y GASTRIC BYPASS WITH UPPER ENDOSCOPY;  Surgeon: Glenna Fellows, MD;  Location: [facility];  Service: General;  Laterality: N/A;   KNEE ARTHROSCOPY  2001   rt knee   OPEN SUBSCAPULARIS REPAIR  01/13/2023   Procedure: OPEN  SUBSCAPULARIS REPAIR;  Surgeon: Oliver Barre, MD;  Location: [facility];  Service: Orthopedics;;   SHOULDER ARTHROSCOPY WITH BICEPSTENOTOMY  01/13/2023   Procedure: SHOULDER ARTHROSCOPY WITH BICEPS TENOTOMY;  Surgeon: Oliver Barre, MD;  Location: [facility];  Service: Orthopedics;;   UPPER GI ENDOSCOPY  11/05/2014   Procedure: UPPER GI ENDOSCOPY;  Surgeon: Glenna Fellows, MD;  Location: [facility];  Service: General;;   Patient Active Problem List   Diagnosis Date Noted   History of colonic polyps 11/27/2021   Morbid obesity 04/01/2014   Hypercholesteremia    Hypertension    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    Metabolic syndrome     ONSET DATE: 01/13/23  REFERRING DIAG: s/p left arthroscopic rotator cuff repair of massive superior cuff tear, with open repair of subscapularis   THERAPY DIAG:  Acute pain of left shoulder  Stiffness of left shoulder, not elsewhere classified  Other symptoms and signs involving the musculoskeletal system  Rationale for Evaluation and Treatment: Rehabilitation  SUBJECTIVE:   SUBJECTIVE STATEMENT: S: "I still can't get it up like I can when I'm laying down."  PERTINENT HISTORY: Pt is a 57 y/o male s/p s/p left arthroscopic rotator cuff repair of massive superior cuff tear, with open repair of subscapularis on 01/13/23.  Pt noted to have a massive tear and is on a conservative protocol.   PRECAUTIONS: Shoulder, see protocol for massive tear  WEIGHT BEARING RESTRICTIONS: Yes NWB  PAIN:  Are you having pain? No  FALLS: Has patient fallen in last 6 months? Yes. Number of falls 1  PLOF: Independent  PATIENT GOALS: To be able to use the LUE.   NEXT MD VISIT: 04/19/23  OBJECTIVE:   HAND DOMINANCE: Right  ADLs: Overall ADLs: Pt is having difficulty with sleeping,   FUNCTIONAL OUTCOME MEASURES: FOTO: 40/100  UPPER EXTREMITY ROM:     Assessed supine, er/IR adducted  Passive ROM Left eval Left 03/14/23  Shoulder flexion 128 145   Shoulder abduction 82 156  Shoulder internal rotation 90 90  Shoulder external rotation 52 63  (Blank rows = not tested)  Assessed seated, er/IR adducted  Active ROM Left eval Left 03/14/23  Shoulder flexion  103  Shoulder abduction  76  Shoulder internal rotation  90  Shoulder external rotation  38  (Blank rows = not tested)   UPPER EXTREMITY MMT:       Assessed seated, er/IR adducted  MMT Left eval Left 03/14/23  Shoulder flexion  3-/5  Shoulder abduction  3-/5  Shoulder internal rotation  3/5  Shoulder external rotation  3/5  (Blank rows = not tested)   EDEMA: Edema observed at left upper arm and anterior shoulder   OBSERVATIONS: Mod fascial restrictions noted in left upper arm, anterior shoulder, trapezius, and scapular regions   TODAY'S TREATMENT:                                                                                                                              DATE:  03/24/23 -Myofascial release to deltoid, upper biceps, posterior traps, axillary region to decrease fascial restrictions, and increase joint ROM- noted increased restrictions to upper deltoid  -A/ROM: supine- flexion, abduction, protraction, horizontal abduction, er/IR, x10 -Arms on fire x60" -AA/ROM: seated-flexion, abduction, protraction, horizontal abduction, er/IR, x10 -A/ROM: standing- flexion, abduction, protraction, horizontal abduction, er/IR, x10 -Wall Slides: flexion and abduction, x10 -proximal shoulder strengthening on doorway: 1' flexion, 90 degrees  -Scapular theraband: green -row, extension, retraction, protraction 15 reps -[facility] on the wall flexion, x10  03/21/23 -Myofascial release to deltoid, upper biceps, posterior traps, axillary region to decrease fascial restrictions, and increase joint ROM- noted increased restrictions to upper deltoid  -P/ROM: supine-flexion, abduction, er/IR, horizontal abduction, 10 reps -A/ROM: supine- flexion, abduction, protraction,  horizontal abduction, er/IR, x10 -Proximal shoulder strengthening: supine-paddles, criss cross, circles both directions, x10 -A/ROM: standing- flexion, abduction, protraction, horizontal abduction, er/IR, x10 -Proximal shoulder strengthening: standing-paddles, criss cross, circles both directions, x10, 1 rest break -Wall wash: 1' flexion -proximal shoulder strengthening on doorway: 1' flexion, 90 degrees  -Overhead lacing: seated, lacing from top down then reversing -Scapular theraband: red-row, extension, retraction, 10 reps -UBE: level 2, 3' forward, 3' reverse, pace: 14.5  03/18/23 -Myofascial release to deltoid,  upper biceps, posterior traps, axillary region to decrease fascial restrictions, and increase joint ROM- noted increased restrictions to upper deltoid  -AA/ROM: supine-flexion, abduction, protraction, horizontal abduction, er/IR, x12 -A/ROM: supine- flexion, abduction, protraction, horizontal abduction, er/IR, x10 -AA/ROM: seated-flexion, abduction, protraction, horizontal abduction, er/IR, x10 -Proximal shoulder: paddles, criss cross, circles both directions, x10 -UBE: level 1, 2.5' forwards and backwards, 3.0+KPH   PATIENT EDUCATION: Education details: Increased to green theraband for scap strengthening Person educated: Patient Education method: Explanation, Demonstration, and Handouts Education comprehension: verbalized understanding and returned demonstration  HOME EXERCISE PROGRAM: Eval: table slides 3/4: Isometrics - flexion, abduction, internal rotation only 3/18: AA/ROM 3/21: Scapular Strengthening and wall slides 3/29: A/ROM  GOALS: Goals reviewed with patient? Yes  SHORT TERM GOALS: Target date: 03/17/23  Pt will be provided with and educated on HEP to improve mobility in LUE required for use during ADL completion.   Goal status: MET  2.  Pt will increase LUE P/ROM by 40 degrees or greater to improve ability to use LUE during dressing tasks with minimal  compensatory techniques.   Goal status: IN PROGRESS  3.  Pt will increase LUE strength to 3+/5 to improve ability to reach for items at waist to chest height during bathing and grooming tasks.   Goal status: IN PROGRESS     LONG TERM GOALS: Target date: 04/18/23  Pt will decrease pain in LUE to 3/10 or less to improve ability to sleep for 2+ consecutive hours without waking due to pain.   Goal status: IN PROGRESS  2.  Pt will decrease LUE fascial restrictions to min amounts or less to improve mobility required for functional reaching tasks.   Goal status: IN PROGRESS  3.  Pt will increase LUE A/ROM to at least 145 for flexion/abduction, and 50 degrees er/IR to improve ability to use LUE when reaching overhead or behind back during dressing and bathing tasks.   Goal status: IN PROGRESS  4.  Pt will increase LUE strength to 4+/5 or greater to improve ability to use LUE when lifting or carrying items during meal preparation/housework/yardwork tasks.   Goal status: IN PROGRESS  5.  Pt will return to highest level of function using LUE as non-dominant during functional task completion.   Goal status: IN PROGRESS  ASSESSMENT:  CLINICAL IMPRESSION: This session, pt continuing to demonstrate good improvements with all ROM and strength. He continues to have difficulty with abduction in standing, reporting that it feels weak. OT progressed pt to green theraband during scapula strengthening, which he is tolerating well. Overall he reports that he feels weak as he completes endurance based tasks, however he can complete them with no breaks. OT providing verbal and tactile cuing for positioning and technique.    PERFORMANCE DEFICITS: in functional skills including ADLs, IADLs, ROM, strength, pain, fascial restrictions, and UE functional use   PLAN:  OT FREQUENCY: 2x/week  OT DURATION: 8 weeks  PLANNED INTERVENTIONS: self care/ADL training, therapeutic exercise, therapeutic activity,  manual therapy, passive range of motion, splinting, electrical stimulation, ultrasound, moist heat, cryotherapy, patient/family education, and DME and/or AE instructions  CONSULTED AND AGREED WITH PLAN OF CARE: Patient  PLAN FOR NEXT SESSION: ontinue protocol Phase III-manual techniques, P/ROM, AA/ROM progressing to A/ROM, scapular strengthening, Proximal exercises, trial shoulder strengthening with light dumbbells    Trish Mage, OTR/L [phone] 03/24/2023, 9:07 AM

## 2023-03-29 ENCOUNTER — Ambulatory Visit (HOSPITAL_COMMUNITY): Payer: Federal, State, Local not specified - PPO | Admitting: Occupational Therapy

## 2023-03-29 ENCOUNTER — Encounter (HOSPITAL_COMMUNITY): Payer: Self-pay | Admitting: Occupational Therapy

## 2023-03-29 DIAGNOSIS — M25612 Stiffness of left shoulder, not elsewhere classified: Secondary | ICD-10-CM

## 2023-03-29 DIAGNOSIS — R29898 Other symptoms and signs involving the musculoskeletal system: Secondary | ICD-10-CM | POA: Diagnosis not present

## 2023-03-29 DIAGNOSIS — M25512 Pain in left shoulder: Secondary | ICD-10-CM | POA: Diagnosis not present

## 2023-03-29 NOTE — Therapy (Signed)
OUTPATIENT OCCUPATIONAL THERAPY ORTHO TREATMENT NOTE  Patient Name: Brian Newton MRN: [id number] DOB:[birth year], 57 y.o., male Today's Date: 03/24/2023  PCP: Dr. Lynnea Ferrier  REFERRING PROVIDER: Dr. Thane Edu  END OF SESSION:  OT End of Session - 03/24/23 0906     Visit Number 12    Number of Visits 16    Date for OT Re-Evaluation 04/18/23    Authorization Type BC[employer]    Authorization Time Period 50 visit limit    Authorization - Visit Number 11    Authorization - Number of Visits 50    Progress Note Due on Visit 10    OT Start Time 0905    OT Stop Time 0945    OT Time Calculation (min) 40 min    Activity Tolerance Patient tolerated treatment well    Behavior During Therapy WFL for tasks assessed/performed              Past Medical History:  Diagnosis Date   Arthritis    Borderline hyperglycemia    Headache(784.0)    Hypercholesteremia    Hypertension    Metabolic syndrome    Shingles    Sleep apnea    uses C PAP   Swelling    both legs   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    Wrist pain    Past Surgical History:  Procedure Laterality Date   APPENDECTOMY  1986   ARTHOSCOPIC ROTAOR CUFF REPAIR Left 01/13/2023   Procedure: ARTHROSCOPIC ROTATOR CUFF REPAIR;  Surgeon: Oliver Barre, MD;  Location: [facility];  Service: Orthopedics;  Laterality: Left;   BREATH TEK H PYLORI N/A 08/08/2014   Procedure: BREATH TEK H PYLORI;  Surgeon: Mariella Saa, MD;  Location: [facility];  Service: General;  Laterality: N/A;   COLONOSCOPY N/A 03/02/2013   Procedure: COLONOSCOPY;  Surgeon: Theda Belfast, MD;  Location: [facility];  Service: Endoscopy;  Laterality: N/A;   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   Procedure: LAPAROSCOPIC ROUX-EN-Y GASTRIC BYPASS WITH UPPER ENDOSCOPY;  Surgeon: Glenna Fellows, MD;  Location: [facility];  Service: General;  Laterality: N/A;   KNEE ARTHROSCOPY  2001   rt knee   OPEN SUBSCAPULARIS REPAIR  01/13/2023   Procedure: OPEN  SUBSCAPULARIS REPAIR;  Surgeon: Oliver Barre, MD;  Location: [facility];  Service: Orthopedics;;   SHOULDER ARTHROSCOPY WITH BICEPSTENOTOMY  01/13/2023   Procedure: SHOULDER ARTHROSCOPY WITH BICEPS TENOTOMY;  Surgeon: Oliver Barre, MD;  Location: [facility];  Service: Orthopedics;;   UPPER GI ENDOSCOPY  11/05/2014   Procedure: UPPER GI ENDOSCOPY;  Surgeon: Glenna Fellows, MD;  Location: [facility];  Service: General;;   Patient Active Problem List   Diagnosis Date Noted   History of colonic polyps 11/27/2021   Morbid obesity 04/01/2014   Hypercholesteremia    Hypertension    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    Metabolic syndrome     ONSET DATE: 01/13/23  REFERRING DIAG: s/p left arthroscopic rotator cuff repair of massive superior cuff tear, with open repair of subscapularis   THERAPY DIAG:  Acute pain of left shoulder  Stiffness of left shoulder, not elsewhere classified  Other symptoms and signs involving the musculoskeletal system  Rationale for Evaluation and Treatment: Rehabilitation  SUBJECTIVE:   SUBJECTIVE STATEMENT: S: "I can lift my arm all the way up now!"  PERTINENT HISTORY: Pt is a 57 y/o male s/p s/p left arthroscopic rotator cuff repair of massive superior cuff tear, with open repair of subscapularis on 01/13/23. Pt noted to have  a massive tear and is on a conservative protocol.   PRECAUTIONS: Shoulder, see protocol for massive tear  WEIGHT BEARING RESTRICTIONS: Yes NWB  PAIN:  Are you having pain? No  FALLS: Has patient fallen in last 6 months? Yes. Number of falls 1  PLOF: Independent  PATIENT GOALS: To be able to use the LUE.   NEXT MD VISIT: 04/19/23  OBJECTIVE:   HAND DOMINANCE: Right  ADLs: Overall ADLs: Pt is having difficulty with sleeping,   FUNCTIONAL OUTCOME MEASURES: FOTO: 40/100  UPPER EXTREMITY ROM:     Assessed supine, er/IR adducted  Passive ROM Left eval Left 03/14/23  Shoulder flexion 128 145  Shoulder abduction  82 156  Shoulder internal rotation 90 90  Shoulder external rotation 52 63  (Blank rows = not tested)  Assessed seated, er/IR adducted  Active ROM Left eval Left 03/14/23  Shoulder flexion  103  Shoulder abduction  76  Shoulder internal rotation  90  Shoulder external rotation  38  (Blank rows = not tested)   UPPER EXTREMITY MMT:       Assessed seated, er/IR adducted  MMT Left eval Left 03/14/23  Shoulder flexion  3-/5  Shoulder abduction  3-/5  Shoulder internal rotation  3/5  Shoulder external rotation  3/5  (Blank rows = not tested)   EDEMA: Edema observed at left upper arm and anterior shoulder   OBSERVATIONS: Mod fascial restrictions noted in left upper arm, anterior shoulder, trapezius, and scapular regions   TODAY'S TREATMENT:                                                                                                                              DATE:  03/29/23 -A/ROM: standing- flexion, abduction, protraction, horizontal abduction, er/IR, x10 -Ball Rolls on the wall flexion, x10 -Therapy ball exercises: flexion, protraction, circles both directions, x10 -Ball on the wall: ABC's -Shoulder Strengthening: 1lb dumbbell, flexion, abduction, protraction, horizontal abduction, er/IR, x10 - using mirror to limit shoulder hiking -functional reaching: 1.5lb wrist weight, 10 items from shoulder height shelf, 10 items from overhead shelf -Scapular theraband: green -row, extension, retraction, protraction 15 reps -UBE level 3, 3.0 KPH or higher, 3 mins forwards and backwards  03/24/23 -Myofascial release to deltoid, upper biceps, posterior traps, axillary region to decrease fascial restrictions, and increase joint ROM- noted increased restrictions to upper deltoid  -A/ROM: supine- flexion, abduction, protraction, horizontal abduction, er/IR, x10 -Arms on fire x60" -AA/ROM: seated-flexion, abduction, protraction, horizontal abduction, er/IR, x10 -A/ROM: standing-  flexion, abduction, protraction, horizontal abduction, er/IR, x10 -Wall Slides: flexion and abduction, x10 -proximal shoulder strengthening on doorway: 1' flexion, 90 degrees  -Scapular theraband: green -row, extension, retraction, protraction 15 reps -[facility] on the wall flexion, x10  03/21/23 -Myofascial release to deltoid, upper biceps, posterior traps, axillary region to decrease fascial restrictions, and increase joint ROM- noted increased restrictions to upper deltoid  -P/ROM: supine-flexion, abduction, er/IR, horizontal abduction, 10 reps -A/ROM: supine- flexion,  abduction, protraction, horizontal abduction, er/IR, x10 -Proximal shoulder strengthening: supine-paddles, criss cross, circles both directions, x10 -A/ROM: standing- flexion, abduction, protraction, horizontal abduction, er/IR, x10 -Proximal shoulder strengthening: standing-paddles, criss cross, circles both directions, x10, 1 rest break -Wall wash: 1' flexion -proximal shoulder strengthening on doorway: 1' flexion, 90 degrees  -Overhead lacing: seated, lacing from top down then reversing -Scapular theraband: red-row, extension, retraction, 10 reps -UBE: level 2, 3' forward, 3' reverse, pace: 14.5    PATIENT EDUCATION: Education details: Functional Reaching in shelf height cabinet Person educated: Patient Education method: Explanation, Demonstration, and Handouts Education comprehension: verbalized understanding and returned demonstration  HOME EXERCISE PROGRAM: Eval: table slides 3/4: Isometrics - flexion, abduction, internal rotation only 3/18: AA/ROM 3/21: Scapular Strengthening and wall slides 3/29: A/ROM 4/9: Functional Reaching in shelf height cabinet  GOALS: Goals reviewed with patient? Yes  SHORT TERM GOALS: Target date: 03/17/23  Pt will be provided with and educated on HEP to improve mobility in LUE required for use during ADL completion.   Goal status: MET  2.  Pt will increase LUE P/ROM by 40  degrees or greater to improve ability to use LUE during dressing tasks with minimal compensatory techniques.   Goal status: IN PROGRESS  3.  Pt will increase LUE strength to 3+/5 to improve ability to reach for items at waist to chest height during bathing and grooming tasks.   Goal status: IN PROGRESS     LONG TERM GOALS: Target date: 04/18/23  Pt will decrease pain in LUE to 3/10 or less to improve ability to sleep for 2+ consecutive hours without waking due to pain.   Goal status: IN PROGRESS  2.  Pt will decrease LUE fascial restrictions to min amounts or less to improve mobility required for functional reaching tasks.   Goal status: IN PROGRESS  3.  Pt will increase LUE A/ROM to at least 145 for flexion/abduction, and 50 degrees er/IR to improve ability to use LUE when reaching overhead or behind back during dressing and bathing tasks.   Goal status: IN PROGRESS  4.  Pt will increase LUE strength to 4+/5 or greater to improve ability to use LUE when lifting or carrying items during meal preparation/housework/yardwork tasks.   Goal status: IN PROGRESS  5.  Pt will return to highest level of function using LUE as non-dominant during functional task completion.   Goal status: IN PROGRESS  ASSESSMENT:  CLINICAL IMPRESSION: Pt working on functional strengthening this session. He was able to tolerate 1lb dumbbells with shoulder strengthening, and is continuing to do well with the green theraband for scapular strengthening. OT had pt start working on functional reaching into a cabinet at shoulder height with 1.5lb wrist weight donned for light input to resemble items weights. Overall he continues to have significant shoulder hiking during all motions in standing. OT had pt face a mirror to watch as his shoulder begins to lift and to stop his motion at whatever degree that is at for good movement pattern and minimal strain on the shoulder. Verbal and tactile cuing was also provided  throughout the session for body mechanics and technique with all exercises.    PERFORMANCE DEFICITS: in functional skills including ADLs, IADLs, ROM, strength, pain, fascial restrictions, and UE functional use   PLAN:  OT FREQUENCY: 2x/week  OT DURATION: 8 weeks  PLANNED INTERVENTIONS: self care/ADL training, therapeutic exercise, therapeutic activity, manual therapy, passive range of motion, splinting, electrical stimulation, ultrasound, moist heat, cryotherapy, patient/family education, and DME and/or  AE instructions  CONSULTED AND AGREED WITH PLAN OF CARE: Patient  PLAN FOR NEXT SESSION: ontinue protocol Phase III-manual techniques, P/ROM, AA/ROM progressing to A/ROM, scapular strengthening, Proximal exercises, shoulder strengthening with light dumbbells    Trish MageClara Mclean Moya, OTR/L [phone] 03/24/2023, 9:07 AM

## 2023-04-01 ENCOUNTER — Encounter (HOSPITAL_COMMUNITY): Payer: Self-pay | Admitting: Occupational Therapy

## 2023-04-01 ENCOUNTER — Ambulatory Visit (HOSPITAL_COMMUNITY): Payer: Federal, State, Local not specified - PPO | Admitting: Occupational Therapy

## 2023-04-01 DIAGNOSIS — M25612 Stiffness of left shoulder, not elsewhere classified: Secondary | ICD-10-CM | POA: Diagnosis not present

## 2023-04-01 DIAGNOSIS — M25512 Pain in left shoulder: Secondary | ICD-10-CM

## 2023-04-01 DIAGNOSIS — R29898 Other symptoms and signs involving the musculoskeletal system: Secondary | ICD-10-CM | POA: Diagnosis not present

## 2023-04-01 NOTE — Therapy (Unsigned)
OUTPATIENT OCCUPATIONAL THERAPY ORTHO TREATMENT NOTE  Patient Name: Brian Newton MRN: [id number] DOB:[birth year], 57 y.o., male Today's Date: 03/24/2023  PCP: Dr. Lynnea Ferrier  REFERRING PROVIDER: Dr. Thane Edu  END OF SESSION:  OT End of Session - 03/24/23 0906     Visit Number 12    Number of Visits 16    Date for OT Re-Evaluation 04/18/23    Authorization Type BC[employer]    Authorization Time Period 50 visit limit    Authorization - Visit Number 11    Authorization - Number of Visits 50    Progress Note Due on Visit 10    OT Start Time 0905    OT Stop Time 0945    OT Time Calculation (min) 40 min    Activity Tolerance Patient tolerated treatment well    Behavior During Therapy WFL for tasks assessed/performed              Past Medical History:  Diagnosis Date   Arthritis    Borderline hyperglycemia    Headache(784.0)    Hypercholesteremia    Hypertension    Metabolic syndrome    Shingles    Sleep apnea    uses C PAP   Swelling    both legs   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    Wrist pain    Past Surgical History:  Procedure Laterality Date   APPENDECTOMY  1986   ARTHOSCOPIC ROTAOR CUFF REPAIR Left 01/13/2023   Procedure: ARTHROSCOPIC ROTATOR CUFF REPAIR;  Surgeon: Oliver Barre, MD;  Location: [facility];  Service: Orthopedics;  Laterality: Left;   BREATH TEK H PYLORI N/A 08/08/2014   Procedure: BREATH TEK H PYLORI;  Surgeon: Mariella Saa, MD;  Location: [facility];  Service: General;  Laterality: N/A;   COLONOSCOPY N/A 03/02/2013   Procedure: COLONOSCOPY;  Surgeon: Theda Belfast, MD;  Location: [facility];  Service: Endoscopy;  Laterality: N/A;   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   Procedure: LAPAROSCOPIC ROUX-EN-Y GASTRIC BYPASS WITH UPPER ENDOSCOPY;  Surgeon: Glenna Fellows, MD;  Location: [facility];  Service: General;  Laterality: N/A;   KNEE ARTHROSCOPY  2001   rt knee   OPEN SUBSCAPULARIS REPAIR  01/13/2023   Procedure: OPEN  SUBSCAPULARIS REPAIR;  Surgeon: Oliver Barre, MD;  Location: [facility];  Service: Orthopedics;;   SHOULDER ARTHROSCOPY WITH BICEPSTENOTOMY  01/13/2023   Procedure: SHOULDER ARTHROSCOPY WITH BICEPS TENOTOMY;  Surgeon: Oliver Barre, MD;  Location: [facility];  Service: Orthopedics;;   UPPER GI ENDOSCOPY  11/05/2014   Procedure: UPPER GI ENDOSCOPY;  Surgeon: Glenna Fellows, MD;  Location: [facility];  Service: General;;   Patient Active Problem List   Diagnosis Date Noted   History of colonic polyps 11/27/2021   Morbid obesity 04/01/2014   Hypercholesteremia    Hypertension    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    Metabolic syndrome     ONSET DATE: 01/13/23  REFERRING DIAG: s/p left arthroscopic rotator cuff repair of massive superior cuff tear, with open repair of subscapularis   THERAPY DIAG:  Acute pain of left shoulder  Stiffness of left shoulder, not elsewhere classified  Other symptoms and signs involving the musculoskeletal system  Rationale for Evaluation and Treatment: Rehabilitation  SUBJECTIVE:   SUBJECTIVE STATEMENT: S: "My shoulder has just felt very tight the past few days."  PERTINENT HISTORY: Pt is a 57 y/o male s/p s/p left arthroscopic rotator cuff repair of massive superior cuff tear, with open repair of subscapularis on 01/13/23. Pt noted to  have a massive tear and is on a conservative protocol.   PRECAUTIONS: Shoulder, see protocol for massive tear  WEIGHT BEARING RESTRICTIONS: Yes NWB  PAIN:  Are you having pain? No  FALLS: Has patient fallen in last 6 months? Yes. Number of falls 1  PLOF: Independent  PATIENT GOALS: To be able to use the LUE.   NEXT MD VISIT: 04/19/23  OBJECTIVE:   HAND DOMINANCE: Right  ADLs: Overall ADLs: Pt is having difficulty with sleeping,   FUNCTIONAL OUTCOME MEASURES: FOTO: 40/100  UPPER EXTREMITY ROM:     Assessed supine, er/IR adducted  Passive ROM Left eval Left 03/14/23  Shoulder flexion 128 145   Shoulder abduction 82 156  Shoulder internal rotation 90 90  Shoulder external rotation 52 63  (Blank rows = not tested)  Assessed seated, er/IR adducted  Active ROM Left eval Left 03/14/23  Shoulder flexion  103  Shoulder abduction  76  Shoulder internal rotation  90  Shoulder external rotation  38  (Blank rows = not tested)   UPPER EXTREMITY MMT:       Assessed seated, er/IR adducted  MMT Left eval Left 03/14/23  Shoulder flexion  3-/5  Shoulder abduction  3-/5  Shoulder internal rotation  3/5  Shoulder external rotation  3/5  (Blank rows = not tested)   EDEMA: Edema observed at left upper arm and anterior shoulder   OBSERVATIONS: Mod fascial restrictions noted in left upper arm, anterior shoulder, trapezius, and scapular regions   TODAY'S TREATMENT:                                                                                                                              DATE:  04/01/23 -A/ROM: standing- flexion, abduction, protraction, horizontal abduction, er/IR, x10 -Shoulder Strengthening: 1lb dumbbell, flexion, abduction, protraction, horizontal abduction, er/IR, x10 -Arms on fire x70" -Loop Band Exercises: green band, wall taps, wall v ups, wall clocks, x10 -Ball on the [employer] -Overhead lacing  03/29/23 -A/ROM: standing- flexion, abduction, protraction, horizontal abduction, er/IR, x10 -Ball Rolls on the wall flexion, x10 -Therapy ball exercises: flexion, protraction, circles both directions, x10 -Ball on the wall: ABC's -Shoulder Strengthening: 1lb dumbbell, flexion, abduction, protraction, horizontal abduction, er/IR, x10 - using mirror to limit shoulder hiking -functional reaching: 1.5lb wrist weight, 10 items from shoulder height shelf, 10 items from overhead shelf -Scapular theraband: green -row, extension, retraction, protraction 15 reps -UBE level 3, 3.0 KPH or higher, 3 mins forwards and backwards  03/24/23 -Myofascial release to deltoid,  upper biceps, posterior traps, axillary region to decrease fascial restrictions, and increase joint ROM- noted increased restrictions to upper deltoid  -A/ROM: supine- flexion, abduction, protraction, horizontal abduction, er/IR, x10 -Arms on fire x60" -AA/ROM: seated-flexion, abduction, protraction, horizontal abduction, er/IR, x10 -A/ROM: standing- flexion, abduction, protraction, horizontal abduction, er/IR, x10 -Wall Slides: flexion and abduction, x10 -proximal shoulder strengthening on doorway: 1' flexion, 90 degrees  -Scapular theraband: green -row, extension, retraction, protraction  15 reps -[facility] on the wall flexion, x10     PATIENT EDUCATION: Education details: Loop Band Exercises Person educated: Patient Education method: Explanation, Demonstration, and Handouts Education comprehension: verbalized understanding and returned demonstration  HOME EXERCISE PROGRAM: Eval: table slides 3/4: Isometrics - flexion, abduction, internal rotation only 3/18: AA/ROM 3/21: Scapular Strengthening and wall slides 3/29: A/ROM 4/9: Functional Reaching in shelf height cabinet 4/12: Loop Band Exercises  GOALS: Goals reviewed with patient? Yes  SHORT TERM GOALS: Target date: 03/17/23  Pt will be provided with and educated on HEP to improve mobility in LUE required for use during ADL completion.   Goal status: MET  2.  Pt will increase LUE P/ROM by 40 degrees or greater to improve ability to use LUE during dressing tasks with minimal compensatory techniques.   Goal status: IN PROGRESS  3.  Pt will increase LUE strength to 3+/5 to improve ability to reach for items at waist to chest height during bathing and grooming tasks.   Goal status: IN PROGRESS     LONG TERM GOALS: Target date: 04/18/23  Pt will decrease pain in LUE to 3/10 or less to improve ability to sleep for 2+ consecutive hours without waking due to pain.   Goal status: IN PROGRESS  2.  Pt will decrease LUE  fascial restrictions to min amounts or less to improve mobility required for functional reaching tasks.   Goal status: IN PROGRESS  3.  Pt will increase LUE A/ROM to at least 145 for flexion/abduction, and 50 degrees er/IR to improve ability to use LUE when reaching overhead or behind back during dressing and bathing tasks.   Goal status: IN PROGRESS  4.  Pt will increase LUE strength to 4+/5 or greater to improve ability to use LUE when lifting or carrying items during meal preparation/housework/yardwork tasks.   Goal status: IN PROGRESS  5.  Pt will return to highest level of function using LUE as non-dominant during functional task completion.   Goal status: IN PROGRESS  ASSESSMENT:  CLINICAL IMPRESSION: Pt working on functional strengthening this session. He was able to tolerate 1lb dumbbells with shoulder strengthening, and is continuing to do well with the green theraband for scapular strengthening. OT had pt start working on functional reaching into a cabinet at shoulder height with 1.5lb wrist weight donned for light input to resemble items weights. Overall he continues to have significant shoulder hiking during all motions in standing. OT had pt face a mirror to watch as his shoulder begins to lift and to stop his motion at whatever degree that is at for good movement pattern and minimal strain on the shoulder. Verbal and tactile cuing was also provided throughout the session for body mechanics and technique with all exercises.    PERFORMANCE DEFICITS: in functional skills including ADLs, IADLs, ROM, strength, pain, fascial restrictions, and UE functional use   PLAN:  OT FREQUENCY: 2x/week  OT DURATION: 8 weeks  PLANNED INTERVENTIONS: self care/ADL training, therapeutic exercise, therapeutic activity, manual therapy, passive range of motion, splinting, electrical stimulation, ultrasound, moist heat, cryotherapy, patient/family education, and DME and/or AE  instructions  CONSULTED AND AGREED WITH PLAN OF CARE: Patient  PLAN FOR NEXT SESSION: ontinue protocol Phase III-manual techniques, P/ROM, AA/ROM progressing to A/ROM, scapular strengthening, Proximal exercises, shoulder strengthening with light dumbbells    Trish Mage, OTR/L [phone] 03/24/2023, 9:07 AM

## 2023-04-01 NOTE — Patient Instructions (Signed)
Complete _______ repetitions each. Complete _______ time a day.  Wall taps with theraband  With a looped elastic band around forearms/wrists and arms at a 90 angle pressed against the wall. Keeping elbows on the wall, tap right arm out to the right. Hold for 1 second. Return right arm back to neutral. Repeat with left arm.    Wall V slides with Theraband  Place a band loop around hands/forearms and face a wall. Extend both arms diagonally into a V shape on the wall. Hold this stretch for specified amount of time. Lower arms slowly back into neutral position. Repeat.       scap clocks  Tie a loop with a theraband and place around your wrists.  Stand with a wall in front of you.  Picture a clock in front of you.  Place both palms on the wall, arms straight.  While keeping the left/right hand planted, use the right/left hand to pull away and tap each number (1, 3, 5- right OR 11,9,7 left), coming back to center each time.    

## 2023-04-04 ENCOUNTER — Other Ambulatory Visit: Payer: Self-pay | Admitting: Orthopedic Surgery

## 2023-04-04 DIAGNOSIS — G8929 Other chronic pain: Secondary | ICD-10-CM

## 2023-04-05 ENCOUNTER — Ambulatory Visit (HOSPITAL_COMMUNITY): Payer: Federal, State, Local not specified - PPO | Admitting: Occupational Therapy

## 2023-04-05 DIAGNOSIS — R29898 Other symptoms and signs involving the musculoskeletal system: Secondary | ICD-10-CM | POA: Diagnosis not present

## 2023-04-05 DIAGNOSIS — M25612 Stiffness of left shoulder, not elsewhere classified: Secondary | ICD-10-CM | POA: Diagnosis not present

## 2023-04-05 DIAGNOSIS — M25512 Pain in left shoulder: Secondary | ICD-10-CM

## 2023-04-05 NOTE — Patient Instructions (Signed)

## 2023-04-06 NOTE — Therapy (Signed)
OUTPATIENT OCCUPATIONAL THERAPY ORTHO TREATMENT NOTE  Patient Name: Brian Newton MRN: [id number] DOB:1965-12-28, 57 y.o., male Today's Date: 03/24/2023  PCP: Dr. Lynnea Ferrier  REFERRING PROVIDER: Dr. Thane Edu  END OF SESSION:  OT End of Session - 03/24/23 0906     Visit Number 12    Number of Visits 16    Date for OT Re-Evaluation 04/18/23    Authorization Type [employer]    Authorization Time Period 50 visit limit    Authorization - Visit Number 11    Authorization - Number of Visits 50    Progress Note Due on Visit 10    OT Start Time 0905    OT Stop Time 0945    OT Time Calculation (min) 40 min    Activity Tolerance Patient tolerated treatment well    Behavior During Therapy WFL for tasks assessed/performed              Past Medical History:  Diagnosis Date   Arthritis    Borderline hyperglycemia    Headache(784.0)    Hypercholesteremia    Hypertension    Metabolic syndrome    Shingles    Sleep apnea    uses C PAP   Swelling    both legs   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    Wrist pain    Past Surgical History:  Procedure Laterality Date   APPENDECTOMY  1986   ARTHOSCOPIC ROTAOR CUFF REPAIR Left 01/13/2023   Procedure: ARTHROSCOPIC ROTATOR CUFF REPAIR;  Surgeon: Oliver Barre, MD;  Location: [facility];  Service: Orthopedics;  Laterality: Left;   BREATH TEK H PYLORI N/A 08/08/2014   Procedure: BREATH TEK H PYLORI;  Surgeon: Mariella Saa, MD;  Location: [facility];  Service: General;  Laterality: N/A;   COLONOSCOPY N/A 03/02/2013   Procedure: COLONOSCOPY;  Surgeon: Theda Belfast, MD;  Location: [facility];  Service: Endoscopy;  Laterality: N/A;   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   Procedure: LAPAROSCOPIC ROUX-EN-Y GASTRIC BYPASS WITH UPPER ENDOSCOPY;  Surgeon: Glenna Fellows, MD;  Location: [facility];  Service: General;  Laterality: N/A;   KNEE ARTHROSCOPY  2001   rt knee   OPEN SUBSCAPULARIS REPAIR  01/13/2023   Procedure: OPEN  SUBSCAPULARIS REPAIR;  Surgeon: Oliver Barre, MD;  Location: [facility];  Service: Orthopedics;;   SHOULDER ARTHROSCOPY WITH BICEPSTENOTOMY  01/13/2023   Procedure: SHOULDER ARTHROSCOPY WITH BICEPS TENOTOMY;  Surgeon: Oliver Barre, MD;  Location: [facility];  Service: Orthopedics;;   UPPER GI ENDOSCOPY  11/05/2014   Procedure: UPPER GI ENDOSCOPY;  Surgeon: Glenna Fellows, MD;  Location: [facility];  Service: General;;   Patient Active Problem List   Diagnosis Date Noted   History of colonic polyps 11/27/2021   Morbid obesity 04/01/2014   Hypercholesteremia    Hypertension    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    Metabolic syndrome     ONSET DATE: 01/13/23  REFERRING DIAG: s/p left arthroscopic rotator cuff repair of massive superior cuff tear, with open repair of subscapularis   THERAPY DIAG:  Acute pain of left shoulder  Stiffness of left shoulder, not elsewhere classified  Other symptoms and signs involving the musculoskeletal system  Rationale for Evaluation and Treatment: Rehabilitation  SUBJECTIVE:   SUBJECTIVE STATEMENT: S: "I feel like its moving more natural"  PERTINENT HISTORY: Pt is a 57 y/o male s/p s/p left arthroscopic rotator cuff repair of massive superior cuff tear, with open repair of subscapularis on 01/13/23. Pt noted to have a massive tear  and is on a conservative protocol.   PRECAUTIONS: Shoulder, see protocol for massive tear  WEIGHT BEARING RESTRICTIONS: Yes NWB  PAIN:  Are you having pain? No  FALLS: Has patient fallen in last 6 months? Yes. Number of falls 1  PLOF: Independent  PATIENT GOALS: To be able to use the LUE.   NEXT MD VISIT: 04/19/23  OBJECTIVE:   HAND DOMINANCE: Right  ADLs: Overall ADLs: Pt is having difficulty with sleeping,   FUNCTIONAL OUTCOME MEASURES: FOTO: 40/100  UPPER EXTREMITY ROM:     Assessed supine, er/IR adducted  Passive ROM Left eval Left 03/14/23  Shoulder flexion 128 145  Shoulder abduction 82  156  Shoulder internal rotation 90 90  Shoulder external rotation 52 63  (Blank rows = not tested)  Assessed seated, er/IR adducted  Active ROM Left eval Left 03/14/23  Shoulder flexion  103  Shoulder abduction  76  Shoulder internal rotation  90  Shoulder external rotation  38  (Blank rows = not tested)   UPPER EXTREMITY MMT:       Assessed seated, er/IR adducted  MMT Left eval Left 03/14/23  Shoulder flexion  3-/5  Shoulder abduction  3-/5  Shoulder internal rotation  3/5  Shoulder external rotation  3/5  (Blank rows = not tested)   EDEMA: Edema observed at left upper arm and anterior shoulder   OBSERVATIONS: Mod fascial restrictions noted in left upper arm, anterior shoulder, trapezius, and scapular regions   TODAY'S TREATMENT:                                                                                                                              DATE:   04/05/23 -A/ROM: standing- flexion, abduction, protraction, horizontal abduction, er/IR, x15 -Shoulder Strengthening: 2lb dumbbell, flexion, abduction, protraction, horizontal abduction, er/IR, x15 -Ball on the wall ABC's -Arms on Fire x60" -Shoulder Strengthening: green theraband, flexion, abduction, er/IR, protraction, horizontal abduction, x12 -Scapular Strengthening: green theraband, extension, rows, retraction, x12 -UBE: level 3, 3.5 KPH+, 3' forwards and backwards  04/01/23 -A/ROM: standing- flexion, abduction, protraction, horizontal abduction, er/IR, x10 -Shoulder Strengthening: 1lb dumbbell, flexion, abduction, protraction, horizontal abduction, er/IR, x10 -Arms on fire x70" -Loop Band Exercises: green band, wall taps, wall v ups, wall clocks, x10 -Ball on the [employer] -Overhead lacing  03/29/23 -A/ROM: standing- flexion, abduction, protraction, horizontal abduction, er/IR, x10 -Ball Rolls on the wall flexion, x10 -Therapy ball exercises: flexion, protraction, circles both directions, x10 -Ball  on the wall: ABC's -Shoulder Strengthening: 1lb dumbbell, flexion, abduction, protraction, horizontal abduction, er/IR, x10 - using mirror to limit shoulder hiking -functional reaching: 1.5lb wrist weight, 10 items from shoulder height shelf, 10 items from overhead shelf -Scapular theraband: green -row, extension, retraction, protraction 15 reps -UBE level 3, 3.0 KPH or higher, 3 mins forwards and backwards    PATIENT EDUCATION: Education details: Public relations account executive Person educated: Patient Education method: Explanation, Demonstration, and Handouts Education comprehension: verbalized understanding and returned  demonstration  HOME EXERCISE PROGRAM: Eval: table slides 3/4: Isometrics - flexion, abduction, internal rotation only 3/18: AA/ROM 3/21: Scapular Strengthening and wall slides 3/29: A/ROM 4/9: Functional Reaching in shelf height cabinet 4/12: Loop Band Exercises 4/17: Shoulder Strengthening  GOALS: Goals reviewed with patient? Yes  SHORT TERM GOALS: Target date: 03/17/23  Pt will be provided with and educated on HEP to improve mobility in LUE required for use during ADL completion.   Goal status: MET  2.  Pt will increase LUE P/ROM by 40 degrees or greater to improve ability to use LUE during dressing tasks with minimal compensatory techniques.   Goal status: IN PROGRESS  3.  Pt will increase LUE strength to 3+/5 to improve ability to reach for items at waist to chest height during bathing and grooming tasks.   Goal status: IN PROGRESS     LONG TERM GOALS: Target date: 04/18/23  Pt will decrease pain in LUE to 3/10 or less to improve ability to sleep for 2+ consecutive hours without waking due to pain.   Goal status: IN PROGRESS  2.  Pt will decrease LUE fascial restrictions to min amounts or less to improve mobility required for functional reaching tasks.   Goal status: IN PROGRESS  3.  Pt will increase LUE A/ROM to at least 145 for flexion/abduction,  and 50 degrees er/IR to improve ability to use LUE when reaching overhead or behind back during dressing and bathing tasks.   Goal status: IN PROGRESS  4.  Pt will increase LUE strength to 4+/5 or greater to improve ability to use LUE when lifting or carrying items during meal preparation/housework/yardwork tasks.   Goal status: IN PROGRESS  5.  Pt will return to highest level of function using LUE as non-dominant during functional task completion.   Goal status: IN PROGRESS  ASSESSMENT:  CLINICAL IMPRESSION: This session pt continuing to work on overall strengthening of his shoulder. OT providing verbal and tactile cuing with each exercise to limit shoulder hiking as a compensatory mechanism. Each session he is tolerating increasing strengthening and resistance with minimal pain and compensatory techniques. Additionally pt's endurance is improving, as he is requiring no rest breaks throughout session and reporting no increase in pain. Verbal and tactile cuing provided throughout for positioning and technique.    PERFORMANCE DEFICITS: in functional skills including ADLs, IADLs, ROM, strength, pain, fascial restrictions, and UE functional use   PLAN:  OT FREQUENCY: 2x/week  OT DURATION: 8 weeks  PLANNED INTERVENTIONS: self care/ADL training, therapeutic exercise, therapeutic activity, manual therapy, passive range of motion, splinting, electrical stimulation, ultrasound, moist heat, cryotherapy, patient/family education, and DME and/or AE instructions  CONSULTED AND AGREED WITH PLAN OF CARE: Patient  PLAN FOR NEXT SESSION: Continue protocol Phase III-manual techniques, P/ROM, AA/ROM progressing to A/ROM, scapular strengthening, Proximal exercises, shoulder strengthening with light dumbbells    Trish Mage, OTR/L [phone] 03/24/2023, 9:07 AM

## 2023-04-08 ENCOUNTER — Encounter (HOSPITAL_COMMUNITY): Payer: Self-pay | Admitting: Occupational Therapy

## 2023-04-08 ENCOUNTER — Ambulatory Visit (HOSPITAL_COMMUNITY): Payer: Federal, State, Local not specified - PPO | Admitting: Occupational Therapy

## 2023-04-08 DIAGNOSIS — M25512 Pain in left shoulder: Secondary | ICD-10-CM

## 2023-04-08 DIAGNOSIS — M25612 Stiffness of left shoulder, not elsewhere classified: Secondary | ICD-10-CM | POA: Diagnosis not present

## 2023-04-08 DIAGNOSIS — R29898 Other symptoms and signs involving the musculoskeletal system: Secondary | ICD-10-CM

## 2023-04-08 NOTE — Therapy (Signed)
OUTPATIENT OCCUPATIONAL THERAPY ORTHO TREATMENT NOTE  Patient Name: Brian Newton MRN: [id number] DOB:1966-08-21, 57 y.o., male Today's Date: 04/08/2023  PCP: Dr. Lynnea Ferrier  REFERRING PROVIDER: Dr. Thane Edu  END OF SESSION:  OT End of Session - 04/08/23 0736     Visit Number 16    Number of Visits 16    Date for OT Re-Evaluation 04/18/23    Authorization Type [employer]    Authorization Time Period 50 visit limit    Authorization - Visit Number 15    Authorization - Number of Visits 50    Progress Note Due on Visit 10    OT Start Time 0734    OT Stop Time 0815    OT Time Calculation (min) 41 min    Activity Tolerance Patient tolerated treatment well    Behavior During Therapy WFL for tasks assessed/performed              Past Medical History:  Diagnosis Date   Arthritis    Borderline hyperglycemia    Headache(784.0)    Hypercholesteremia    Hypertension    Metabolic syndrome    Shingles    Sleep apnea    uses C PAP   Swelling    both legs   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    Wrist pain    Past Surgical History:  Procedure Laterality Date   APPENDECTOMY  1986   ARTHOSCOPIC ROTAOR CUFF REPAIR Left 01/13/2023   Procedure: ARTHROSCOPIC ROTATOR CUFF REPAIR;  Surgeon: Oliver Barre, MD;  Location: [facility];  Service: Orthopedics;  Laterality: Left;   BREATH TEK H PYLORI N/A 08/08/2014   Procedure: BREATH TEK H PYLORI;  Surgeon: Mariella Saa, MD;  Location: [facility];  Service: General;  Laterality: N/A;   COLONOSCOPY N/A 03/02/2013   Procedure: COLONOSCOPY;  Surgeon: Theda Belfast, MD;  Location: [facility];  Service: Endoscopy;  Laterality: N/A;   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   Procedure: LAPAROSCOPIC ROUX-EN-Y GASTRIC BYPASS WITH UPPER ENDOSCOPY;  Surgeon: Glenna Fellows, MD;  Location: [facility];  Service: General;  Laterality: N/A;   KNEE ARTHROSCOPY  2001   rt knee   OPEN SUBSCAPULARIS REPAIR  01/13/2023   Procedure: OPEN  SUBSCAPULARIS REPAIR;  Surgeon: Oliver Barre, MD;  Location: [facility];  Service: Orthopedics;;   SHOULDER ARTHROSCOPY WITH BICEPSTENOTOMY  01/13/2023   Procedure: SHOULDER ARTHROSCOPY WITH BICEPS TENOTOMY;  Surgeon: Oliver Barre, MD;  Location: [facility];  Service: Orthopedics;;   UPPER GI ENDOSCOPY  11/05/2014   Procedure: UPPER GI ENDOSCOPY;  Surgeon: Glenna Fellows, MD;  Location: [facility];  Service: General;;   Patient Active Problem List   Diagnosis Date Noted   History of colonic polyps 11/27/2021   Morbid obesity 04/01/2014   Hypercholesteremia    Hypertension    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    Metabolic syndrome     ONSET DATE: 01/13/23  REFERRING DIAG: s/p left arthroscopic rotator cuff repair of massive superior cuff tear, with open repair of subscapularis   THERAPY DIAG:  Acute pain of left shoulder  Stiffness of left shoulder, not elsewhere classified  Other symptoms and signs involving the musculoskeletal system  Rationale for Evaluation and Treatment: Rehabilitation  SUBJECTIVE:   SUBJECTIVE STATEMENT: S: "I feel like its moving more natural"  PERTINENT HISTORY: Pt is a 57 y/o male s/p s/p left arthroscopic rotator cuff repair of massive superior cuff tear, with open repair of subscapularis on 01/13/23. Pt noted to have a massive tear  and is on a conservative protocol.   PRECAUTIONS: Shoulder, see protocol for massive tear  WEIGHT BEARING RESTRICTIONS: Yes NWB  PAIN:  Are you having pain? No  FALLS: Has patient fallen in last 6 months? Yes. Number of falls 1  PLOF: Independent  PATIENT GOALS: To be able to use the LUE.   NEXT MD VISIT: 04/19/23  OBJECTIVE:   HAND DOMINANCE: Right  ADLs: Overall ADLs: Pt is having difficulty with sleeping,   FUNCTIONAL OUTCOME MEASURES: FOTO: 40/100  UPPER EXTREMITY ROM:     Assessed supine, er/IR adducted  Passive ROM Left eval Left 03/14/23  Shoulder flexion 128 145  Shoulder abduction 82  156  Shoulder internal rotation 90 90  Shoulder external rotation 52 63  (Blank rows = not tested)  Assessed seated, er/IR adducted  Active ROM Left eval Left 03/14/23 Left 04/08/23  Shoulder flexion  103 153  Shoulder abduction  76 145  Shoulder internal rotation  90 90  Shoulder external rotation  38 53  (Blank rows = not tested)   UPPER EXTREMITY MMT:       Assessed seated, er/IR adducted  MMT Left eval Left 03/14/23 Left 04/08/23  Shoulder flexion  3-/5 3+/5  Shoulder abduction  3-/5 3+/5  Shoulder internal rotation  3/5 4+/5  Shoulder external rotation  3/5 4+/5  (Blank rows = not tested)   EDEMA: Edema observed at left upper arm and anterior shoulder   OBSERVATIONS: Mod fascial restrictions noted in left upper arm, anterior shoulder, trapezius, and scapular regions   TODAY'S TREATMENT:                                                                                                                              DATE:   04/08/23 -A/ROM: standing- flexion, abduction, protraction, horizontal abduction, er/IR, x15 -Stretching: flexion on the wall, Doorway stretch, er stretch, er behind the back, IR behind the head, Bicep stretch on the wall, latissimus stretch, 3x15" -Shoulder Strengthening: Blue theraband, flexion, abduction, er/IR, protraction, horizontal abduction, x12 -Scapular Strengthening: Blue theraband, extension, rows, retraction, x12 -Measurements for Reassessment  04/05/23 -A/ROM: standing- flexion, abduction, protraction, horizontal abduction, er/IR, x15 -Shoulder Strengthening: 2lb dumbbell, flexion, abduction, protraction, horizontal abduction, er/IR, x15 -Ball on the wall ABC's -Arms on Fire x60" -Shoulder Strengthening: green theraband, flexion, abduction, er/IR, protraction, horizontal abduction, x12 -Scapular Strengthening: green theraband, extension, rows, retraction, x12 -UBE: level 3, 3.5 KPH+, 3' forwards and backwards  04/01/23 -A/ROM:  standing- flexion, abduction, protraction, horizontal abduction, er/IR, x10 -Shoulder Strengthening: 1lb dumbbell, flexion, abduction, protraction, horizontal abduction, er/IR, x10 -Arms on fire x70" -Loop Band Exercises: green band, wall taps, wall v ups, wall clocks, x10 -Ball on the [employer] ABC's -Overhead lacing    PATIENT EDUCATION: Education details: Stretching Person educated: Patient Education method: Programmer, multimedia, Facilities manager, and Handouts Education comprehension: verbalized understanding and returned demonstration  HOME EXERCISE PROGRAM: Eval: table slides 3/4: Isometrics - flexion, abduction, internal rotation only 3/18: AA/ROM  3/21: Scapular Strengthening and wall slides 3/29: A/ROM 4/9: Functional Reaching in shelf height cabinet 4/12: Loop Band Exercises 4/17: Shoulder Strengthening 4/19: stretching  GOALS: Goals reviewed with patient? Yes  SHORT TERM GOALS: Target date: 03/17/23  Pt will be provided with and educated on HEP to improve mobility in LUE required for use during ADL completion.   Goal status: MET  2.  Pt will increase LUE P/ROM by 40 degrees or greater to improve ability to use LUE during dressing tasks with minimal compensatory techniques.   Goal status: MET  3.  Pt will increase LUE strength to 3+/5 to improve ability to reach for items at waist to chest height during bathing and grooming tasks.   Goal status: MET     LONG TERM GOALS: Target date: 04/18/23  Pt will decrease pain in LUE to 3/10 or less to improve ability to sleep for 2+ consecutive hours without waking due to pain.   Goal status: IN PROGRESS  2.  Pt will decrease LUE fascial restrictions to min amounts or less to improve mobility required for functional reaching tasks.   Goal status: IN PROGRESS  3.  Pt will increase LUE A/ROM to at least 145 for flexion/abduction, and 50 degrees er/IR to improve ability to use LUE when reaching overhead or behind back during dressing  and bathing tasks.   Goal status: MET  4.  Pt will increase LUE strength to 4+/5 or greater to improve ability to use LUE when lifting or carrying items during meal preparation/housework/yardwork tasks.   Goal status: IN PROGRESS  5.  Pt will return to highest level of function using LUE as non-dominant during functional task completion.   Goal status: IN PROGRESS  ASSESSMENT:  CLINICAL IMPRESSION: Pt presenting to this session for a reassessment. He is demonstrating good improvement with ROM and strength. Overall his ROM is approximately 75% of full ROM with no pain. Due to his progress and his good consistency with completing HEP OT is reducing therapy to 1 time per week for 4 more weeks. The remainder of the session focusing on adding stretches to improve ROM and overall strengthening. OT providing verbal and tactile cuing for positioning and technique throughout the session.    PERFORMANCE DEFICITS: in functional skills including ADLs, IADLs, ROM, strength, pain, fascial restrictions, and UE functional use   PLAN:  OT FREQUENCY: 1x/week  OT DURATION: 4 weeks  PLANNED INTERVENTIONS: self care/ADL training, therapeutic exercise, therapeutic activity, manual therapy, passive range of motion, splinting, electrical stimulation, ultrasound, moist heat, cryotherapy, patient/family education, and DME and/or AE instructions  CONSULTED AND AGREED WITH PLAN OF CARE: Patient  PLAN FOR NEXT SESSION: Continue protocol Phase III-manual techniques, P/ROM, AA/ROM progressing to A/ROM, scapular strengthening, Proximal exercises, shoulder strengthening with light dumbbells, Add PNF strengthening    Trish Mage, OTR/L [phone] 04/08/2023, 7:37 AM

## 2023-04-12 ENCOUNTER — Ambulatory Visit (HOSPITAL_COMMUNITY): Payer: Federal, State, Local not specified - PPO | Admitting: Occupational Therapy

## 2023-04-12 ENCOUNTER — Encounter (HOSPITAL_COMMUNITY): Payer: Self-pay | Admitting: Occupational Therapy

## 2023-04-12 DIAGNOSIS — M25512 Pain in left shoulder: Secondary | ICD-10-CM | POA: Diagnosis not present

## 2023-04-12 DIAGNOSIS — R29898 Other symptoms and signs involving the musculoskeletal system: Secondary | ICD-10-CM

## 2023-04-12 DIAGNOSIS — M25612 Stiffness of left shoulder, not elsewhere classified: Secondary | ICD-10-CM | POA: Diagnosis not present

## 2023-04-12 NOTE — Therapy (Signed)
OUTPATIENT OCCUPATIONAL THERAPY ORTHO TREATMENT NOTE  Patient Name: Brian Newton MRN: [id number] DOB:07-09-1966, 57 y.o., male Today's Date: 04/12/2023  PCP: Dr. Lynnea Ferrier  REFERRING PROVIDER: Dr. Thane Edu  END OF SESSION:  OT End of Session - 04/12/23 0904     Visit Number 17    Number of Visits 20    Date for OT Re-Evaluation 05/13/23    Authorization Type BC[employer]    Authorization Time Period 50 visit limit    Authorization - Visit Number 16    Authorization - Number of Visits 50    Progress Note Due on Visit 10    OT Start Time 0902    OT Stop Time 0945    OT Time Calculation (min) 43 min    Activity Tolerance Patient tolerated treatment well    Behavior During Therapy WFL for tasks assessed/performed              Past Medical History:  Diagnosis Date   Arthritis    Borderline hyperglycemia    Headache(784.0)    Hypercholesteremia    Hypertension    Metabolic syndrome    Shingles    Sleep apnea    uses C PAP   Swelling    both legs   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    Wrist pain    Past Surgical History:  Procedure Laterality Date   APPENDECTOMY  1986   ARTHOSCOPIC ROTAOR CUFF REPAIR Left 01/13/2023   Procedure: ARTHROSCOPIC ROTATOR CUFF REPAIR;  Surgeon: Oliver Barre, MD;  Location: [facility];  Service: Orthopedics;  Laterality: Left;   BREATH TEK H PYLORI N/A 08/08/2014   Procedure: BREATH TEK H PYLORI;  Surgeon: Mariella Saa, MD;  Location: [facility];  Service: General;  Laterality: N/A;   COLONOSCOPY N/A 03/02/2013   Procedure: COLONOSCOPY;  Surgeon: Theda Belfast, MD;  Location: [facility];  Service: Endoscopy;  Laterality: N/A;   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   Procedure: LAPAROSCOPIC ROUX-EN-Y GASTRIC BYPASS WITH UPPER ENDOSCOPY;  Surgeon: Glenna Fellows, MD;  Location: [facility];  Service: General;  Laterality: N/A;   KNEE ARTHROSCOPY  2001   rt knee   OPEN SUBSCAPULARIS REPAIR  01/13/2023   Procedure: OPEN  SUBSCAPULARIS REPAIR;  Surgeon: Oliver Barre, MD;  Location: [facility];  Service: Orthopedics;;   SHOULDER ARTHROSCOPY WITH BICEPSTENOTOMY  01/13/2023   Procedure: SHOULDER ARTHROSCOPY WITH BICEPS TENOTOMY;  Surgeon: Oliver Barre, MD;  Location: [facility];  Service: Orthopedics;;   UPPER GI ENDOSCOPY  11/05/2014   Procedure: UPPER GI ENDOSCOPY;  Surgeon: Glenna Fellows, MD;  Location: [facility];  Service: General;;   Patient Active Problem List   Diagnosis Date Noted   History of colonic polyps 11/27/2021   Morbid obesity 04/01/2014   Hypercholesteremia    Hypertension    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    Metabolic syndrome     ONSET DATE: 01/13/23  REFERRING DIAG: s/p left arthroscopic rotator cuff repair of massive superior cuff tear, with open repair of subscapularis   THERAPY DIAG:  Acute pain of left shoulder  Stiffness of left shoulder, not elsewhere classified  Other symptoms and signs involving the musculoskeletal system  Rationale for Evaluation and Treatment: Rehabilitation  SUBJECTIVE:   SUBJECTIVE STATEMENT: S: "I felt like I pulled something in my shoulder over the weekend and I could not move it at all"  PERTINENT HISTORY: Pt is a 57 y/o male s/p s/p left arthroscopic rotator cuff repair of massive superior cuff tear, with  open repair of subscapularis on 01/13/23. Pt noted to have a massive tear and is on a conservative protocol.   PRECAUTIONS: Shoulder, see protocol for massive tear  WEIGHT BEARING RESTRICTIONS: Yes NWB  PAIN:  Are you having pain? No  FALLS: Has patient fallen in last 6 months? Yes. Number of falls 1  PLOF: Independent  PATIENT GOALS: To be able to use the LUE.   NEXT MD VISIT: 04/19/23  OBJECTIVE:   HAND DOMINANCE: Right  ADLs: Overall ADLs: Pt is having difficulty with sleeping,   FUNCTIONAL OUTCOME MEASURES: FOTO: 40/100  UPPER EXTREMITY ROM:     Assessed supine, er/IR adducted  Passive ROM Left eval  Left 03/14/23  Shoulder flexion 128 145  Shoulder abduction 82 156  Shoulder internal rotation 90 90  Shoulder external rotation 52 63  (Blank rows = not tested)  Assessed seated, er/IR adducted  Active ROM Left eval Left 03/14/23 Left 04/08/23  Shoulder flexion  103 153  Shoulder abduction  76 145  Shoulder internal rotation  90 90  Shoulder external rotation  38 53  (Blank rows = not tested)   UPPER EXTREMITY MMT:       Assessed seated, er/IR adducted  MMT Left eval Left 03/14/23 Left 04/08/23  Shoulder flexion  3-/5 3+/5  Shoulder abduction  3-/5 3+/5  Shoulder internal rotation  3/5 4+/5  Shoulder external rotation  3/5 4+/5  (Blank rows = not tested)   EDEMA: Edema observed at left upper arm and anterior shoulder   OBSERVATIONS: Mod fascial restrictions noted in left upper arm, anterior shoulder, trapezius, and scapular regions   TODAY'S TREATMENT:                                                                                                                              DATE:  04/12/23 -A/ROM: standing- flexion, abduction, protraction, horizontal abduction, er/IR, x15 -Stretching: flexion on the wall, Doorway stretch, er stretch, er behind the back, IR behind the head, Bicep stretch on the wall, latissimus stretch, 3x15" -X to V arms, 2lb dumbbells, x10 -Goal post arms, 2lb dumbbells, x10 -Shoulder Strengthening: 2lb dumbbell, flexion, abduction, protraction, horizontal abduction, er/IR, x15 -Therapy Ball Exercises: blue weighted ball, chest press, flexion, overhead press, V ups, circles both directions, x10 -Scapular Strengthening: Blue theraband, extension, rows, retraction, x15 -Shoulder strengthening: Blue Theraband, horizontal abduction, er, IR, abduction, flexion, x15 -UBE, level 4, 5.0 KPH+, 3' forwards and backwards  04/08/23 -A/ROM: standing- flexion, abduction, protraction, horizontal abduction, er/IR, x15 -Stretching: flexion on the wall, Doorway  stretch, er stretch, er behind the back, IR behind the head, Bicep stretch on the wall, latissimus stretch, 3x15" -Shoulder Strengthening: Blue theraband, flexion, abduction, er/IR, protraction, horizontal abduction, x12 -Scapular Strengthening: Blue theraband, extension, rows, retraction, x12 -Measurements for Reassessment  04/05/23 -A/ROM: standing- flexion, abduction, protraction, horizontal abduction, er/IR, x15 -Shoulder Strengthening: 2lb dumbbell, flexion, abduction, protraction, horizontal abduction, er/IR, x15 -Ball on the wall ABC's -Arms on  Fire x60" -Shoulder Strengthening: green theraband, flexion, abduction, er/IR, protraction, horizontal abduction, x12 -Scapular Strengthening: green theraband, extension, rows, retraction, x12 -UBE: level 3, 3.5 KPH+, 3' forwards and backwards    PATIENT EDUCATION: Education details: Therapy Ball Exercises Person educated: Patient Education method: Explanation, Demonstration, and Handouts Education comprehension: verbalized understanding and returned demonstration  HOME EXERCISE PROGRAM: Eval: table slides 3/4: Isometrics - flexion, abduction, internal rotation only 3/18: AA/ROM 3/21: Scapular Strengthening and wall slides 3/29: A/ROM 4/9: Functional Reaching in shelf height cabinet 4/12: Loop Band Exercises 4/17: Shoulder Strengthening 4/19: stretching 4/23: Therapy Ball Exercises  GOALS: Goals reviewed with patient? Yes  SHORT TERM GOALS: Target date: 03/17/23  Pt will be provided with and educated on HEP to improve mobility in LUE required for use during ADL completion.   Goal status: MET  2.  Pt will increase LUE P/ROM by 40 degrees or greater to improve ability to use LUE during dressing tasks with minimal compensatory techniques.   Goal status: MET  3.  Pt will increase LUE strength to 3+/5 to improve ability to reach for items at waist to chest height during bathing and grooming tasks.   Goal status:  MET     LONG TERM GOALS: Target date: 04/18/23  Pt will decrease pain in LUE to 3/10 or less to improve ability to sleep for 2+ consecutive hours without waking due to pain.   Goal status: IN PROGRESS  2.  Pt will decrease LUE fascial restrictions to min amounts or less to improve mobility required for functional reaching tasks.   Goal status: IN PROGRESS  3.  Pt will increase LUE A/ROM to at least 145 for flexion/abduction, and 50 degrees er/IR to improve ability to use LUE when reaching overhead or behind back during dressing and bathing tasks.   Goal status: MET  4.  Pt will increase LUE strength to 4+/5 or greater to improve ability to use LUE when lifting or carrying items during meal preparation/housework/yardwork tasks.   Goal status: IN PROGRESS  5.  Pt will return to highest level of function using LUE as non-dominant during functional task completion.   Goal status: IN PROGRESS  ASSESSMENT:  CLINICAL IMPRESSION: This session pt reported having a "bad weekend" where he felt like he pulled something loose and was unable to move his LUE like he had been. This session he did fatigue quicker than previous sessions, however he reported that pain was back to 0, his arm just felt tired. OT added therapy ball exercises this session to continue improving his strength. Throughout session OT providing verbal and tactile cuing for positioning and technique, as well as limiting shoulder hiking.    PERFORMANCE DEFICITS: in functional skills including ADLs, IADLs, ROM, strength, pain, fascial restrictions, and UE functional use   PLAN:  OT FREQUENCY: 1x/week  OT DURATION: 4 weeks  PLANNED INTERVENTIONS: self care/ADL training, therapeutic exercise, therapeutic activity, manual therapy, passive range of motion, splinting, electrical stimulation, ultrasound, moist heat, cryotherapy, patient/family education, and DME and/or AE instructions  CONSULTED AND AGREED WITH PLAN OF CARE:  Patient  PLAN FOR NEXT SESSION: Continue protocol Phase III-manual techniques, P/ROM, AA/ROM progressing to A/ROM, scapular strengthening, Proximal exercises, shoulder strengthening with light dumbbells, Add PNF strengthening    Trish Mage, OTR/L [phone] 04/12/2023, 9:06 AM

## 2023-04-12 NOTE — Patient Instructions (Signed)
Therapy Ball Strengthening Exercises: Complete all exercises 10-15X each, 2x/day.   1) Overhead press: Hold a medicine ball or other ball at your chest. Next, extend your elbows and push the ball over your head as shown. Return to starting position and repeat.     2) Chest press: Hold a medicine ball or other ball at your chest. Next, extend your elbows and push the ball outward in front of your body as shown. Return to starting position and repeat.     3) Ball circles:  Hold a medicine ball or other ball at your chest. Next, extend your elbows and push the ball outward in front of your body as shown. Then, move the ball in a circular pattern for several revolutions and then reverse the direction.     4) Flexion: Start holding an exercise ball out in front of your body with elbows extended. Then, slowly lift the ball up over head. Return the ball to starting position and repeat.     5) Ball diagonals: Begin holding a ball with both hands and then move it through a diagonal pattern from your hip to over your opposite shoulder as shown.    

## 2023-04-15 ENCOUNTER — Encounter (HOSPITAL_COMMUNITY): Payer: Federal, State, Local not specified - PPO | Admitting: Occupational Therapy

## 2023-04-18 ENCOUNTER — Ambulatory Visit (HOSPITAL_COMMUNITY): Payer: Federal, State, Local not specified - PPO | Admitting: Occupational Therapy

## 2023-04-18 ENCOUNTER — Encounter (HOSPITAL_COMMUNITY): Payer: Self-pay | Admitting: Occupational Therapy

## 2023-04-18 DIAGNOSIS — M25612 Stiffness of left shoulder, not elsewhere classified: Secondary | ICD-10-CM | POA: Diagnosis not present

## 2023-04-18 DIAGNOSIS — M25512 Pain in left shoulder: Secondary | ICD-10-CM

## 2023-04-18 DIAGNOSIS — R29898 Other symptoms and signs involving the musculoskeletal system: Secondary | ICD-10-CM | POA: Diagnosis not present

## 2023-04-18 NOTE — Therapy (Unsigned)
OUTPATIENT OCCUPATIONAL THERAPY ORTHO TREATMENT NOTE  Patient Name: Brian Newton MRN: [id number] DOB:Feb 01, 1966, 57 y.o., male Today's Date: 04/18/2023  PCP: Dr. Lynnea Ferrier  REFERRING PROVIDER: Dr. Thane Edu  END OF SESSION:  OT End of Session - 04/18/23 0900     Visit Number 18    Number of Visits 20    Date for OT Re-Evaluation 05/13/23    Authorization Type [employer]    Authorization Time Period 50 visit limit    Authorization - Visit Number 17    Authorization - Number of Visits 50    Progress Note Due on Visit 10    OT Start Time 0900    OT Stop Time 0940    OT Time Calculation (min) 40 min    Activity Tolerance Patient tolerated treatment well    Behavior During Therapy WFL for tasks assessed/performed              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)    Wrist pain    Past Surgical History:  Procedure Laterality Date   APPENDECTOMY  1986   ARTHOSCOPIC ROTAOR CUFF REPAIR Left 01/13/2023   Procedure: ARTHROSCOPIC ROTATOR CUFF REPAIR;  Surgeon: Oliver Barre, MD;  Location: [facility];  Service: Orthopedics;  Laterality: Left;   BREATH TEK H PYLORI N/A 08/08/2014   Procedure: BREATH TEK H PYLORI;  Surgeon: Mariella Saa, MD;  Location: [facility];  Service: General;  Laterality: N/A;   COLONOSCOPY N/A 03/02/2013   Procedure: COLONOSCOPY;  Surgeon: Theda Belfast, MD;  Location: [facility];  Service: Endoscopy;  Laterality: N/A;   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   Procedure: LAPAROSCOPIC ROUX-EN-Y GASTRIC BYPASS WITH UPPER ENDOSCOPY;  Surgeon: Glenna Fellows, MD;  Location: [facility];  Service: General;  Laterality: N/A;   KNEE ARTHROSCOPY  2001   rt knee   OPEN SUBSCAPULARIS REPAIR  01/13/2023   Procedure:  OPEN SUBSCAPULARIS REPAIR;  Surgeon: Oliver Barre, MD;  Location: [facility];  Service: Orthopedics;;   SHOULDER ARTHROSCOPY WITH BICEPSTENOTOMY  01/13/2023   Procedure: SHOULDER ARTHROSCOPY WITH BICEPS TENOTOMY;  Surgeon: Oliver Barre, MD;  Location: [facility];  Service: Orthopedics;;   UPPER GI ENDOSCOPY  11/05/2014   Procedure: UPPER GI ENDOSCOPY;  Surgeon: Glenna Fellows, MD;  Location: [facility];  Service: General;;   Patient Active Problem List   Diagnosis Date Noted   History of colonic polyps 11/27/2021   Morbid obesity (HCC) 04/01/2014   Hypercholesteremia    Hypertension    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)    Metabolic syndrome     ONSET DATE: 01/13/23  REFERRING DIAG: s/p left arthroscopic rotator cuff repair of massive superior cuff tear, with open repair of subscapularis   THERAPY DIAG:  Acute pain of left shoulder  Stiffness of left shoulder, not elsewhere classified  Other symptoms and signs involving the musculoskeletal system  Rationale for Evaluation and Treatment: Rehabilitation  SUBJECTIVE:   SUBJECTIVE STATEMENT: S: "I did some landscaping 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  Pt noted to have a massive tear and is on a conservative protocol.   PRECAUTIONS: Shoulder, see protocol for massive tear  WEIGHT BEARING RESTRICTIONS: Yes NWB  PAIN:  Are you having pain? No  FALLS: Has patient fallen in last 6 months? Yes. Number of falls 1  PLOF: Independent  PATIENT GOALS: To be able to use the LUE.   NEXT MD VISIT: 04/19/23  OBJECTIVE:   HAND DOMINANCE: Right  ADLs: Overall ADLs: Pt is having difficulty with sleeping,   FUNCTIONAL OUTCOME MEASURES: FOTO: 40/100 4/29: 74.42/100  UPPER EXTREMITY ROM:     Assessed supine, er/IR adducted  Passive ROM Left eval  Left 03/14/23  Shoulder flexion 128 145  Shoulder abduction 82 156  Shoulder internal rotation 90 90  Shoulder external rotation 52 63  (Blank rows = not tested)  Assessed seated, er/IR adducted  Active ROM Left eval Left 03/14/23 Left 04/08/23  Shoulder flexion  103 153  Shoulder abduction  76 145  Shoulder internal rotation  90 90  Shoulder external rotation  38 53  (Blank rows = not tested)   UPPER EXTREMITY MMT:       Assessed seated, er/IR adducted  MMT Left eval Left 03/14/23 Left 04/08/23  Shoulder flexion  3-/5 3+/5  Shoulder abduction  3-/5 3+/5  Shoulder internal rotation  3/5 4+/5  Shoulder external rotation  3/5 4+/5  (Blank rows = not tested)   EDEMA: Edema observed at left upper arm and anterior shoulder   OBSERVATIONS: Mod fascial restrictions noted in left upper arm, anterior shoulder, trapezius, and scapular regions   TODAY'S TREATMENT:                                                                                                                              DATE:  04/18/23 -Shoulder Strengthening: 2lb dumbbell, flexion, abduction, protraction, horizontal abduction, er/IR, x15 -X to V arms, 2lb dumbbells, x15 -Goal post arms, 2lb dumbbells, x15 -PNF Strength: Green theraband, chest pulls, er pulls, PNF up, PNF down, x15 -[employer]: 3lbs at 90 degrees flexion, 90" -Functional reaching: 3lb wrist weight donned, 10 cones from counter to 1st shelf, then first shelf to 2nd shelf in flexion, then taking them down in the same order in abduction, tapping 3rd shelf x10 in flexion and x10 in abduction -[facility] on the wall: flexion, abduction, x15  04/12/23 -A/ROM: standing- flexion, abduction, protraction, horizontal abduction, er/IR, x15 -Stretching: flexion on the wall, Doorway stretch, er stretch, er behind the back, IR behind the head, Bicep stretch on the wall, latissimus stretch, 3x15" -X to V arms, 2lb dumbbells, x10 -Goal post arms, 2lb  dumbbells, x10 -Shoulder Strengthening: 2lb dumbbell, flexion, abduction, protraction, horizontal abduction, er/IR, x15 -Therapy Ball Exercises: blue weighted ball, chest press, flexion, overhead press, V ups, circles both directions, x10 -Scapular Strengthening: Blue theraband, extension, rows, retraction, x15 -Shoulder strengthening: Blue Theraband, horizontal abduction, er, IR, abduction, flexion, x15 -UBE, level 4, 5.0  KPH+, 3' forwards and backwards  04/08/23 -A/ROM: standing- flexion, abduction, protraction, horizontal abduction, er/IR, x15 -Stretching: flexion on the wall, Doorway stretch, er stretch, er behind the back, IR behind the head, Bicep stretch on the wall, latissimus stretch, 3x15" -Shoulder Strengthening: Blue theraband, flexion, abduction, er/IR, protraction, horizontal abduction, x12 -Scapular Strengthening: Blue theraband, extension, rows, retraction, x12 -Measurements for Reassessment   PATIENT EDUCATION: Education details: PNF Strengthening Person educated: Patient Education method: Programmer, multimedia, Demonstration, and Handouts Education comprehension: verbalized understanding and returned demonstration  HOME EXERCISE PROGRAM: Eval: table slides 3/4: Isometrics - flexion, abduction, internal rotation only 3/18: AA/ROM 3/21: Scapular Strengthening and wall slides 3/29: A/ROM 4/9: Functional Reaching in shelf height cabinet 4/12: Loop Band Exercises 4/17: Shoulder Strengthening 4/19: stretching 4/23: Therapy Ball Exercises 4/29: PNF Strengthening  GOALS: Goals reviewed with patient? Yes  SHORT TERM GOALS: Target date: 03/17/23  Pt will be provided with and educated on HEP to improve mobility in LUE required for use during ADL completion.   Goal status: MET  2.  Pt will increase LUE P/ROM by 40 degrees or greater to improve ability to use LUE during dressing tasks with minimal compensatory techniques.   Goal status: MET  3.  Pt will increase LUE strength  to 3+/5 to improve ability to reach for items at waist to chest height during bathing and grooming tasks.   Goal status: MET     LONG TERM GOALS: Target date: 04/18/23  Pt will decrease pain in LUE to 3/10 or less to improve ability to sleep for 2+ consecutive hours without waking due to pain.   Goal status: IN PROGRESS  2.  Pt will decrease LUE fascial restrictions to min amounts or less to improve mobility required for functional reaching tasks.   Goal status: IN PROGRESS  3.  Pt will increase LUE A/ROM to at least 145 for flexion/abduction, and 50 degrees er/IR to improve ability to use LUE when reaching overhead or behind back during dressing and bathing tasks.   Goal status: MET  4.  Pt will increase LUE strength to 4+/5 or greater to improve ability to use LUE when lifting or carrying items during meal preparation/housework/yardwork tasks.   Goal status: IN PROGRESS  5.  Pt will return to highest level of function using LUE as non-dominant during functional task completion.   Goal status: IN PROGRESS  ASSESSMENT:  CLINICAL IMPRESSION: This session pt reported having a "bad weekend" where he felt like he pulled something loose and was unable to move his LUE like he had been. This session he did fatigue quicker than previous sessions, however he reported that pain was back to 0, his arm just felt tired. OT added therapy ball exercises this session to continue improving his strength. Throughout session OT providing verbal and tactile cuing for positioning and technique, as well as limiting shoulder hiking.    PERFORMANCE DEFICITS: in functional skills including ADLs, IADLs, ROM, strength, pain, fascial restrictions, and UE functional use   PLAN:  OT FREQUENCY: 1x/week  OT DURATION: 4 weeks  PLANNED INTERVENTIONS: self care/ADL training, therapeutic exercise, therapeutic activity, manual therapy, passive range of motion, splinting, electrical stimulation, ultrasound, moist  heat, cryotherapy, patient/family education, and DME and/or AE instructions  CONSULTED AND AGREED WITH PLAN OF CARE: Patient  PLAN FOR NEXT SESSION: Continue protocol Phase III-manual techniques, P/ROM, AA/ROM progressing to A/ROM, scapular strengthening, Proximal exercises, shoulder strengthening with light dumbbells, Add PNF strengthening    Trish Mage, OTR/L [phone] 04/18/2023, 9:01 AM

## 2023-04-18 NOTE — Patient Instructions (Signed)

## 2023-04-19 ENCOUNTER — Ambulatory Visit (INDEPENDENT_AMBULATORY_CARE_PROVIDER_SITE_OTHER): Payer: Federal, State, Local not specified - PPO | Admitting: Orthopedic Surgery

## 2023-04-19 ENCOUNTER — Encounter: Payer: Self-pay | Admitting: Orthopedic Surgery

## 2023-04-19 DIAGNOSIS — S46012D Strain of muscle(s) and tendon(s) of the rotator cuff of left shoulder, subsequent encounter: Secondary | ICD-10-CM

## 2023-04-19 NOTE — Progress Notes (Signed)
Orthopaedic Postop Note  Assessment: Brian Newton is a 57 y.o. male s/p left arthroscopic rotator cuff repair of massive superior cuff tear, with open repair of subscapularis  DOS: 01/13/2023  Plan: Mr. Heinzman is doing great.  He has no pain.  He has near full range of motion.  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  He is more active around the house.  I am very pleased with his progress thus far.  I am very pleased with his progress thus far.  I shared this with the patient.  Encouraged him to work diligently on strengthening.  Reminded him it is at least a 89-month recovery from his injury.  He states understanding.  He will continue with medications as needed.  I will see him back in approximately 3 months.   Follow-up: Return in about 3 months (around 07/19/2023). XR at next visit: None  Subjective:  Chief Complaint  Patient presents with   Routine Post Op    L RCR DOS 01/13/23    History of Present Illness: Brian Newton is a 57 y.o. male who presents following the above stated procedure.  Surgery was approximately 3 months ago.  He has progressed well with physical therapy.  He is now starting to work on Print production planner.  He is working with a therapist once weekly, and doing exercises on his own.  He started doing more work around the house.  He denies pain in the left shoulder.  Occasionally, he will note some stiffness.  He is pleased with his improvements.   Review of Systems: No fevers or chills No numbness or tingling No Chest Pain No shortness of breath   Objective: There were no vitals taken for this visit.  Physical Exam:  Surgical incisions are healed.  No surrounding erythema or drainage.  Sensation intact throughout the left upper extremity.  170 degrees of active forward flexion.  Internal rotation to lumbar spine.  Negative belly press.  Strength testing deferred otherwise.  Fingers warm and well-perfused.  IMAGING: I personally ordered and reviewed  the following images:  No new imaging obtained today.   Oliver Barre, MD 04/19/2023 2:20 PM

## 2023-04-26 ENCOUNTER — Other Ambulatory Visit: Payer: Self-pay | Admitting: Family Medicine

## 2023-04-26 MED ORDER — CYCLOBENZAPRINE HCL 10 MG PO TABS: 10.0000 mg | ORAL_TABLET | Freq: Three times a day (TID) | ORAL | 0 refills | Status: DC | PRN

## 2023-04-28 ENCOUNTER — Ambulatory Visit (HOSPITAL_COMMUNITY): Payer: Federal, State, Local not specified - PPO | Attending: Orthopedic Surgery | Admitting: Occupational Therapy

## 2023-04-28 ENCOUNTER — Encounter (HOSPITAL_COMMUNITY): Payer: Self-pay | Admitting: Occupational Therapy

## 2023-04-28 DIAGNOSIS — M25512 Pain in left shoulder: Secondary | ICD-10-CM | POA: Insufficient documentation

## 2023-04-28 DIAGNOSIS — M25612 Stiffness of left shoulder, not elsewhere classified: Secondary | ICD-10-CM | POA: Diagnosis not present

## 2023-04-28 DIAGNOSIS — R29898 Other symptoms and signs involving the musculoskeletal system: Secondary | ICD-10-CM | POA: Diagnosis not present

## 2023-04-28 NOTE — Therapy (Signed)
OUTPATIENT OCCUPATIONAL THERAPY ORTHO TREATMENT NOTE  Patient Name: Brian Newton MRN: [id number] DOB:12/15/1966, 57 y.o., male Today's Date: 04/28/2023  PCP: Dr. Lynnea Ferrier  REFERRING PROVIDER: Dr. Thane Edu  END OF SESSION:  OT End of Session - 04/28/23 0901     Visit Number 19    Number of Visits 20    Date for OT Re-Evaluation 05/13/23    Authorization Type BCBS    Authorization Time Period 50 visit limit    Authorization - Visit Number 18    Authorization - Number of Visits 50    Progress Note Due on Visit 10    Activity Tolerance Patient tolerated treatment well    Behavior During Therapy WFL for tasks assessed/performed              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)    Wrist pain    Past Surgical History:  Procedure Laterality Date   APPENDECTOMY  1986   ARTHOSCOPIC ROTAOR CUFF REPAIR Left 01/13/2023   Procedure: ARTHROSCOPIC ROTATOR CUFF REPAIR;  Surgeon: Oliver Barre, MD;  Location: [facility];  Service: Orthopedics;  Laterality: Left;   BREATH TEK H PYLORI N/A 08/08/2014   Procedure: BREATH TEK H PYLORI;  Surgeon: Mariella Saa, MD;  Location: [facility];  Service: General;  Laterality: N/A;   COLONOSCOPY N/A 03/02/2013   Procedure: COLONOSCOPY;  Surgeon: Theda Belfast, MD;  Location: [facility];  Service: Endoscopy;  Laterality: N/A;   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   Procedure: LAPAROSCOPIC ROUX-EN-Y GASTRIC BYPASS WITH UPPER ENDOSCOPY;  Surgeon: Glenna Fellows, MD;  Location: [facility];  Service: General;  Laterality: N/A;   KNEE ARTHROSCOPY  2001   rt knee   OPEN SUBSCAPULARIS REPAIR  01/13/2023   Procedure: OPEN SUBSCAPULARIS REPAIR;  Surgeon: Oliver Barre, MD;  Location: [facility];   Service: Orthopedics;;   SHOULDER ARTHROSCOPY WITH BICEPSTENOTOMY  01/13/2023   Procedure: SHOULDER ARTHROSCOPY WITH BICEPS TENOTOMY;  Surgeon: Oliver Barre, MD;  Location: [facility];  Service: Orthopedics;;   UPPER GI ENDOSCOPY  11/05/2014   Procedure: UPPER GI ENDOSCOPY;  Surgeon: Glenna Fellows, MD;  Location: [facility];  Service: General;;   Patient Active Problem List   Diagnosis Date Noted   History of colonic polyps 11/27/2021   Morbid obesity (HCC) 04/01/2014   Hypercholesteremia    Hypertension    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)    Metabolic syndrome     ONSET DATE: 01/13/23  REFERRING DIAG: s/p left arthroscopic rotator cuff repair of massive superior cuff tear, with open repair of subscapularis   THERAPY DIAG:  Acute pain of left shoulder  Stiffness of left shoulder, not elsewhere classified  Other symptoms and signs involving the musculoskeletal system  Rationale for Evaluation and Treatment: Rehabilitation  SUBJECTIVE:   SUBJECTIVE STATEMENT: S: "The doctor said it looks good, it'll just take some time to get my shoulder strength up."  PERTINENT HISTORY: Pt is a 58 y/o male s/p s/p left arthroscopic rotator cuff repair of massive superior cuff tear, with open repair of subscapularis on 01/13/23. Pt noted to have a massive tear and is on a conservative protocol.   PRECAUTIONS: Shoulder,  see protocol for massive tear  WEIGHT BEARING RESTRICTIONS: Yes 5-10lbs  PAIN:  Are you having pain? No  FALLS: Has patient fallen in last 6 months? Yes. Number of falls 1  PLOF: Independent  PATIENT GOALS: To be able to use the LUE.   NEXT MD VISIT: ~6weeks  OBJECTIVE:   HAND DOMINANCE: Right  ADLs: Overall ADLs: Pt is having difficulty with sleeping,   FUNCTIONAL OUTCOME MEASURES: FOTO: 40/100 4/29: 74.42/100  UPPER EXTREMITY ROM:     Assessed supine, er/IR adducted  Passive ROM Left eval Left 03/14/23  Shoulder flexion 128 145   Shoulder abduction 82 156  Shoulder internal rotation 90 90  Shoulder external rotation 52 63  (Blank rows = not tested)  Assessed seated, er/IR adducted  Active ROM Left eval Left 03/14/23 Left 04/08/23  Shoulder flexion  103 153  Shoulder abduction  76 145  Shoulder internal rotation  90 90  Shoulder external rotation  38 53  (Blank rows = not tested)   UPPER EXTREMITY MMT:       Assessed seated, er/IR adducted  MMT Left eval Left 03/14/23 Left 04/08/23  Shoulder flexion  3-/5 3+/5  Shoulder abduction  3-/5 3+/5  Shoulder internal rotation  3/5 4+/5  Shoulder external rotation  3/5 4+/5  (Blank rows = not tested)   EDEMA: Edema observed at left upper arm and anterior shoulder   OBSERVATIONS: Mod fascial restrictions noted in left upper arm, anterior shoulder, trapezius, and scapular regions   TODAY'S TREATMENT:                                                                                                                              DATE:  04/28/23 -[name] Rolls on the Wall: flexion, abduction, x15 -Therapy ball exercises: flexion, overhead press, chest press, V ups, circles both directions, x15 -Shoulder strengthening: blue theraband, flexion, abduction, er, IR, horizontal abduction, x15 -Scapular strengthening: blue theraband, extension, retraction, protraction, rows, x15 -Overhead lacing, 2lb wrist weight donned -Functional reaching: 2lb wrist weight donned : 2- 1lbs, 2- 2lbs, 2- 3lbs, 2- 4lbs, from counter to shoulder height shelf, shoulder height  to overhead shelf, then reversing down -Farmers Carry: 4lbs at 90 degrees flexion, 90"  04/18/23 -Shoulder Strengthening: 2lb dumbbell, flexion, abduction, protraction, horizontal abduction, er/IR, x15 -X to V arms, 2lb dumbbells, x15 -Goal post arms, 2lb dumbbells, x15 -PNF Strength: Green theraband, chest pulls, er pulls, PNF up, PNF down, x15 -Farmers Carry: 3lbs at 90 degrees flexion, 90" -Functional reaching:  3lb wrist weight donned, 10 cones from counter to 1st shelf, then first shelf to 2nd shelf in flexion, then taking them down in the same order in abduction, tapping 3rd shelf x10 in flexion and x10 in abduction -[facility] on the wall: flexion, abduction, x15  04/12/23 -A/ROM: standing- flexion, abduction, protraction, horizontal abduction, er/IR, x15 -Stretching: flexion on the wall, Doorway stretch, er stretch, er behind the back, IR behind the head, Bicep  stretch on the wall, latissimus stretch, 3x15" -X to V arms, 2lb dumbbells, x10 -Goal post arms, 2lb dumbbells, x10 -Shoulder Strengthening: 2lb dumbbell, flexion, abduction, protraction, horizontal abduction, er/IR, x15 -Therapy Ball Exercises: blue weighted ball, chest press, flexion, overhead press, V ups, circles both directions, x10 -Scapular Strengthening: Blue theraband, extension, rows, retraction, x15 -Shoulder strengthening: Blue Theraband, horizontal abduction, er, IR, abduction, flexion, x15 -UBE, level 4, 5.0 KPH+, 3' forwards and backwards    PATIENT EDUCATION: Education details: Review HEP Person educated: Patient Education method: Explanation, Demonstration, and Handouts Education comprehension: verbalized understanding and returned demonstration  HOME EXERCISE PROGRAM: Eval: table slides 3/4: Isometrics - flexion, abduction, internal rotation only 3/18: AA/ROM 3/21: Scapular Strengthening and wall slides 3/29: A/ROM 4/9: Functional Reaching in shelf height cabinet 4/12: Loop Band Exercises 4/17: Shoulder Strengthening 4/19: stretching 4/23: Therapy Ball Exercises 4/29: PNF Strengthening  GOALS: Goals reviewed with patient? Yes  SHORT TERM GOALS: Target date: 03/17/23  Pt will be provided with and educated on HEP to improve mobility in LUE required for use during ADL completion.   Goal status: MET  2.  Pt will increase LUE P/ROM by 40 degrees or greater to improve ability to use LUE during dressing tasks  with minimal compensatory techniques.   Goal status: MET  3.  Pt will increase LUE strength to 3+/5 to improve ability to reach for items at waist to chest height during bathing and grooming tasks.   Goal status: MET     LONG TERM GOALS: Target date: 04/18/23  Pt will decrease pain in LUE to 3/10 or less to improve ability to sleep for 2+ consecutive hours without waking due to pain.   Goal status: IN PROGRESS  2.  Pt will decrease LUE fascial restrictions to min amounts or less to improve mobility required for functional reaching tasks.   Goal status: IN PROGRESS  3.  Pt will increase LUE A/ROM to at least 145 for flexion/abduction, and 50 degrees er/IR to improve ability to use LUE when reaching overhead or behind back during dressing and bathing tasks.   Goal status: MET  4.  Pt will increase LUE strength to 4+/5 or greater to improve ability to use LUE when lifting or carrying items during meal preparation/housework/yardwork tasks.   Goal status: IN PROGRESS  5.  Pt will return to highest level of function using LUE as non-dominant during functional task completion.   Goal status: IN PROGRESS  ASSESSMENT:  CLINICAL IMPRESSION: Pt having increasing strength with 90% full ROM. As he fatigues throughout the session, pt demonstrating decreasing ROM, however no less than 75% of full ROM. OT upgraded him to blue theraband this session, along with increasing him to 4lb dumbbells for the farmers carry. He continues to have the most weakness noted around the deltoid, making abduction difficult, as well as mild difficulty with horizontal abduction. OT providing minimal verbal and tactile cuing for positioning and technique, along with cuing for decreasing shoulder hiking.    PERFORMANCE DEFICITS: in functional skills including ADLs, IADLs, ROM, strength, pain, fascial restrictions, and UE functional use   PLAN:  OT FREQUENCY: 1x/week  OT DURATION: 4 weeks  PLANNED  INTERVENTIONS: self care/ADL training, therapeutic exercise, therapeutic activity, manual therapy, passive range of motion, splinting, electrical stimulation, ultrasound, moist heat, cryotherapy, patient/family education, and DME and/or AE instructions  CONSULTED AND AGREED WITH PLAN OF CARE: Patient  PLAN FOR NEXT SESSION: Reassessment    Trish Mage, OTR/L [phone] 04/28/2023, 9:41 AM

## 2023-05-06 ENCOUNTER — Encounter (HOSPITAL_COMMUNITY): Payer: Self-pay | Admitting: Occupational Therapy

## 2023-05-06 ENCOUNTER — Ambulatory Visit (HOSPITAL_COMMUNITY): Payer: Federal, State, Local not specified - PPO | Admitting: Occupational Therapy

## 2023-05-06 DIAGNOSIS — M25612 Stiffness of left shoulder, not elsewhere classified: Secondary | ICD-10-CM

## 2023-05-06 DIAGNOSIS — R29898 Other symptoms and signs involving the musculoskeletal system: Secondary | ICD-10-CM

## 2023-05-06 DIAGNOSIS — M25512 Pain in left shoulder: Secondary | ICD-10-CM

## 2023-05-06 NOTE — Therapy (Signed)
OUTPATIENT OCCUPATIONAL THERAPY ORTHO TREATMENT NOTE DISCHARGE NOTE  Patient Name: Brian Newton MRN: [id number] DOB:18-Nov-1966, 57 y.o., male Today's Date: 05/06/2023  PCP: Dr. Lynnea Ferrier  REFERRING PROVIDER: Dr. Thane Edu  OCCUPATIONAL THERAPY DISCHARGE SUMMARY  Visits from Start of Care: 20  Current functional level related to goals / functional outcomes: Pt has met 7 out of 8 goals. He has been provided a comprehensive HEP which he is completing with good movement pattern, his ROM is WFL, his pain is on average 1-2/10 with no difficulty sleeping, minimal fascial restrictions, and most motions are 4+/5 to 5/5 strength.   Remaining deficits: Pt has met all OT goals, except for his strength goal, where he is 4/5 strength in abduction. All other movements are 4+/5 and higher.    Education / Equipment: Pt has been provided a comprehensive HEP along with theraband's to continue progressing his mobility and strength.    Plan: Patient agrees to discharge as most of his OT goals have been met.      END OF SESSION:  OT End of Session - 05/06/23 0734     Visit Number 20    Number of Visits 20    Date for OT Re-Evaluation 05/13/23    Authorization Type [employer]    Authorization Time Period 50 visit limit    Authorization - Visit Number 19    Authorization - Number of Visits 50    Progress Note Due on Visit 10    OT Start Time 0730    OT Stop Time 0810    OT Time Calculation (min) 40 min    Activity Tolerance Patient tolerated treatment well    Behavior During Therapy WFL for tasks assessed/performed              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)    Wrist pain    Past Surgical History:  Procedure Laterality Date    APPENDECTOMY  1986   ARTHOSCOPIC ROTAOR CUFF REPAIR Left 01/13/2023   Procedure: ARTHROSCOPIC ROTATOR CUFF REPAIR;  Surgeon: Oliver Barre, MD;  Location: [facility];  Service: Orthopedics;  Laterality: Left;   BREATH TEK H PYLORI N/A 08/08/2014   Procedure: BREATH TEK H PYLORI;  Surgeon: Mariella Saa, MD;  Location: [facility];  Service: General;  Laterality: N/A;   COLONOSCOPY N/A 03/02/2013   Procedure: COLONOSCOPY;  Surgeon: Theda Belfast, MD;  Location: [facility];  Service: Endoscopy;  Laterality: N/A;   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   Procedure: LAPAROSCOPIC ROUX-EN-Y GASTRIC BYPASS WITH UPPER ENDOSCOPY;  Surgeon: Glenna Fellows, MD;  Location: [facility];  Service: General;  Laterality: N/A;   KNEE ARTHROSCOPY  2001   rt knee   OPEN SUBSCAPULARIS REPAIR  01/13/2023   Procedure: OPEN SUBSCAPULARIS REPAIR;  Surgeon: Oliver Barre, MD;  Location: [facility];  Service: Orthopedics;;   SHOULDER ARTHROSCOPY WITH BICEPSTENOTOMY  01/13/2023   Procedure: SHOULDER ARTHROSCOPY WITH BICEPS TENOTOMY;  Surgeon: Oliver Barre, MD;  Location: [facility];  Service: Orthopedics;;   UPPER GI ENDOSCOPY  11/05/2014   Procedure: UPPER GI ENDOSCOPY;  Surgeon: Glenna Fellows, MD;  Location: [facility];  Service: General;;   Patient Active  Problem List   Diagnosis Date Noted   History of colonic polyps 11/27/2021   Morbid obesity (HCC) 04/01/2014   Hypercholesteremia    Hypertension    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)    Metabolic syndrome     ONSET DATE: 01/13/23  REFERRING DIAG: s/p left arthroscopic rotator cuff repair of massive superior cuff tear, with open repair of subscapularis   THERAPY DIAG:  Acute pain of left shoulder  Stiffness of left shoulder, not elsewhere classified  Other symptoms and signs involving the musculoskeletal system  Rationale for Evaluation and Treatment: Rehabilitation  SUBJECTIVE:   SUBJECTIVE STATEMENT: S: "It's just my strength that's not  right."  PERTINENT HISTORY: Pt is a 57 y/o male s/p s/p left arthroscopic rotator cuff repair of massive superior cuff tear, with open repair of subscapularis on 01/13/23. Pt noted to have a massive tear and is on a conservative protocol.   PRECAUTIONS: Shoulder, see protocol for massive tear  WEIGHT BEARING RESTRICTIONS: Yes 5-10lbs  PAIN:  Are you having pain? No  FALLS: Has patient fallen in last 6 months? Yes. Number of falls 1  PLOF: Independent  PATIENT GOALS: To be able to use the LUE.   NEXT MD VISIT: ~6weeks  OBJECTIVE:   HAND DOMINANCE: Right  ADLs: Overall ADLs: Pt is having difficulty with sleeping,   FUNCTIONAL OUTCOME MEASURES: FOTO: 40/100 4/29: 74.42/100 5/17: 77.67/100  UPPER EXTREMITY ROM:     Assessed supine, er/IR adducted  Passive ROM Left eval Left 03/14/23  Shoulder flexion 128 145  Shoulder abduction 82 156  Shoulder internal rotation 90 90  Shoulder external rotation 52 63  (Blank rows = not tested)  Assessed seated, er/IR adducted  Active ROM Left eval Left 03/14/23 Left 04/08/23 Left 05/06/23  Shoulder flexion  103 153 162  Shoulder abduction  76 145 150  Shoulder internal rotation  90 90 90  Shoulder external rotation  38 53 67  (Blank rows = not tested)   UPPER EXTREMITY MMT:       Assessed seated, er/IR adducted  MMT Left eval Left 03/14/23 Left 04/08/23 Left 05/06/23  Shoulder flexion  3-/5 3+/5 4+/5  Shoulder abduction  3-/5 3+/5 4/5  Shoulder internal rotation  3/5 4+/5 5/5  Shoulder external rotation  3/5 4+/5 5/5  (Blank rows = not tested)   EDEMA: Edema observed at left upper arm and anterior shoulder   OBSERVATIONS: Mod fascial restrictions noted in left upper arm, anterior shoulder, trapezius, and scapular regions   TODAY'S TREATMENT:                                                                                                                              DATE:  05/06/23 -A/ROM: flexion, abduction,  protraction, horizontal abduction, er/IR, x15 -Shoulder Strengthening: 4lb dumbbells, flexion, abduction, protraction, horizontal abduction, er/IR, x12 -Stretching: er stretch, doorway stretch, er behind the back, IR behind the head, 4x15" -Strengthening: 3lb dumbbells, around the back  passes, 3lb wrist weights over the head passes, x10 -Measurements for Reassessment  04/28/23 -Ball Rolls on the Wall: flexion, abduction, x15 -Therapy ball exercises: flexion, overhead press, chest press, V ups, circles both directions, x15 -Shoulder strengthening: blue theraband, flexion, abduction, er, IR, horizontal abduction, x15 -Scapular strengthening: blue theraband, extension, retraction, protraction, rows, x15 -Overhead lacing, 2lb wrist weight donned -Functional reaching: 2lb wrist weight donned : 2- 1lbs, 2- 2lbs, 2- 3lbs, 2- 4lbs, from counter to shoulder height shelf, shoulder height  to overhead shelf, then reversing down -Farmers Carry: 4lbs at 90 degrees flexion, 90"  04/18/23 -Shoulder Strengthening: 2lb dumbbell, flexion, abduction, protraction, horizontal abduction, er/IR, x15 -X to V arms, 2lb dumbbells, x15 -Goal post arms, 2lb dumbbells, x15 -PNF Strength: Green theraband, chest pulls, er pulls, PNF up, PNF down, x15 -Farmers Carry: 3lbs at 90 degrees flexion, 90" -Functional reaching: 3lb wrist weight donned, 10 cones from counter to 1st shelf, then first shelf to 2nd shelf in flexion, then taking them down in the same order in abduction, tapping 3rd shelf x10 in flexion and x10 in abduction -[facility] on the wall: flexion, abduction, x15   PATIENT EDUCATION: Education details: Review HEP Person educated: Patient Education method: Programmer, multimedia, Facilities manager, and Handouts Education comprehension: verbalized understanding and returned demonstration  HOME EXERCISE PROGRAM: Eval: table slides 3/4: Isometrics - flexion, abduction, internal rotation only 3/18: AA/ROM 3/21: Scapular  Strengthening and wall slides 3/29: A/ROM 4/9: Functional Reaching in shelf height cabinet 4/12: Loop Band Exercises 4/17: Shoulder Strengthening 4/19: stretching 4/23: Therapy Ball Exercises 4/29: PNF Strengthening  GOALS: Goals reviewed with patient? Yes  SHORT TERM GOALS: Target date: 03/17/23  Pt will be provided with and educated on HEP to improve mobility in LUE required for use during ADL completion.   Goal status: MET  2.  Pt will increase LUE P/ROM by 40 degrees or greater to improve ability to use LUE during dressing tasks with minimal compensatory techniques.   Goal status: MET  3.  Pt will increase LUE strength to 3+/5 to improve ability to reach for items at waist to chest height during bathing and grooming tasks.   Goal status: MET     LONG TERM GOALS: Target date: 04/18/23  Pt will decrease pain in LUE to 3/10 or less to improve ability to sleep for 2+ consecutive hours without waking due to pain.   Goal status: MET  2.  Pt will decrease LUE fascial restrictions to min amounts or less to improve mobility required for functional reaching tasks.   Goal status: MET  3.  Pt will increase LUE A/ROM to at least 145 for flexion/abduction, and 50 degrees er/IR to improve ability to use LUE when reaching overhead or behind back during dressing and bathing tasks.   Goal status: MET  4.  Pt will increase LUE strength to 4+/5 or greater to improve ability to use LUE when lifting or carrying items during meal preparation/housework/yardwork tasks.   Goal status: NOT MET  5.  Pt will return to highest level of function using LUE as non-dominant during functional task completion.   Goal status: MET  ASSESSMENT:  CLINICAL IMPRESSION: This session pt was reassessed, where he demonstrated improved strength and ROM, as well as minimal pain and fascial restrictions. He is demonstrating good movement patterns with minimal shoulder hiking without weights. He continues to  have difficulty with abduction against resistance, where he is having to use the yellow band in order to have maintain good movement pattern.  OT provided continued education for shoulder strengthening exercises at home and pt agrees to continue HEP until his shoulder strength has improved. Pt has no further skilled OT needs at this time and is agreeable to discharge from Outpatient OT.    PERFORMANCE DEFICITS: in functional skills including ADLs, IADLs, ROM, strength, pain, fascial restrictions, and UE functional use   PLAN:  OT FREQUENCY: 1x/week  OT DURATION: 4 weeks  PLANNED INTERVENTIONS: self care/ADL training, therapeutic exercise, therapeutic activity, manual therapy, passive range of motion, splinting, electrical stimulation, ultrasound, moist heat, cryotherapy, patient/family education, and DME and/or AE instructions  CONSULTED AND AGREED WITH PLAN OF CARE: Patient  PLAN FOR NEXT SESSION: Discharge    Trish Mage, OTR/L [phone] 05/06/2023, 7:36 AM

## 2023-05-23 ENCOUNTER — Other Ambulatory Visit: Payer: Self-pay | Admitting: Family Medicine

## 2023-05-23 ENCOUNTER — Other Ambulatory Visit: Payer: Self-pay | Admitting: Orthopedic Surgery

## 2023-05-23 DIAGNOSIS — G8929 Other chronic pain: Secondary | ICD-10-CM

## 2023-05-23 MED ORDER — CYCLOBENZAPRINE HCL 10 MG PO TABS: 10.0000 mg | ORAL_TABLET | Freq: Three times a day (TID) | ORAL | 0 refills | Status: AC | PRN

## 2023-05-23 MED ORDER — DICLOFENAC SODIUM 75 MG PO TBEC: 75.0000 mg | DELAYED_RELEASE_TABLET | Freq: Two times a day (BID) | ORAL | 0 refills | Status: DC

## 2023-06-21 ENCOUNTER — Other Ambulatory Visit: Payer: Self-pay | Admitting: Family Medicine

## 2023-06-21 DIAGNOSIS — I10 Essential (primary) hypertension: Secondary | ICD-10-CM

## 2023-07-19 ENCOUNTER — Ambulatory Visit: Payer: Federal, State, Local not specified - PPO | Admitting: Orthopedic Surgery

## 2023-07-26 ENCOUNTER — Encounter: Payer: Self-pay | Admitting: Orthopedic Surgery

## 2023-07-26 ENCOUNTER — Ambulatory Visit: Payer: Federal, State, Local not specified - PPO | Admitting: Orthopedic Surgery

## 2023-07-26 VITALS — BP 149/79 | HR 78 | Ht 65.0 in | Wt 250.0 lb

## 2023-07-26 DIAGNOSIS — S46012D Strain of muscle(s) and tendon(s) of the rotator cuff of left shoulder, subsequent encounter: Secondary | ICD-10-CM | POA: Diagnosis not present

## 2023-07-26 NOTE — Progress Notes (Signed)
Orthopaedic Postop Note  Assessment: Brian Newton is a 57 y.o. male s/p left arthroscopic rotator cuff repair of massive superior cuff tear, with open repair of subscapularis  DOS: 01/13/2023  Plan: Brian Newton continues to improve.  He has not focused as much on strengthening as I would have expected at this point.  He continues to have good range of motion.  He has weakness in the supraspinatus, but otherwise has.  And strength overall.  Encouraged him to continue to advance his activities.  Let pain be his guide.  Focus on strengthening.  I would like to see him back in 3 months for repeat evaluation.  Follow-up: Return in about 3 months (around 10/26/2023). XR at next visit: None  Subjective:  Chief Complaint  Patient presents with   Post-op Follow-up    Left shoulder RCR 01/13/23 improving increased ROM     History of Present Illness: Brian Newton is a 57 y.o. male who presents following the above stated procedure.  Surgery was approximately 6 months ago.  He has graduated from physical therapy.  He has been working with bands, but otherwise has not been strengthening his left shoulder.  He states his bands have broken, which is limited his activity recently.  Occasional pain in the left shoulder.  He does not take medication on a consistent basis.  He is able to sleep on his left side.  Review of Systems: No fevers or chills No numbness or tingling No Chest Pain No shortness of breath   Objective: BP (!) 149/79   Pulse 78   Ht 5\' 5"  (1.651 m)   Wt 250 lb (113.4 kg)   BMI 41.60 kg/m   Physical Exam:  Surgical incisions are healed.  No surrounding erythema or drainage.  Sensation intact throughout the left upper extremity.  170 degrees of active forward flexion.  Internal rotation to lumbar spine.  4/5 strength of the supraspinatus.  5/5 subscapularis strength.  4+/5 infraspinatus strength.   IMAGING: I personally ordered and reviewed the following  images:  No new imaging obtained today.   Oliver Barre, MD 07/26/2023 9:15 AM

## 2023-09-01 ENCOUNTER — Encounter: Payer: Self-pay | Admitting: *Deleted

## 2023-09-01 NOTE — Progress Notes (Signed)
PATIENT: Brian Newton DOB: Feb 23, 1966  REASON FOR VISIT: follow up HISTORY FROM: patient PRIMARY NEUROLOGIST: Dr. Frances Furbish  Virtual Visit via Video Note  I connected with Brian Newton on 09/02/23 at 11:15 AM EDT by a video enabled telemedicine application located remotely at [facility] and verified that I am speaking with the correct person using two identifiers who was located at their own home.   I discussed the limitations of evaluation and management by telemedicine and the availability of in person appointments. The patient expressed understanding and agreed to proceed.   PATIENT: Brian Newton DOB: [birth year]  REASON FOR VISIT: follow up HISTORY FROM: patient  HISTORY OF PRESENT ILLNESS: Today 09/02/23:  Brian Newton is a 57 y.o. male with a history of obstructive sleep on CPAP. Returns today for follow-up.  Reports that the CPAP is working well for him.  Denies any new issues.  Returns today for an evaluation.     08/31/22: Brian Newton is a 57 year old male with a history of obstructive sleep apnea on CPAP.  He returns today for follow-up.  He reports that the CPAP continues to work well for him.  He has a second machine that he uses when he goes on vacation.  He states he uses the machine nightly.  Still finds it beneficial.    09/01/21: Brian Newton is a 57 year old male with a history of obstructive sleep apnea on CPAP.  He returns today for follow-up.  He reports that the CPAP is working well for him.  He is currently on vacation he reports that when he was on vacation he takes an old machine with him.  He states that he does not miss a night using his CPAP machine.     REVIEW OF SYSTEMS: Out of a complete 14 system review of symptoms, the patient complains only of the following symptoms, and all other reviewed systems are negative.  ALLERGIES: Allergies  Allergen Reactions   Amoxicillin Newton    HOME MEDICATIONS: Outpatient  Medications Prior to Visit  Medication Sig Dispense Refill   amLODipine-benazepril (LOTREL) 5-20 MG capsule TAKE (2) CAPSULES BY MOUTH ONCE EVERY MORNING. 180 capsule 0   aspirin-acetaminophen-caffeine (EXCEDRIN MIGRAINE) 250-250-65 MG tablet Take 1 tablet by mouth every 6 (six) hours as needed for headache.     butalbital-acetaminophen-caffeine (FIORICET) 50-325-40 MG tablet Take 1 tablet by mouth every 4 (four) hours as needed for headache. 20 tablet 0   Calcium Carb-Cholecalciferol (CALCIUM 600 + D PO) Take 1 tablet by mouth in the morning, at noon, and at bedtime.     Cholecalciferol (VITAMIN D) 50 MCG (2000 UT) tablet Take 2,000 Units by mouth daily.     clotrimazole-betamethasone (LOTRISONE) cream APPLY TO THE AFFECTED AREA TOPICALLY TWICE DAILY. (Patient taking differently: Apply 1 Application topically 2 (two) times daily as needed (Newton).) 45 g 0   Cyanocobalamin (B-12) 5000 MCG CAPS Take 5,000 mcg by mouth daily.     cyclobenzaprine (FLEXERIL) 10 MG tablet Take 1 tablet (10 mg total) by mouth 3 (three) times daily as needed. 30 tablet 0   diclofenac (VOLTAREN) 75 MG EC tablet Take 1 tablet (75 mg total) by mouth 2 (two) times daily. 60 tablet 0   diclofenac Sodium (VOLTAREN) 1 % GEL Apply 1 Application topically 4 (four) times daily as needed (pain).     ferrous sulfate 325 (65 FE) MG tablet Take 325 mg by mouth daily with breakfast.     Glucosamine-Chondroitin  750-600 MG TABS Take 1 tablet by mouth daily.     hydrochlorothiazide (HYDRODIURIL) 25 MG tablet TAKE 1 TABLET BY MOUTH ONCE A DAY. 90 tablet 0   loratadine (CLARITIN) 10 MG tablet Take 10 mg by mouth every morning.     Magnesium 400 MG TABS Take 400 mg by mouth daily.     Multiple Vitamins-Minerals (MULTIVITAMIN WITH MINERALS) tablet Take 1 tablet by mouth every morning.      No facility-administered medications prior to visit.    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)    Wrist pain     PAST SURGICAL HISTORY: Past Surgical History:  Procedure Laterality Date   APPENDECTOMY  1986   ARTHOSCOPIC ROTAOR CUFF REPAIR Left 01/13/2023   Procedure: ARTHROSCOPIC ROTATOR CUFF REPAIR;  Surgeon: Oliver Barre, MD;  Location: [facility];  Service: Orthopedics;  Laterality: Left;   BREATH TEK H PYLORI N/A 08/08/2014   Procedure: BREATH TEK H PYLORI;  Surgeon: Mariella Saa, MD;  Location: [facility];  Service: General;  Laterality: N/A;   COLONOSCOPY N/A 03/02/2013   Procedure: COLONOSCOPY;  Surgeon: Theda Belfast, MD;  Location: [facility];  Service: Endoscopy;  Laterality: N/A;   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   Procedure: LAPAROSCOPIC ROUX-EN-Y GASTRIC BYPASS WITH UPPER ENDOSCOPY;  Surgeon: Glenna Fellows, MD;  Location: [facility];  Service: General;  Laterality: N/A;   KNEE ARTHROSCOPY  2001   rt knee   OPEN SUBSCAPULARIS REPAIR  01/13/2023   Procedure: OPEN SUBSCAPULARIS REPAIR;  Surgeon: Oliver Barre, MD;  Location: [facility];  Service: Orthopedics;;   SHOULDER ARTHROSCOPY WITH BICEPSTENOTOMY  01/13/2023   Procedure: SHOULDER ARTHROSCOPY WITH BICEPS TENOTOMY;  Surgeon: Oliver Barre, MD;  Location: [facility];  Service: Orthopedics;;   UPPER GI ENDOSCOPY  11/05/2014   Procedure: UPPER GI ENDOSCOPY;  Surgeon: Glenna Fellows, MD;  Location: [facility];  Service: General;;    FAMILY HISTORY: Family History  Problem Relation Age of Onset   Hypertension Mother    Hypertension Other    Hyperlipidemia Other    Heart disease Other    Obesity Other    Sleep apnea Other    Breast cancer Maternal Grandmother    Colon cancer Maternal Grandfather     SOCIAL HISTORY: Social History   Socioeconomic History   Marital status: Married    Spouse  name: Not on file   Number of children: 2   Years of education: HS   Highest education level: Not on file  Occupational History   Not on file  Tobacco Use   Smoking status: Never   Smokeless tobacco: Never  Substance and Sexual Activity   Alcohol use: No    Alcohol/week: 0.0 standard drinks of alcohol   Drug use: No   Sexual activity: Yes  Other Topics Concern   Not on file  Social History Narrative   Denies caffeine use    Social Determinants of Health   Financial Resource Strain: Not on file  Food Insecurity: Not on file  Transportation Needs: Not on file  Physical Activity: Not on file  Stress: Not on file  Social Connections: Not on file  Intimate Partner Violence: Not on file      PHYSICAL EXAM Generalized: Well developed, in no acute distress   Neurological examination  Mentation: Alert oriented to time, place, history taking. Follows all commands speech and language fluent Cranial nerve II-XII: Facial symmetry noted DIAGNOSTIC DATA (LABS, IMAGING, TESTING) - I reviewed patient records, labs, notes, testing and imaging myself where available.  Lab Results  Component Value Date   WBC 7.2 01/10/2023   HGB 15.1 01/10/2023   HCT 45.7 01/10/2023   MCV 92.0 01/10/2023   PLT 336 01/10/2023      Component Value Date/Time   NA 135 01/10/2023 0820   K 3.7 01/10/2023 0820   CL 99 01/10/2023 0820   CO2 27 01/10/2023 0820   GLUCOSE 97 01/10/2023 0820   BUN 21 (H) 01/10/2023 0820   CREATININE 1.03 01/10/2023 0820   CREATININE 0.97 11/18/2022 0819   CALCIUM 8.9 01/10/2023 0820   PROT 7.0 01/10/2023 0820   ALBUMIN 3.8 01/10/2023 0820   AST 26 01/10/2023 0820   ALT 34 01/10/2023 0820   ALKPHOS 68 01/10/2023 0820   BILITOT 0.4 01/10/2023 0820   GFRNONAA >60 01/10/2023 0820   GFRNONAA 84 11/18/2020 0809   GFRAA 97 11/18/2020 0809   Lab Results  Component Value Date   CHOL 158 11/18/2022   HDL 50 11/18/2022   LDLCALC 92 11/18/2022   TRIG 72 11/18/2022    CHOLHDL 3.2 11/18/2022   Lab Results  Component Value Date   HGBA1C 5.9 (H) 11/18/2022   Lab Results  Component Value Date   VITAMINB12 >2,000 (H) 11/18/2022   Lab Results  Component Value Date   TSH 1.17 04/26/2017      ASSESSMENT AND PLAN 57 y.o. 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 here with:  OSA on CPAP  CPAP compliance excellent Residual AHI is good Encouraged patient to continue using CPAP nightly and > 4 hours each night F/U in 1 year or sooner if needed    Butch Penny, MSN, NP-C 09/02/2023, 10:59 AM [facility] [address], [address] [place], [place] [address] [phone]

## 2023-09-02 ENCOUNTER — Telehealth: Payer: Federal, State, Local not specified - PPO | Admitting: Adult Health

## 2023-09-02 DIAGNOSIS — G4733 Obstructive sleep apnea (adult) (pediatric): Secondary | ICD-10-CM | POA: Diagnosis not present

## 2023-10-02 ENCOUNTER — Other Ambulatory Visit: Payer: Self-pay | Admitting: Family Medicine

## 2023-10-02 DIAGNOSIS — I10 Essential (primary) hypertension: Secondary | ICD-10-CM

## 2023-10-03 ENCOUNTER — Other Ambulatory Visit: Payer: Self-pay | Admitting: Family Medicine

## 2023-10-03 DIAGNOSIS — I10 Essential (primary) hypertension: Secondary | ICD-10-CM

## 2023-10-04 NOTE — Telephone Encounter (Signed)
Requested Prescriptions  Pending Prescriptions Disp Refills   hydrochlorothiazide (HYDRODIURIL) 25 MG tablet [Pharmacy Med Name: hydrochlorothiazide 25 mg tablet] 90 tablet 0    Sig: TAKE 1 TABLET BY MOUTH ONCE A DAY.     Cardiovascular: Diuretics - Thiazide Failed - 10/03/2023  8:37 AM      Failed - Cr in normal range and within 180 days    Creat  Date Value Ref Range Status  11/18/2022 0.97 0.70 - 1.30 mg/dL Final   Creatinine, Ser  Date Value Ref Range Status  01/10/2023 1.03 0.61 - 1.24 mg/dL Final         Failed - K in normal range and within 180 days    Potassium  Date Value Ref Range Status  01/10/2023 3.7 3.5 - 5.1 mmol/L Final         Failed - Na in normal range and within 180 days    Sodium  Date Value Ref Range Status  01/10/2023 135 135 - 145 mmol/L Final         Failed - Last BP in normal range    BP Readings from Last 1 Encounters:  07/26/23 (!) 149/79         Failed - Valid encounter within last 6 months    Recent Outpatient Visits           1 year ago Chronic pain of right knee   [facility] Donita Brooks, MD   1 year ago Actinic keratosis   [facility] Donita Brooks, MD   2 years ago Metabolic syndrome   [facility] Donita Brooks, MD   2 years ago General medical exam   [facility] Donita Brooks, MD   3 years ago Migraine without aura and with status migrainosus, not intractable   [facility] Pickard, Priscille Heidelberg, MD       Future Appointments             In 3 weeks Oliver Barre, MD [facility] [place]   In 1 month Pickard, Priscille Heidelberg, MD [facility]   In 11 months Butch Penny, NP [facility]

## 2023-10-26 ENCOUNTER — Ambulatory Visit: Payer: Federal, State, Local not specified - PPO | Admitting: Orthopedic Surgery

## 2023-10-26 ENCOUNTER — Encounter: Payer: Self-pay | Admitting: Orthopedic Surgery

## 2023-10-26 VITALS — BP 130/64 | HR 74 | Ht 65.0 in | Wt 260.0 lb

## 2023-10-26 DIAGNOSIS — S46012D Strain of muscle(s) and tendon(s) of the rotator cuff of left shoulder, subsequent encounter: Secondary | ICD-10-CM | POA: Diagnosis not present

## 2023-10-26 NOTE — Progress Notes (Signed)
Orthopaedic Postop Note  Assessment: Brian Newton is a 57 y.o. male s/p left arthroscopic rotator cuff repair of massive superior cuff tear, with open repair of subscapularis  DOS: 01/13/2023  Plan: Mr. Heal is doing very well, considering the extent of his injury.  He has good range of motion, but still has some weakness.  He notes that this is improving.  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  Overall, he continues to lack some endurance, but this continues to improve.  Overall, very pleased with his recovery.  I do think he will continue to get stronger.  His function is very good overall.  If he has any further issues, or notes that the left shoulder starting to bother him, I am happy to see him again at any time.  Follow-up: Return if symptoms worsen or fail to improve. XR at next visit: None  Subjective:  Chief Complaint  Patient presents with   Post-op Follow-up    Left shoulder replacement states moving better working on strengthening now     History of Present Illness: Brian Newton is a 57 y.o. male who presents following the above stated procedure.  Surgery was approximately 9-10 months ago.  Overall, he is doing well.  He has very good range of motion.  He notes he is lacking endurance.  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  His motion is much better than it was prior to surgery.  He is not taking medicines on a consistent basis.  He states that they just welcomed their first grandchild.  Review of Systems: No fevers or chills No numbness or tingling No Chest Pain No shortness of breath   Objective: BP 130/64   Pulse 74   Ht 5\' 5"  (1.651 m)   Wt 260 lb (117.9 kg)   BMI 43.27 kg/m   Physical Exam:  Surgical incisions are healed.  No surrounding erythema or drainage.  Sensation intact throughout the left upper extremity.  170 degrees of active forward flexion.  Internal rotation to lumbar spine, continues to lack some motion in this regard.  4/5  deltoid strength. 4/5 strength of the supraspinatus.  5/5 subscapularis strength.  4+/5 infraspinatus strength.   IMAGING: I personally ordered and reviewed the following images:  No new imaging obtained today.   Oliver Barre, MD 10/26/2023 8:57 AM

## 2023-11-25 ENCOUNTER — Other Ambulatory Visit: Payer: Federal, State, Local not specified - PPO

## 2023-11-25 DIAGNOSIS — Z125 Encounter for screening for malignant neoplasm of prostate: Secondary | ICD-10-CM

## 2023-11-25 DIAGNOSIS — I10 Essential (primary) hypertension: Secondary | ICD-10-CM | POA: Diagnosis not present

## 2023-11-25 DIAGNOSIS — E7800 Pure hypercholesterolemia, unspecified: Secondary | ICD-10-CM

## 2023-11-25 DIAGNOSIS — E8881 Metabolic syndrome: Secondary | ICD-10-CM

## 2023-11-25 DIAGNOSIS — E250 Congenital adrenogenital disorders associated with enzyme deficiency: Secondary | ICD-10-CM

## 2023-11-26 LAB — VITAMIN D 25 HYDROXY (VIT D DEFICIENCY, FRACTURES): Vit D, 25-Hydroxy: 69 ng/mL (ref 30–100)

## 2023-11-26 LAB — LIPID PANEL
Cholesterol: 159 mg/dL (ref <200)
HDL: 51 mg/dL (ref >=40)
LDL Cholesterol (Calc): 92 mg/dL
Non-HDL Cholesterol (Calc): 108 mg/dL (ref <130)
Total CHOL/HDL Ratio: 3.1 (calc) (ref <5.0)
Triglycerides: 71 mg/dL (ref <150)

## 2023-11-26 LAB — COMPLETE METABOLIC PANEL WITH GFR
AG Ratio: 1.7 (calc) (ref 1.0–2.5)
ALT: 27 U/L (ref 9–46)
AST: 23 U/L (ref 10–35)
Albumin: 4.1 g/dL (ref 3.6–5.1)
Alkaline phosphatase (APISO): 78 U/L (ref 35–144)
BUN: 16 mg/dL (ref 7–25)
CO2: 30 mmol/L (ref 20–32)
Calcium: 9.2 mg/dL (ref 8.6–10.3)
Chloride: 103 mmol/L (ref 98–110)
Creat: 1.06 mg/dL (ref 0.70–1.30)
Globulin: 2.4 g/dL (ref 1.9–3.7)
Glucose, Bld: 97 mg/dL (ref 65–99)
Potassium: 4.1 mmol/L (ref 3.5–5.3)
Sodium: 140 mmol/L (ref 135–146)
Total Bilirubin: 0.5 mg/dL (ref 0.2–1.2)
Total Protein: 6.5 g/dL (ref 6.1–8.1)
eGFR: 81 mL/min/{1.73_m2} (ref >=60)

## 2023-11-26 LAB — CBC WITH DIFFERENTIAL/PLATELET
Absolute Lymphocytes: 2126 {cells}/uL (ref 850–3900)
Absolute Monocytes: 598 {cells}/uL (ref 200–950)
Basophils Absolute: 33 {cells}/uL (ref 0–200)
Basophils Relative: 0.5 %
Eosinophils Absolute: 202 {cells}/uL (ref 15–500)
Eosinophils Relative: 3.1 %
HCT: 46.4 % (ref 38.5–50.0)
Hemoglobin: 15.7 g/dL (ref 13.2–17.1)
MCH: 30.7 pg (ref 27.0–33.0)
MCHC: 33.8 g/dL (ref 32.0–36.0)
MCV: 90.6 fL (ref 80.0–100.0)
MPV: 10 fL (ref 7.5–12.5)
Monocytes Relative: 9.2 %
Neutro Abs: 3543 {cells}/uL (ref 1500–7800)
Neutrophils Relative %: 54.5 %
Platelets: 349 10*3/uL (ref 140–400)
RBC: 5.12 10*6/uL (ref 4.20–5.80)
RDW: 13.1 % (ref 11.0–15.0)
Total Lymphocyte: 32.7 %
WBC: 6.5 10*3/uL (ref 3.8–10.8)

## 2023-11-26 LAB — HEMOGLOBIN A1C
Hgb A1c MFr Bld: 6 %{Hb} — ABNORMAL HIGH (ref <5.7)
Mean Plasma Glucose: 126 mg/dL
eAG (mmol/L): 7 mmol/L

## 2023-11-26 LAB — VITAMIN B12: Vitamin B-12: >2000 pg/mL — ABNORMAL HIGH (ref 200–1100)

## 2023-11-26 LAB — PSA: PSA: 0.46 ng/mL (ref <=4.00)

## 2023-12-02 ENCOUNTER — Encounter: Payer: Self-pay | Admitting: Family Medicine

## 2023-12-02 ENCOUNTER — Ambulatory Visit (INDEPENDENT_AMBULATORY_CARE_PROVIDER_SITE_OTHER): Payer: Federal, State, Local not specified - PPO | Admitting: Family Medicine

## 2023-12-02 VITALS — BP 130/88 | HR 68 | Temp 97.9°F | Ht 65.0 in | Wt 272.5 lb

## 2023-12-02 DIAGNOSIS — I10 Essential (primary) hypertension: Secondary | ICD-10-CM

## 2023-12-02 DIAGNOSIS — R7303 Prediabetes: Secondary | ICD-10-CM

## 2023-12-02 DIAGNOSIS — Z0001 Encounter for general adult medical examination with abnormal findings: Secondary | ICD-10-CM | POA: Diagnosis not present

## 2023-12-02 DIAGNOSIS — Z0000 Encounter for general adult medical examination without abnormal findings: Secondary | ICD-10-CM

## 2023-12-02 DIAGNOSIS — E291 Testicular hypofunction: Secondary | ICD-10-CM | POA: Diagnosis not present

## 2023-12-02 MED ORDER — SILDENAFIL CITRATE 100 MG PO TABS: 50.0000 mg | ORAL_TABLET | Freq: Every day | ORAL | 11 refills | Status: AC | PRN

## 2023-12-02 NOTE — Progress Notes (Signed)
Subjective:    Patient ID: Brian Newton, male    DOB: 02/21/1966, 57 y.o.   MRN: [id number] Patient is a very pleasant 57 year old Caucasian male here today for complete physical exam.  His last colonoscopy was in 2019 and is due again in 2026.  Since I last saw the patient, he fell and suffered a traumatic tear of 3 out of the 4 muscles in his rotator cuff along with his biceps tendon in his left shoulder.  He underwent surgery to correct this and he is starting to recover.  However he states that he had to be more sedentary because of that.  As a result his A1c has climbed to 6.0.  He denies any chest pain or shortness of breath.  He has had his flu shot, his COVID shot, and his shingles shot.  Most recent lab work is listed below.  He does report fatigue.  He has a history of hypogonadism and he would like to check his testosterone level Immunization History  Administered Date(s) Administered   Influenza Inj Mdck Quad Pf 10/02/2018, 10/06/2018   Influenza,inj,Quad PF,6+ Mos 09/19/2014, 10/02/2015, 10/07/2016, 10/04/2017, 08/16/2019, 10/29/2020   Influenza-Unspecified 09/19/2018, 11/06/2021   Moderna Covid-19 Vaccine Bivalent Booster 3yrs & up 11/06/2021   Tdap 06/20/2007, 11/02/2018   Lab on 11/25/2023  Component Date Value Ref Range Status   WBC 11/25/2023 6.5  3.8 - 10.8 Thousand/uL Final   RBC 11/25/2023 5.12  4.20 - 5.80 Million/uL Final   Hemoglobin 11/25/2023 15.7  13.2 - 17.1 g/dL Final   HCT 40/98/1191 46.4  38.5 - 50.0 % Final   MCV 11/25/2023 90.6  80.0 - 100.0 fL Final   MCH 11/25/2023 30.7  27.0 - 33.0 pg Final   MCHC 11/25/2023 33.8  32.0 - 36.0 g/dL Final   Comment: For adults, a slight decrease in the calculated MCHC value (in the range of 30 to 32 g/dL) is most likely not clinically significant; however, it should be interpreted with caution in correlation with other red cell parameters and the patient's clinical condition.    RDW 11/25/2023 13.1  11.0 - 15.0 %  Final   Platelets 11/25/2023 349  140 - 400 Thousand/uL Final   MPV 11/25/2023 10.0  7.5 - 12.5 fL Final   Neutro Abs 11/25/2023 3,543  1,500 - 7,800 cells/uL Final   Absolute Lymphocytes 11/25/2023 2,126  850 - 3,900 cells/uL Final   Absolute Monocytes 11/25/2023 598  200 - 950 cells/uL Final   Eosinophils Absolute 11/25/2023 202  15 - 500 cells/uL Final   Basophils Absolute 11/25/2023 33  0 - 200 cells/uL Final   Neutrophils Relative % 11/25/2023 54.5  % Final   Total Lymphocyte 11/25/2023 32.7  % Final   Monocytes Relative 11/25/2023 9.2  % Final   Eosinophils Relative 11/25/2023 3.1  % Final   Basophils Relative 11/25/2023 0.5  % Final   Glucose, Bld 11/25/2023 97  65 - 99 mg/dL Final   Comment: .            Fasting reference interval .    BUN 11/25/2023 16  7 - 25 mg/dL Final   Creat 47/82/9562 1.06  0.70 - 1.30 mg/dL Final   eGFR 13/07/6577 81  > OR = 60 mL/min/1.29m2 Final   BUN/Creatinine Ratio 11/25/2023 SEE NOTE:  6 - 22 (calc) Final   Comment:    Not Reported: BUN and Creatinine are within    reference range. .    Sodium 11/25/2023 140  135 - 146 mmol/L Final   Potassium 11/25/2023 4.1  3.5 - 5.3 mmol/L Final   Chloride 11/25/2023 103  98 - 110 mmol/L Final   CO2 11/25/2023 30  20 - 32 mmol/L Final   Calcium 11/25/2023 9.2  8.6 - 10.3 mg/dL Final   Total Protein 16/09/9603 6.5  6.1 - 8.1 g/dL Final   Albumin 54/08/8118 4.1  3.6 - 5.1 g/dL Final   Globulin 14/78/2956 2.4  1.9 - 3.7 g/dL (calc) Final   AG Ratio 11/25/2023 1.7  1.0 - 2.5 (calc) Final   Total Bilirubin 11/25/2023 0.5  0.2 - 1.2 mg/dL Final   Alkaline phosphatase (APISO) 11/25/2023 78  35 - 144 U/L Final   AST 11/25/2023 23  10 - 35 U/L Final   ALT 11/25/2023 27  9 - 46 U/L Final   Hgb A1c MFr Bld 11/25/2023 6.0 (H)  <5.7 % of total Hgb Final   Comment: For someone without known diabetes, a hemoglobin  A1c value between 5.7% and 6.4% is consistent with prediabetes and should be confirmed with a   follow-up test. . For someone with known diabetes, a value <7% indicates that their diabetes is well controlled. A1c targets should be individualized based on duration of diabetes, age, comorbid conditions, and other considerations. . This assay result is consistent with an increased risk of diabetes. . Currently, no consensus exists regarding use of hemoglobin A1c for diagnosis of diabetes for children. .    Mean Plasma Glucose 11/25/2023 126  mg/dL Final   eAG (mmol/L) 21/30/8657 7.0  mmol/L Final   Cholesterol 11/25/2023 159  <200 mg/dL Final   HDL 84/69/6295 51  > OR = 40 mg/dL Final   Triglycerides 28/41/3244 71  <150 mg/dL Final   LDL Cholesterol (Calc) 11/25/2023 92  mg/dL (calc) Final   Comment: Reference range: <100 . Desirable range <100 mg/dL for primary prevention;   <70 mg/dL for patients with CHD or diabetic patients  with > or = 2 CHD risk factors. Marland Kitchen LDL-C is now calculated using the Martin-Hopkins  calculation, which is a validated novel method providing  better accuracy than the Friedewald equation in the  estimation of LDL-C.  Horald Pollen et al. Lenox Ahr. 0102;725(36): 2061-2068  (http://education.[facility].com/faq/FAQ164)    Total CHOL/HDL Ratio 11/25/2023 3.1  <6.4 (calc) Final   Non-HDL Cholesterol (Calc) 11/25/2023 108  <130 mg/dL (calc) Final   Comment: For patients with diabetes plus 1 major ASCVD risk  factor, treating to a non-HDL-C goal of <100 mg/dL  (LDL-C of <40 mg/dL) is considered a therapeutic  option.    Vitamin B-12 11/25/2023 >2,000 (H)  200 - 1,100 pg/mL Final   Vit D, 25-Hydroxy 11/25/2023 69  30 - 100 ng/mL Final   Comment: Vitamin D Status         25-OH Vitamin D: . Deficiency:                    <20 ng/mL Insufficiency:             20 - 29 ng/mL Optimal:                 > or = 30 ng/mL . For 25-OH Vitamin D testing on patients on  D2-supplementation and patients for whom quantitation  of D2 and D3 fractions is required,  the QuestAssureD(TM) 25-OH VIT D, (D2,D3), LC/MS/MS is recommended: order  code 34742 (patients >43yrs). . See Note 1 . Note 1 . For additional information,  please refer to  http://education.[facility].com/faq/FAQ199  (This link is being provided for informational/ educational purposes only.)    PSA 11/25/2023 0.46  < OR = 4.00 ng/mL Final   Comment: The total PSA value from this assay system is  standardized against the WHO standard. The test  result will be approximately 20% lower when compared  to the equimolar-standardized total PSA (Beckman  Coulter). Comparison of serial PSA results should be  interpreted with this fact in mind. . This test was performed using the Siemens  chemiluminescent method. Values obtained from  different assay methods cannot be used interchangeably. PSA levels, regardless of value, should not be interpreted as absolute evidence of the presence or absence of disease.     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 180 capsule 0   aspirin-acetaminophen-caffeine (EXCEDRIN MIGRAINE) 250-250-65 MG tablet Take 1 tablet by mouth every 6 (six) hours as needed for headache.     butalbital-acetaminophen-caffeine (FIORICET) 50-325-40 MG tablet Take 1 tablet by mouth every 4 (four) hours as needed for headache. 20 tablet 0   Calcium Carb-Cholecalciferol (CALCIUM 600 + D PO) Take 1 tablet by mouth in the morning, at noon, and at bedtime.     Cholecalciferol (VITAMIN D) 50 MCG (2000 UT) tablet Take 2,000 Units by mouth  daily.     clotrimazole-betamethasone (LOTRISONE) cream APPLY TO THE AFFECTED AREA TOPICALLY TWICE DAILY. (Patient taking differently: Apply 1 Application topically 2 (two) times daily as needed (rash).) 45 g 0   Cyanocobalamin (B-12) 5000 MCG CAPS Take 5,000 mcg by mouth daily.     cyclobenzaprine (FLEXERIL) 10 MG tablet Take 1 tablet (10 mg total) by mouth 3 (three) times daily as needed. 30 tablet 0   diclofenac (VOLTAREN) 75 MG EC tablet Take 1 tablet (75 mg total) by mouth 2 (two) times daily. 60 tablet 0   diclofenac Sodium (VOLTAREN) 1 % GEL Apply 1 Application topically 4 (four) times daily as needed (pain).     ferrous sulfate 325 (65 FE) MG tablet Take 325 mg by mouth daily with breakfast.     Glucosamine-Chondroitin 750-600 MG TABS Take 1 tablet by mouth daily.     hydrochlorothiazide (HYDRODIURIL) 25 MG tablet TAKE 1 TABLET BY MOUTH ONCE A DAY. 90 tablet 0   loratadine (CLARITIN) 10 MG tablet Take 10 mg by mouth every morning.     Magnesium 400 MG TABS Take 400 mg by mouth daily.     Multiple Vitamins-Minerals (MULTIVITAMIN WITH MINERALS) tablet Take 1 tablet by mouth every morning.      No current facility-administered medications on file prior to visit.   Allergies  Allergen Reactions   Amoxicillin Rash   Social History   Socioeconomic History   Marital status: Married    Spouse name: Not on file   Number of children: 2   Years of education: HS   Highest education level: Not on file  Occupational History   Not on file  Tobacco Use  Smoking status: Never   Smokeless tobacco: Never  Substance and Sexual Activity   Alcohol use: No    Alcohol/week: 0.0 standard drinks of alcohol   Drug use: No   Sexual activity: Yes  Other Topics Concern   Not on file  Social History Narrative   Denies caffeine use    Social Drivers of Corporate investment banker Strain: Not on file  Food Insecurity: Not on file  Transportation Needs: Not on file  Physical Activity: Not on  file  Stress: Not on file  Social Connections: Not on file  Intimate Partner Violence: Not on file      Review of Systems  All other systems reviewed and are negative.      Objective:   Physical Exam Vitals reviewed.  Constitutional:      General: He is not in acute distress.    Appearance: He is well-developed. He is not diaphoretic.  HENT:     Head: Normocephalic and atraumatic.     Right Ear: External ear normal.     Left Ear: External ear normal.     Nose: Nose normal.     Mouth/Throat:     Pharynx: No oropharyngeal exudate.  Eyes:     General: No scleral icterus.       Right eye: No discharge.        Left eye: No discharge.     Conjunctiva/sclera: Conjunctivae normal.     Pupils: Pupils are equal, round, and reactive to light.  Neck:     Thyroid: No thyromegaly.     Vascular: No JVD.     Trachea: No tracheal deviation.  Cardiovascular:     Rate and Rhythm: Normal rate and regular rhythm.     Heart sounds: Normal heart sounds. No murmur heard.    No friction rub. No gallop.  Pulmonary:     Effort: Pulmonary effort is normal. No respiratory distress.     Breath sounds: Normal breath sounds. No wheezing or rales.  Chest:     Chest wall: No tenderness.  Abdominal:     General: Bowel sounds are normal. There is no distension.     Palpations: Abdomen is soft. There is no mass.     Tenderness: There is no abdominal tenderness. There is no guarding or rebound.  Musculoskeletal:        General: No tenderness or deformity. Normal range of motion.     Cervical back: Neck supple.  Lymphadenopathy:     Cervical: No cervical adenopathy.  Skin:    General: Skin is warm.     Coloration: Skin is not pale.     Findings: No erythema or rash.  Neurological:     Mental Status: He is alert and oriented to person, place, and time.     Cranial Nerves: No cranial nerve deficit.     Motor: No abnormal muscle tone.     Coordination: Coordination normal.     Deep Tendon  Reflexes: Reflexes are normal and symmetric.  Psychiatric:        Behavior: Behavior normal.        Thought Content: Thought content normal.        Judgment: Judgment normal.         Assessment & Plan:  General medical exam  Prediabetes  Benign essential HTN  Hypogonadism in male - Plan: Testosterone Total,Free,Bio, Males Blood pressure today is acceptable.  Cholesterol is excellent.  We discussed a coronary artery calcium score and he  will think about this.  Colonoscopy is up-to-date.  PSA is excellent.  Immunizations are up-to-date.  Patient will work on exercise and activity level to try to lower his A1c.  We will repeat his testosterone level and if low, we can resume testosterone injections.  Meanwhile we will try the patient on sildenafil for ED

## 2023-12-03 LAB — TESTOSTERONE TOTAL,FREE,BIO, MALES
Albumin: 4.2 g/dL (ref 3.6–5.1)
Sex Hormone Binding: 37 nmol/L (ref 22–77)
Testosterone, Bioavailable: 66.8 ng/dL — ABNORMAL LOW (ref 110.0–575.0)
Testosterone, Free: 34.7 pg/mL — ABNORMAL LOW (ref 46.0–224.0)
Testosterone: 296 ng/dL (ref 250–827)

## 2023-12-08 ENCOUNTER — Other Ambulatory Visit: Payer: Self-pay | Admitting: Family Medicine

## 2023-12-08 MED ORDER — TESTOSTERONE CYPIONATE 200 MG/ML IM SOLN: 200.0000 mg | INTRAMUSCULAR | 0 refills | Status: DC

## 2023-12-22 ENCOUNTER — Other Ambulatory Visit: Payer: Self-pay | Admitting: Family Medicine

## 2023-12-22 ENCOUNTER — Encounter: Payer: Self-pay | Admitting: Family Medicine

## 2023-12-22 DIAGNOSIS — I10 Essential (primary) hypertension: Secondary | ICD-10-CM

## 2023-12-22 MED ORDER — HYDROCHLOROTHIAZIDE 25 MG PO TABS: 25.0000 mg | ORAL_TABLET | Freq: Every day | ORAL | 0 refills | Status: DC

## 2023-12-23 ENCOUNTER — Other Ambulatory Visit: Payer: Self-pay | Admitting: Family Medicine

## 2023-12-23 DIAGNOSIS — E8881 Metabolic syndrome: Secondary | ICD-10-CM

## 2024-01-10 ENCOUNTER — Ambulatory Visit (HOSPITAL_COMMUNITY)
Admission: RE | Admit: 2024-01-10 | Discharge: 2024-01-10 | Disposition: A | Discharge status: Home / Self Care | Payer: Self-pay | Source: Ambulatory Visit | Attending: Family Medicine | Admitting: Family Medicine

## 2024-01-10 DIAGNOSIS — E8881 Metabolic syndrome: Secondary | ICD-10-CM | POA: Insufficient documentation

## 2024-02-29 DIAGNOSIS — I10 Essential (primary) hypertension: Secondary | ICD-10-CM | POA: Diagnosis not present

## 2024-02-29 DIAGNOSIS — R079 Chest pain, unspecified: Secondary | ICD-10-CM | POA: Diagnosis not present

## 2024-02-29 DIAGNOSIS — R000 Tachycardia, unspecified: Secondary | ICD-10-CM | POA: Diagnosis not present

## 2024-03-29 ENCOUNTER — Other Ambulatory Visit: Payer: Self-pay | Admitting: Family Medicine

## 2024-03-29 DIAGNOSIS — I10 Essential (primary) hypertension: Secondary | ICD-10-CM

## 2024-03-29 MED ORDER — HYDROCHLOROTHIAZIDE 25 MG PO TABS: 25.0000 mg | ORAL_TABLET | Freq: Every day | ORAL | 0 refills | Status: DC

## 2024-03-29 NOTE — Telephone Encounter (Signed)
 Requested Prescriptions  Refused Prescriptions Disp Refills   hydrochlorothiazide (HYDRODIURIL) 25 MG tablet [Pharmacy Med Name: hydrochlorothiazide 25 mg tablet] 90 tablet 0    Sig: Take 1 tablet (25 mg total) by mouth daily.     Cardiovascular: Diuretics - Thiazide Passed - 03/29/2024  4:12 PM      Passed - Cr in normal range and within 180 days    Creat  Date Value Ref Range Status  11/25/2023 1.06 0.70 - 1.30 mg/dL Final         Passed - K in normal range and within 180 days    Potassium  Date Value Ref Range Status  11/25/2023 4.1 3.5 - 5.3 mmol/L Final         Passed - Na in normal range and within 180 days    Sodium  Date Value Ref Range Status  11/25/2023 140 135 - 146 mmol/L Final         Passed - Last BP in normal range    BP Readings from Last 1 Encounters:  12/02/23 130/88         Passed - Valid encounter within last 6 months    Recent Outpatient Visits           3 months ago General medical exam   [facility] Donita Brooks, MD   1 year ago General medical exam   [facility] Pickard, Priscille Heidelberg, MD       Future Appointments             In 5 months Butch Penny, NP [facility]

## 2024-04-04 ENCOUNTER — Encounter: Payer: Self-pay | Admitting: Family Medicine

## 2024-04-04 ENCOUNTER — Telehealth: Admitting: Physician Assistant

## 2024-04-04 ENCOUNTER — Other Ambulatory Visit: Payer: Self-pay

## 2024-04-04 ENCOUNTER — Ambulatory Visit: Admitting: Family Medicine

## 2024-04-04 ENCOUNTER — Ambulatory Visit: Payer: Self-pay

## 2024-04-04 VITALS — BP 130/80 | HR 68 | Temp 98.5°F | Ht 65.0 in | Wt 274.0 lb

## 2024-04-04 DIAGNOSIS — R3129 Other microscopic hematuria: Secondary | ICD-10-CM | POA: Insufficient documentation

## 2024-04-04 DIAGNOSIS — R300 Dysuria: Secondary | ICD-10-CM

## 2024-04-04 LAB — URINALYSIS, ROUTINE W REFLEX MICROSCOPIC
Bacteria, UA: NONE SEEN /HPF
Bilirubin Urine: NEGATIVE
Glucose, UA: NEGATIVE
Hyaline Cast: NONE SEEN /LPF
Ketones, ur: NEGATIVE
Leukocytes,Ua: NEGATIVE
Nitrite: NEGATIVE
Protein, ur: NEGATIVE
Specific Gravity, Urine: 1.015 (ref 1.001–1.035)
WBC, UA: NONE SEEN /HPF (ref 0–5)
pH: 6 (ref 5.0–8.0)

## 2024-04-04 LAB — MICROSCOPIC MESSAGE

## 2024-04-04 MED ORDER — CIPROFLOXACIN HCL 500 MG PO TABS: 500.0000 mg | ORAL_TABLET | Freq: Two times a day (BID) | ORAL | 0 refills | Status: AC

## 2024-04-04 NOTE — Progress Notes (Signed)
 E-Visit for Urinary Problems ? ?Based on what you shared with me, I feel your condition warrants further evaluation and I recommend that you be seen for a face to face office visit.  Male bladder infections are not very common.  We worry about prostate or kidney conditions.  The standard of care is to examine the abdomen and kidneys, and to do a urine and blood test to make sure that something more serious is not going on.  We recommend that you see a provider today.  If your doctor's office is closed [place] has the following Urgent Cares: ? ?  ?NOTE: You will not be charged for this e-visit. ? ?If you are having a true medical emergency please call 911.   ? ?  ? For an urgent face to face visit, [place] has six urgent care centers for your convenience:  ?  ? [facility] ?Get Driving Directions ?[phone] ?[phone] ?[place], [place] [address] ?  ? [facility]) ?Get Driving Directions ?[phone] ?[address] ?[place], [place] [address] ? ?[facility]) ?Get Driving Directions ?[phone] ?[address] ?[place],  [place]  [address] ? ?[facility] ?Get Driving Directions ?[phone] ?[address], [address] ?[place], [place] [address] ?  ?[facility] [place] ?Get Driving Directions  ?[phone] ?[address] ?[address] ?[place], [place] [address] ?  ? Urgent Care at [facility] ?Get Driving Directions ?[phone] ?[address] ?[place], [place] [address] ? ?Your MyChart E-visit questionnaire answers were reviewed by a board certified advanced clinical practitioner to complete your personal care plan based on your specific symptoms.  Thank you for using e-Visits. ?

## 2024-04-04 NOTE — Progress Notes (Signed)
 Patient Office Visit  Assessment & Plan:  Dysuria -     Urinalysis, Routine w reflex microscopic -     Urine Culture; Future -     Ciprofloxacin HCl; Take 1 tablet (500 mg total) by mouth 2 (two) times daily for 10 days.  Dispense: 20 tablet; Refill: 0  Microscopic hematuria -     Urinalysis, Routine w reflex microscopic -     Urine Culture; Future  Other orders -     Microscopic Message   Follow-up on urine culture results.  Cipro 500 mg twice daily. Follow up on urine culture results and notify patient.  We discussed urology consult due to microscopic hematuria but will hold off for now.  If he has recurrence of microscopic hematuria this will be recommended.  Stay well-hydrated Return if symptoms worsen or fail to improve.   Subjective:    Patient ID: Brian Newton, male    DOB: 1966/02/05  Age: 58 y.o. MRN: [id number]  Chief Complaint  Patient presents with   Dysuria    Dysuria    Dysuria for about one week, usually at end of his stream he notices burning sensation. No fever or chills or back pain. Pt drinks 6-8 glasses of water (actually diluted unsweetened tea) Pt gets up once at night time which is normal for him.  No gross hematuria recently but did notice strands of something in urine today.  No penile discharge. No history of kidney stones in the past.  No fever or chills, nausea or vomiting.  Pt does take diuretic which causes urinary frequency and urgency.  Pt has never had UTI or prostate infections in the past. Pt does have prediabetes. Pt is nonsmoker. 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)    Wrist pain    Past Surgical  History:  Procedure Laterality Date   APPENDECTOMY  1986   ARTHOSCOPIC ROTAOR CUFF REPAIR Left 01/13/2023   Procedure: ARTHROSCOPIC ROTATOR CUFF REPAIR;  Surgeon: Oliver Barre, MD;  Location: [facility];  Service: Orthopedics;  Laterality: Left;   BREATH TEK H PYLORI N/A 08/08/2014   Procedure: BREATH TEK H PYLORI;  Surgeon: Mariella Saa, MD;  Location: [facility];  Service: General;  Laterality: N/A;   COLONOSCOPY N/A 03/02/2013   Procedure: COLONOSCOPY;  Surgeon: Theda Belfast, MD;  Location: [facility];  Service: Endoscopy;  Laterality: N/A;   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   Procedure: LAPAROSCOPIC ROUX-EN-Y GASTRIC BYPASS WITH UPPER ENDOSCOPY;  Surgeon: Glenna Fellows, MD;  Location: [facility];  Service: General;  Laterality: N/A;   KNEE ARTHROSCOPY  2001   rt knee   OPEN SUBSCAPULARIS REPAIR  01/13/2023   Procedure: OPEN SUBSCAPULARIS REPAIR;  Surgeon: Oliver Barre, MD;  Location: [facility];  Service: Orthopedics;;   SHOULDER ARTHROSCOPY WITH BICEPSTENOTOMY  01/13/2023   Procedure: SHOULDER ARTHROSCOPY WITH BICEPS TENOTOMY;  Surgeon: Oliver Barre, MD;  Location: [facility];  Service: Orthopedics;;   UPPER GI ENDOSCOPY  11/05/2014   Procedure: UPPER GI ENDOSCOPY;  Surgeon: Glenna Fellows, MD;  Location: [facility];  Service: General;;   Social History   Tobacco Use   Smoking status: Never   Smokeless tobacco: Never  Substance Use Topics   Alcohol use: No    Alcohol/week: 0.0 standard drinks of alcohol   Drug use: No   Family History  Problem Relation Age of Onset   Hypertension Mother    Hypertension Other    Hyperlipidemia Other    Heart disease Other    Obesity Other    Sleep apnea Other    Breast cancer Maternal Grandmother    Colon cancer Maternal Grandfather    Allergies  Allergen Reactions   Amoxicillin Rash    Review of Systems  Genitourinary:  Positive for dysuria.      Objective:    BP 130/80   Pulse 68   Temp 98.5 F (36.9 C)   Ht 5\' 5"   (1.651 m)   Wt 274 lb (124.3 kg)   SpO2 99%   BMI 45.60 kg/m  BP Readings from Last 3 Encounters:  04/04/24 130/80  12/02/23 130/88  10/26/23 130/64   Wt Readings from Last 3 Encounters:  04/04/24 274 lb (124.3 kg)  12/02/23 272 lb 8 oz (123.6 kg)  10/26/23 260 lb (117.9 kg)    Physical Exam Vitals and nursing note reviewed.  Constitutional:      Appearance: Normal appearance.  HENT:     Head: Normocephalic.     Right Ear: Tympanic membrane normal.     Left Ear: Tympanic membrane normal.  Eyes:     Extraocular Movements: Extraocular movements intact.     Pupils: Pupils are equal, round, and reactive to light.  Cardiovascular:     Rate and Rhythm: Normal rate and regular rhythm.     Heart sounds: Normal heart sounds.  Pulmonary:     Effort: Pulmonary effort is normal.     Breath sounds: Normal breath sounds.  Abdominal:     Tenderness: There is no abdominal tenderness. There is no right CVA tenderness, left CVA tenderness or guarding.  Genitourinary:    Pubic Area: No rash.      Penis: Normal and circumcised. No discharge or lesions.      Prostate: Normal. Not enlarged and not tender.     Rectum: Normal. No tenderness.  Neurological:     General: No focal deficit present.     Mental Status: He is alert and oriented to person, place, and time.  Psychiatric:        Mood and Affect: Mood normal.        Behavior: Behavior normal.      Results for orders placed or performed in visit on 04/04/24  Urinalysis, Routine w reflex microscopic  Result Value Ref Range   Color, Urine YELLOW YELLOW   APPearance CLEAR CLEAR   Specific Gravity, Urine 1.015 1.001 - 1.035   pH 6.0 5.0 - 8.0   Glucose, UA NEGATIVE NEGATIVE   Bilirubin Urine NEGATIVE NEGATIVE   Ketones, ur NEGATIVE NEGATIVE   Hgb urine dipstick 2+ (A) NEGATIVE   Protein, ur NEGATIVE NEGATIVE   Nitrite NEGATIVE NEGATIVE   Leukocytes,Ua NEGATIVE NEGATIVE   WBC, UA NONE SEEN 0 - 5 /HPF   RBC / HPF 10-20 (A) 0 -  2 /HPF   Squamous Epithelial / HPF 0-5 < OR = 5 /HPF   Bacteria, UA NONE SEEN NONE SEEN /HPF   Hyaline Cast NONE SEEN NONE SEEN /LPF  Microscopic Message  Result Value Ref Range   Note

## 2024-04-04 NOTE — Telephone Encounter (Signed)
  Chief Complaint: burning with urination Symptoms: burning with urination Frequency: about a week Pertinent Negatives: Patient denies fever, hematuria, odorous urine, cloudy urine, abd pain, flank pain Disposition: [] ED /[] Urgent Care (no appt availability in office) / [x] Appointment(In office/virtual)/ []  [place] [facility]/ [] Home Care/ [] Refused Recommended Disposition /[] [place] [facility]/ []  Follow-up with PCP Additional Notes: Pt states that about a week ago he started having burning with urination off/on. Pt states that it has progressed to consistently occurring during any urination. Pt states that he was seen recently for an evisit, but they advised that he should be seen in person. Pt scheduled today.  Copied from CRM [id number]. Topic: Clinical - Red Word Triage >> Apr 04, 2024  7:58 AM Emylou G wrote: [facility] that prompted transfer to Nurse Triage: burning in urination Reason for Disposition  All other urine symptoms  All other males with painful urination  Answer Assessment - Initial Assessment Questions 1. SYMPTOM: "What's the main symptom you're concerned about?" (e.g., frequency, incontinence)     burning 2. ONSET: "When did the  burning  start?"     About a week 3. PAIN: "Is there any pain?" If Yes, ask: "How bad is it?" (Scale: 1-10; mild, moderate, severe)     2 4. CAUSE: "What do you think is causing the symptoms?"     I do not know, I am on a fluid, so I urinate often.I have torn myself while waiting to pee to long 5. OTHER SYMPTOMS: "Do you have any other symptoms?" (e.g., blood in urine, fever, flank pain, pain with urination)     denies  Answer Assessment - Initial Assessment Questions 1. SEVERITY: "How bad is the pain?"  (e.g., Scale 1-10; mild, moderate, or severe)   - MILD (1-3): Complains slightly about urination hurting.   - MODERATE (4-7): Interferes with normal activities.     - SEVERE (8-10): Excruciating, unwilling or unable to urinate because  of the pain.      2 3. PATTERN: "Is pain present every time you urinate or just sometimes?"      Originally started as only occasional pain within urination, it is now every time he uses the restroom 4. ONSET: "When did the painful urination start?"      About a week ago 5. FEVER: "Do you have a fever?" If Yes, ask: "What is your temperature, how was it measured, and when did it start?"     denies 8. OTHER SYMPTOMS: "Do you have any other symptoms?" (e.g., flank pain, penis discharge, scrotal pain, blood in urine)     denies  Protocols used: Urinary Symptoms-A-AH, Urination Pain - Male-A-AH

## 2024-04-05 LAB — URINE CULTURE
MICRO NUMBER:: 16337379
Result:: NO GROWTH
SPECIMEN QUALITY:: ADEQUATE

## 2024-04-06 ENCOUNTER — Encounter: Payer: Self-pay | Admitting: Family Medicine

## 2024-04-28 ENCOUNTER — Encounter: Payer: Self-pay | Admitting: Family Medicine

## 2024-05-01 ENCOUNTER — Other Ambulatory Visit: Payer: Self-pay | Admitting: Family Medicine

## 2024-05-01 DIAGNOSIS — G8929 Other chronic pain: Secondary | ICD-10-CM

## 2024-05-01 MED ORDER — DICLOFENAC SODIUM 75 MG PO TBEC: 75.0000 mg | DELAYED_RELEASE_TABLET | Freq: Two times a day (BID) | ORAL | 3 refills | Status: AC

## 2024-05-02 ENCOUNTER — Ambulatory Visit: Payer: Self-pay

## 2024-05-02 NOTE — Telephone Encounter (Signed)
 Chief Complaint: Hematuria and burning with urination Symptoms: blood in urine, burning with urination Frequency: started back up yesterday Pertinent Negatives: Patient denies fever, back pain, abdominal pain Disposition: [] ED /[] Urgent Care (no appt availability in office) / [x] Appointment(In office/virtual)/ []  [place] [facility]/ [] Home Care/ [] Refused Recommended Disposition /[] [place] [facility]/ []  Follow-up with PCP Additional Notes: Patient called in stating he is having slight blood in his urine again, and burning with urination. Patient was seen in office for this roughly 1 month ago and treated with antibiotics. Patient noticed blood in urine yesterday and burning with urination. Patient appt made for tomorrow.   Copied from CRM [id number]. Topic: Clinical - Red Word Triage >> May 02, 2024  9:40 AM Antwanette L wrote: Red Word that prompted transfer to Nurse Triage: Patient is has blood in his urine and also experiencing pain Reason for Disposition  Pain or burning with passing urine  Answer Assessment - Initial Assessment Questions 1. COLOR of URINE: "Describe the color of the urine."  (e.g., tea-colored, pink, red, bloody) "Do you have blood clots in your urine?" (e.g., none, pea, grape, small coin)     Pinkish tint after wiping with toilet paper after urinating 2. ONSET: "When did the bleeding start?"      Started back up yesterday 3. EPISODES: "How many times has there been blood in the urine?" or "How many times today?"     N/a 4. PAIN with URINATION: "Is there any pain with passing your urine?" If Yes, ask: "How bad is the pain?"  (Scale 1-10; or mild, moderate, severe)    - MILD: Complains slightly about urination hurting.    - MODERATE: Interferes with normal activities.      - SEVERE: Excruciating, unwilling or unable to urinate because of the pain.      Burning with urinating, mild currently 5. FEVER: "Do you have a fever?" If Yes, ask: "What is your temperature,  how was it measured, and when did it start?"     No 6. ASSOCIATED SYMPTOMS: "Are you passing urine more frequently than usual?"     Patient is on fluid pill so states he urinates a lot anyway 7. OTHER SYMPTOMS: "Do you have any other symptoms?" (e.g., back/flank pain, abdomen pain, vomiting)     No  Protocols used: Urine - Blood In-A-AH

## 2024-05-03 ENCOUNTER — Ambulatory Visit: Admitting: Family Medicine

## 2024-05-03 ENCOUNTER — Encounter: Payer: Self-pay | Admitting: Family Medicine

## 2024-05-03 VITALS — BP 126/84 | HR 64 | Temp 98.0°F | Ht 65.0 in | Wt 278.4 lb

## 2024-05-03 DIAGNOSIS — R300 Dysuria: Secondary | ICD-10-CM | POA: Diagnosis not present

## 2024-05-03 LAB — URINALYSIS, ROUTINE W REFLEX MICROSCOPIC
Bilirubin Urine: NEGATIVE
Glucose, UA: NEGATIVE
Hgb urine dipstick: NEGATIVE
Ketones, ur: NEGATIVE
Leukocytes,Ua: NEGATIVE
Nitrite: NEGATIVE
Protein, ur: NEGATIVE
Specific Gravity, Urine: 1.015 (ref 1.001–1.035)
pH: 7.5 (ref 5.0–8.0)

## 2024-05-03 NOTE — Assessment & Plan Note (Signed)
 Symptoms have completely resolved and UA is negative in office today. Discussed possible urology referral if this continues to occur. He will continue to monitor and return to office if symptoms return.

## 2024-05-03 NOTE — Progress Notes (Signed)
 Subjective:  HPI: Brian [place] is a 58 y.o. male presenting on 05/03/2024 for Dysuria (Since Tuesday. Also noticed hematuria. )   Dysuria    Patient is in today for dysuria and hematuria from Tuesday to yesterday afternoon. Urine was pink on tissue paper and toilet rim. This happened previously 1 month ago and resolved with antibiotics, no prior occurrences. Symptoms resolved last night. Denies flank pain, abdominal pain or distention, urinary frequency, fever, chills, body aches, discharge, rash or lesions, trauma. He declines need for STI testing today.    Review of Systems  Genitourinary:  Positive for dysuria.  All other systems reviewed and are negative.   Relevant past medical history reviewed and updated as indicated.   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)    Wrist pain      Past Surgical History:  Procedure Laterality Date   APPENDECTOMY  1986   ARTHOSCOPIC ROTAOR CUFF REPAIR Left 01/13/2023   Procedure: ARTHROSCOPIC ROTATOR CUFF REPAIR;  Surgeon: Tonita Frater, MD;  [facility]: [facility];  Service: Orthopedics;  Laterality: Left;   BREATH TEK H PYLORI N/A 08/08/2014   Procedure: BREATH TEK H PYLORI;  Surgeon: Quitman Bucy, MD;  Location: [facility];  Service: General;  Laterality: N/A;   COLONOSCOPY N/A 03/02/2013   Procedure: COLONOSCOPY;  Surgeon: Almeda Aris, MD;  Location: [facility];  Service: Endoscopy;  Laterality: N/A;   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   Procedure: LAPAROSCOPIC ROUX-EN-Y GASTRIC BYPASS WITH UPPER ENDOSCOPY;  Surgeon: Ayesha Lente, MD;  Location: [facility];  Service: General;  Laterality: N/A;   KNEE ARTHROSCOPY  2001   rt knee   OPEN SUBSCAPULARIS REPAIR  01/13/2023   Procedure:  OPEN SUBSCAPULARIS REPAIR;  Surgeon: Tonita Frater, MD;  Location: [facility];  Service: Orthopedics;;   SHOULDER ARTHROSCOPY WITH BICEPSTENOTOMY  01/13/2023   Procedure: SHOULDER ARTHROSCOPY WITH BICEPS TENOTOMY;  Surgeon: Tonita Frater, MD;  Location: [facility];  Service: Orthopedics;;   UPPER GI ENDOSCOPY  11/05/2014   Procedure: UPPER GI ENDOSCOPY;  Surgeon: Ayesha Lente, MD;  [facility];  Service: General;;    Allergies and medications reviewed and updated.   Current Outpatient Medications:    amLODipine -benazepril  (LOTREL) 5-20 MG capsule, TAKE (2) CAPSULES BY MOUTH ONCE EVERY MORNING., Disp: 180 capsule, Rfl: 1   aspirin -acetaminophen -caffeine  (EXCEDRIN MIGRAINE) 250-250-65 MG tablet, Take 1 tablet by mouth every 6 (six) hours as needed for headache., Disp: , Rfl:    butalbital -acetaminophen -caffeine  (FIORICET) 50-325-40 MG tablet, Take 1 tablet by mouth every 4 (four) hours as needed for headache., Disp: 20 tablet, Rfl: 0   Calcium Carb-Cholecalciferol (CALCIUM 600 + D PO), Take 1 tablet by mouth in the morning, at noon, and at bedtime., Disp: , Rfl:    Cholecalciferol (VITAMIN D ) 50 MCG (2000 UT) tablet, Take 2,000 Units by mouth daily., Disp: , Rfl:    clotrimazole -betamethasone  (LOTRISONE ) cream, APPLY TO THE AFFECTED AREA TOPICALLY TWICE DAILY. (Patient taking differently: Apply 1 Application topically 2 (two) times daily as needed (rash).), Disp: 45 g, Rfl: 0   Cyanocobalamin  (B-12) 5000 MCG CAPS, Take 5,000 mcg by mouth daily., Disp: , Rfl:    cyclobenzaprine  (  FLEXERIL ) 10 MG tablet, Take 1 tablet (10 mg total) by mouth 3 (three) times daily as needed., Disp: 30 tablet, Rfl: 0   diclofenac  (VOLTAREN ) 75 MG EC tablet, Take 1 tablet (75 mg total) by mouth 2 (two) times daily., Disp: 60 tablet, Rfl: 3   diclofenac  Sodium (VOLTAREN ) 1 % GEL, Apply 1 Application topically 4 (four) times daily as needed (pain)., Disp: , Rfl:    ferrous sulfate 325 (65 FE) MG tablet, Take 325 mg by  mouth daily with breakfast., Disp: , Rfl:    hydrochlorothiazide  (HYDRODIURIL ) 25 MG tablet, Take 1 tablet (25 mg total) by mouth daily., Disp: 90 tablet, Rfl: 0   loratadine (CLARITIN) 10 MG tablet, Take 10 mg by mouth every morning., Disp: , Rfl:    Magnesium 400 MG TABS, Take 400 mg by mouth daily., Disp: , Rfl:    Multiple Vitamins-Minerals (MULTIVITAMIN WITH MINERALS) tablet, Take 1 tablet by mouth every morning. , Disp: , Rfl:    sildenafil  (VIAGRA ) 100 MG tablet, Take 0.5-1 tablets (50-100 mg total) by mouth daily as needed for erectile dysfunction., Disp: 5 tablet, Rfl: 11  Allergies  Allergen Reactions   Amoxicillin Rash    Objective:   BP 126/84   Pulse 64   Temp 98 F (36.7 C)   Ht 5\' 5"  (1.651 m)   Wt 278 lb 6 oz (126.3 kg)   SpO2 98%   BMI 46.32 kg/m      05/03/2024   10:14 AM 04/04/2024    9:00 AM 12/02/2023    8:24 AM  Vitals with BMI  Height 5\' 5"  5\' 5"  5\' 5"   Weight 278 lbs 6 oz 274 lbs 272 lbs 8 oz  BMI 46.32 45.6 45.35  Systolic 126 130 161  Diastolic 84 80 88  Pulse 64 68 68     Physical Exam Vitals and nursing note reviewed.  Constitutional:      Appearance: Normal appearance. He is normal weight.  HENT:     Head: Normocephalic and atraumatic.  Genitourinary:    Comments: Patient declined Skin:    General: Skin is warm and dry.     Capillary Refill: Capillary refill takes less than 2 seconds.  Neurological:     General: No focal deficit present.     Mental Status: He is alert and oriented to person, place, and time. Mental status is at baseline.  Psychiatric:        Mood and Affect: Mood normal.        Behavior: Behavior normal.        Thought Content: Thought content normal.        Judgment: Judgment normal.     Assessment & Plan:  Dysuria Assessment & Plan: Symptoms have completely resolved and UA is negative in office today. Discussed possible urology referral if this continues to occur. He will continue to monitor and return to  office if symptoms return.  Orders: -     Urinalysis, Routine w reflex microscopic     Follow up plan: Return if symptoms worsen or fail to improve.  Jenelle Mis, FNP

## 2024-06-17 ENCOUNTER — Other Ambulatory Visit: Payer: Self-pay | Admitting: Family Medicine

## 2024-06-21 ENCOUNTER — Other Ambulatory Visit: Payer: Self-pay | Admitting: Family Medicine

## 2024-06-21 DIAGNOSIS — I10 Essential (primary) hypertension: Secondary | ICD-10-CM

## 2024-09-03 ENCOUNTER — Encounter: Payer: Self-pay | Admitting: *Deleted

## 2024-09-04 ENCOUNTER — Telehealth (INDEPENDENT_AMBULATORY_CARE_PROVIDER_SITE_OTHER): Payer: Federal, State, Local not specified - PPO | Admitting: Adult Health

## 2024-09-04 DIAGNOSIS — G4733 Obstructive sleep apnea (adult) (pediatric): Secondary | ICD-10-CM

## 2024-09-04 NOTE — Patient Instructions (Signed)
 Continue using CPAP nightly and greater than 4 hours each night If your symptoms worsen or you develop new symptoms please let us  know.

## 2024-09-04 NOTE — Progress Notes (Signed)
 PATIENT: Brian [place] DOB: May 27, 1966  REASON FOR VISIT: follow up HISTORY FROM: patient PRIMARY NEUROLOGIST: Dr. Buck  Virtual Visit via Video Note  I connected with Brian [place] on 09/04/24 at  3:00 PM EDT by a video enabled telemedicine application located remotely at [facility] and verified that I am speaking with the correct person using two identifiers who was located at their own home in [place]   I discussed the limitations of evaluation and management by telemedicine and the availability of in person appointments. The patient expressed understanding and agreed to proceed.   PATIENT: Brian [place] DOB: October 15, 1966  REASON FOR VISIT: follow up HISTORY FROM: patient  HISTORY OF PRESENT ILLNESS: Today 09/04/24:  Brian [place] is a 58 y.o. male with a history of obstructive sleep apnea on CPAP. Returns today for follow-up.  Overall he is doing well.  Continues to find the CPAP beneficial.  He does have a second machine that he uses when he goes on vacation.  Currently wearing the nasal mask comfort gel.  His download is below       09/02/23: Brian [place] is a 58 y.o. male with a history of obstructive sleep on CPAP. Returns today for follow-up.  Reports that the CPAP is working well for him.  Denies any new issues.  Returns today for an evaluation.     08/31/22: Brian [place] is a 58 year old male with a history of obstructive sleep apnea on CPAP.  He returns today for follow-up.  He reports that the CPAP continues to work well for him.  He has a second machine that he uses when he goes on vacation.  He states he uses the machine nightly.  Still finds it beneficial.    09/01/21: Brian [place] is a 58 year old male with a history of obstructive sleep apnea on CPAP.  He returns today for follow-up.  He reports that the CPAP is working well for him.  He is currently on vacation he reports that when he was on vacation he takes an old machine  with him.  He states that he does not miss a night using his CPAP machine.     REVIEW OF SYSTEMS: Out of a complete 14 system review of symptoms, the patient complains only of the following symptoms, and all other reviewed systems are negative.  ALLERGIES: Allergies  Allergen Reactions   Amoxicillin Rash    HOME MEDICATIONS: Outpatient Medications Prior to Visit  Medication Sig Dispense Refill   amLODipine -benazepril  (LOTREL) 5-20 MG capsule TAKE (2) CAPSULES BY MOUTH ONCE EVERY MORNING. 180 capsule 1   aspirin -acetaminophen -caffeine  (EXCEDRIN MIGRAINE) 250-250-65 MG tablet Take 1 tablet by mouth every 6 (six) hours as needed for headache.     butalbital -acetaminophen -caffeine  (FIORICET) 50-325-40 MG tablet Take 1 tablet by mouth every 4 (four) hours as needed for headache. 20 tablet 0   Calcium Carb-Cholecalciferol (CALCIUM 600 + D PO) Take 1 tablet by mouth in the morning, at noon, and at bedtime.     Cholecalciferol (VITAMIN D ) 50 MCG (2000 UT) tablet Take 2,000 Units by mouth daily.     clotrimazole -betamethasone  (LOTRISONE ) cream APPLY TO THE AFFECTED AREA TOPICALLY TWICE DAILY. (Patient taking differently: Apply 1 Application topically 2 (two) times daily as needed (rash).) 45 g 0   Cyanocobalamin  (B-12) 5000 MCG CAPS Take 5,000 mcg by mouth daily.     cyclobenzaprine  (FLEXERIL ) 10 MG tablet Take 1 tablet (10 mg total) by mouth 3 (three) times  daily as needed. 30 tablet 0   diclofenac  (VOLTAREN ) 75 MG EC tablet Take 1 tablet (75 mg total) by mouth 2 (two) times daily. 60 tablet 3   diclofenac  Sodium (VOLTAREN ) 1 % GEL Apply 1 Application topically 4 (four) times daily as needed (pain).     ferrous sulfate 325 (65 FE) MG tablet Take 325 mg by mouth daily with breakfast.     hydrochlorothiazide  (HYDRODIURIL ) 25 MG tablet Take 1 tablet (25 mg total) by mouth daily. 90 tablet 0   loratadine (CLARITIN) 10 MG tablet Take 10 mg by mouth every morning.     Magnesium 400 MG TABS Take 400 mg  by mouth daily.     Multiple Vitamins-Minerals (MULTIVITAMIN WITH MINERALS) tablet Take 1 tablet by mouth every morning.      sildenafil  (VIAGRA ) 100 MG tablet Take 0.5-1 tablets (50-100 mg total) by mouth daily as needed for erectile dysfunction. 5 tablet 11   No facility-administered medications prior to visit.    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)    Wrist pain     PAST SURGICAL HISTORY: Past Surgical History:  Procedure Laterality Date   APPENDECTOMY  1986   ARTHOSCOPIC ROTAOR CUFF REPAIR Left 01/13/2023   Procedure: ARTHROSCOPIC ROTATOR CUFF REPAIR;  Surgeon: Onesimo Oneil LABOR, MD;  Location: [facility];  Service: Orthopedics;  Laterality: Left;   BREATH TEK H PYLORI N/A 08/08/2014   Procedure: BREATH TEK H PYLORI;  Surgeon: Morene ONEIDA Olives, MD;  Location: [facility];  Service: General;  Laterality: N/A;   COLONOSCOPY N/A 03/02/2013   Procedure: COLONOSCOPY;  Surgeon: Belvie JONETTA Just, MD;  Location: [facility];  Service: Endoscopy;  Laterality: N/A;   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   Procedure: LAPAROSCOPIC ROUX-EN-Y GASTRIC BYPASS WITH UPPER ENDOSCOPY;  Surgeon: Morene Olives, MD;  Location: [facility];  Service: General;  Laterality: N/A;   KNEE ARTHROSCOPY  2001   rt knee   OPEN SUBSCAPULARIS REPAIR  01/13/2023   Procedure: OPEN SUBSCAPULARIS REPAIR;  Surgeon: Onesimo Oneil LABOR, MD;  Location: [facility];  Service: Orthopedics;;   SHOULDER ARTHROSCOPY WITH BICEPSTENOTOMY  01/13/2023   Procedure: SHOULDER ARTHROSCOPY WITH BICEPS TENOTOMY;  Surgeon: Onesimo Oneil LABOR, MD;  Location: [facility];  Service: Orthopedics;;   UPPER GI ENDOSCOPY  11/05/2014   Procedure: UPPER GI ENDOSCOPY;  Surgeon: Morene Olives, MD;  Location:  [facility];  Service: General;;    FAMILY HISTORY: Family History  Problem Relation Age of Onset   Hypertension Mother    Hypertension Other    Hyperlipidemia Other    Heart disease Other    Obesity Other    Sleep apnea Other    Breast cancer Maternal Grandmother    Colon cancer Maternal Grandfather     SOCIAL HISTORY: Social History   Socioeconomic History   Marital status: Married    Spouse name: Not on file   Number of children: 2   Years of education: HS   Highest education level: Some college, no degree  Occupational History   Not on file  Tobacco Use   Smoking status: Never   Smokeless tobacco: Never  Substance and Sexual Activity  Alcohol use: No    Alcohol/week: 0.0 standard drinks of alcohol   Drug use: No   Sexual activity: Yes  Other Topics Concern   Not on file  Social History Narrative   Denies caffeine  use    Social Drivers of Health   Financial Resource Strain: Low Risk  (05/02/2024)   Overall Financial Resource Strain (CARDIA)    Difficulty of Paying Living Expenses: Not hard at all  Food Insecurity: No Food Insecurity (05/02/2024)   Hunger Vital Sign    Worried About Running Out of Food in the Last Year: Never true    Ran Out of Food in the Last Year: Never true  Transportation Needs: No Transportation Needs (05/02/2024)   PRAPARE - Administrator, Civil Service (Medical): No    Lack of Transportation (Non-Medical): No  Physical Activity: Sufficiently Active (05/02/2024)   Exercise Vital Sign    Days of Exercise per Week: 3 days    Minutes of Exercise per Session: 60 min  Stress: No Stress Concern Present (05/02/2024)   [employer] of Occupational Health - Occupational Stress Questionnaire    Feeling of Stress : Only a little  Social Connections: Moderately Integrated (05/02/2024)   Social Connection and Isolation Panel    Frequency of Communication with Friends and Family: More than three times a week    Frequency of Social  Gatherings with Friends and Family: Three times a week    Attends Religious Services: More than 4 times per year    Active Member of Clubs or Organizations: No    Attends Engineer, structural: Not on file    Marital Status: Married  Catering manager Violence: Not on file      PHYSICAL EXAM Generalized: Well developed, in no acute distress   Neurological examination  Mentation: Alert oriented to time, place, history taking. Follows all commands speech and language fluent Cranial nerve II-XII: Facial symmetry noted DIAGNOSTIC DATA (LABS, IMAGING, TESTING) - I reviewed patient records, labs, notes, testing and imaging myself where available.  Lab Results  Component Value Date   WBC 6.5 11/25/2023   HGB 15.7 11/25/2023   HCT 46.4 11/25/2023   MCV 90.6 11/25/2023   PLT 349 11/25/2023      Component Value Date/Time   NA 140 11/25/2023 0810   K 4.1 11/25/2023 0810   CL 103 11/25/2023 0810   CO2 30 11/25/2023 0810   GLUCOSE 97 11/25/2023 0810   BUN 16 11/25/2023 0810   CREATININE 1.06 11/25/2023 0810   CALCIUM 9.2 11/25/2023 0810   PROT 6.5 11/25/2023 0810   ALBUMIN 3.8 01/10/2023 0820   AST 23 11/25/2023 0810   ALT 27 11/25/2023 0810   ALKPHOS 68 01/10/2023 0820   BILITOT 0.5 11/25/2023 0810   GFRNONAA >60 01/10/2023 0820   GFRNONAA 84 11/18/2020 0809   GFRAA 97 11/18/2020 0809   Lab Results  Component Value Date   CHOL 159 11/25/2023   HDL 51 11/25/2023   LDLCALC 92 11/25/2023   TRIG 71 11/25/2023   CHOLHDL 3.1 11/25/2023   Lab Results  Component Value Date   HGBA1C 6.0 (H) 11/25/2023   Lab Results  Component Value Date   VITAMINB12 >2,000 (H) 11/25/2023   Lab Results  Component Value Date   TSH 1.17 04/26/2017      ASSESSMENT AND PLAN 58 y.o. year old male  has a past medical history of Arthritis, Borderline hyperglycemia, Headache(784.0), Hypercholesteremia, Hypertension, Metabolic syndrome, Shingles, Sleep apnea, Swelling, Testosterone   17-beta-dehydrogenase deficiency (HCC), and Wrist pain. here with:  OSA on CPAP  CPAP compliance excellent Residual AHI is good Encouraged patient to continue using CPAP nightly and > 4 hours each night F/U in 1 year or sooner if needed    Duwaine Russell, MSN, NP-C 09/04/2024, 2:45 PM [facility] [address], [address] [place], [place] [address] [phone]  The patient's condition requires frequent monitoring and adjustments in the treatment plan, reflecting the ongoing complexity of care.  This provider is the continuing focal point for all needed services for this condition.

## 2024-09-27 ENCOUNTER — Other Ambulatory Visit: Payer: Self-pay

## 2024-09-27 DIAGNOSIS — I10 Essential (primary) hypertension: Secondary | ICD-10-CM

## 2024-09-27 NOTE — Telephone Encounter (Signed)
 Prescription Request  09/27/2024  LOV: 04/23/24  What is the name of the medication or equipment? hydrochlorothiazide  (HYDRODIURIL ) 25 MG tablet [[id number]]   Have you contacted your pharmacy to request a refill? Yes   Which pharmacy would you like this sent to?  [facility] [place], [place] - [address] [place] [address] Phone: [phone] Fax: [phone]    Patient notified that their request is being sent to the clinical staff for review and that they should receive a response within 2 business days.   Please advise at [facility] [phone]

## 2024-09-28 MED ORDER — HYDROCHLOROTHIAZIDE 25 MG PO TABS: 25.0000 mg | ORAL_TABLET | Freq: Every day | ORAL | 0 refills | Status: DC

## 2024-09-28 NOTE — Telephone Encounter (Signed)
 Requested Prescriptions  Pending Prescriptions Disp Refills   hydrochlorothiazide  (HYDRODIURIL ) 25 MG tablet 90 tablet 0    Sig: Take 1 tablet (25 mg total) by mouth daily.     Cardiovascular: Diuretics - Thiazide Failed - 09/28/2024  4:23 PM      Failed - Cr in normal range and within 180 days    Creat  Date Value Ref Range Status  11/25/2023 1.06 0.70 - 1.30 mg/dL Final         Failed - K in normal range and within 180 days    Potassium  Date Value Ref Range Status  11/25/2023 4.1 3.5 - 5.3 mmol/L Final         Failed - Na in normal range and within 180 days    Sodium  Date Value Ref Range Status  11/25/2023 140 135 - 146 mmol/L Final         Failed - Valid encounter within last 6 months    Recent Outpatient Visits           4 months ago Dysuria   [facility] Kayla Jeoffrey RAMAN, FNP   5 months ago Dysuria   [facility] Aletha Bene, MD   10 months ago General medical exam   [place] [facility] Duanne Butler DASEN, MD   1 year ago General medical exam   [facility] [facility] Duanne Butler DASEN, MD       Future Appointments             In 11 months Sherryl Bouchard, NP  [facility]            Passed - Last BP in normal range    BP Readings from Last 1 Encounters:  05/03/24 126/84

## 2024-11-27 ENCOUNTER — Other Ambulatory Visit

## 2024-11-30 ENCOUNTER — Other Ambulatory Visit: Payer: Federal, State, Local not specified - PPO

## 2024-12-03 ENCOUNTER — Encounter: Payer: Federal, State, Local not specified - PPO | Admitting: Family Medicine

## 2024-12-15 ENCOUNTER — Other Ambulatory Visit: Payer: Self-pay | Admitting: Family Medicine

## 2024-12-17 ENCOUNTER — Other Ambulatory Visit: Payer: Self-pay | Admitting: Family Medicine

## 2024-12-17 DIAGNOSIS — I10 Essential (primary) hypertension: Secondary | ICD-10-CM

## 2024-12-27 ENCOUNTER — Other Ambulatory Visit

## 2024-12-27 DIAGNOSIS — Z0000 Encounter for general adult medical examination without abnormal findings: Secondary | ICD-10-CM

## 2024-12-27 DIAGNOSIS — E291 Testicular hypofunction: Secondary | ICD-10-CM

## 2024-12-27 DIAGNOSIS — R7303 Prediabetes: Secondary | ICD-10-CM

## 2024-12-27 DIAGNOSIS — I10 Essential (primary) hypertension: Secondary | ICD-10-CM

## 2024-12-28 ENCOUNTER — Encounter: Payer: Self-pay | Admitting: Family Medicine

## 2024-12-28 ENCOUNTER — Ambulatory Visit: Payer: Self-pay | Admitting: Family Medicine

## 2024-12-28 DIAGNOSIS — Z1211 Encounter for screening for malignant neoplasm of colon: Secondary | ICD-10-CM | POA: Insufficient documentation

## 2025-01-01 LAB — CBC WITH DIFFERENTIAL/PLATELET
Absolute Lymphocytes: 2689 {cells}/uL (ref 850–3900)
Absolute Monocytes: 842 {cells}/uL (ref 200–950)
Basophils Absolute: 41 {cells}/uL (ref 0–200)
Basophils Relative: 0.5 %
Eosinophils Absolute: 251 {cells}/uL (ref 15–500)
Eosinophils Relative: 3.1 %
HCT: 47.2 % (ref 39.4–51.1)
Hemoglobin: 15.4 g/dL (ref 13.2–17.1)
MCH: 30.3 pg (ref 27.0–33.0)
MCHC: 32.6 g/dL (ref 31.6–35.4)
MCV: 92.9 fL (ref 81.4–101.7)
MPV: 9.8 fL (ref 7.5–12.5)
Monocytes Relative: 10.4 %
Neutro Abs: 4277 {cells}/uL (ref 1500–7800)
Neutrophils Relative %: 52.8 %
Platelets: 347 Thousand/uL (ref 140–400)
RBC: 5.08 Million/uL (ref 4.20–5.80)
RDW: 13.2 % (ref 11.0–15.0)
Total Lymphocyte: 33.2 %
WBC: 8.1 Thousand/uL (ref 3.8–10.8)

## 2025-01-01 LAB — COMPLETE METABOLIC PANEL WITHOUT GFR
AG Ratio: 2 (calc) (ref 1.0–2.5)
ALT: 25 U/L (ref 9–46)
AST: 21 U/L (ref 10–35)
Albumin: 4.5 g/dL (ref 3.6–5.1)
Alkaline phosphatase (APISO): 86 U/L (ref 35–144)
BUN: 25 mg/dL (ref 7–25)
CO2: 29 mmol/L (ref 20–32)
Calcium: 9.2 mg/dL (ref 8.6–10.3)
Chloride: 101 mmol/L (ref 98–110)
Creat: 0.99 mg/dL (ref 0.70–1.35)
Globulin: 2.2 g/dL (ref 1.9–3.7)
Glucose, Bld: 88 mg/dL (ref 65–99)
Potassium: 4 mmol/L (ref 3.5–5.3)
Sodium: 140 mmol/L (ref 135–146)
Total Bilirubin: 0.5 mg/dL (ref 0.2–1.2)
Total Protein: 6.7 g/dL (ref 6.1–8.1)

## 2025-01-01 LAB — HEMOGLOBIN A1C
Hgb A1c MFr Bld: 5.8 % — ABNORMAL HIGH
Mean Plasma Glucose: 120 mg/dL
eAG (mmol/L): 6.6 mmol/L

## 2025-01-01 LAB — LIPID PANEL
Cholesterol: 163 mg/dL
HDL: 50 mg/dL
LDL Cholesterol (Calc): 94 mg/dL
Non-HDL Cholesterol (Calc): 113 mg/dL
Total CHOL/HDL Ratio: 3.3 (calc)
Triglycerides: 96 mg/dL

## 2025-01-01 LAB — TEST AUTHORIZATION

## 2025-01-01 LAB — PSA: PSA: 0.47 ng/mL

## 2025-01-03 ENCOUNTER — Ambulatory Visit: Admitting: Family Medicine

## 2025-01-03 ENCOUNTER — Encounter: Payer: Self-pay | Admitting: Family Medicine

## 2025-01-03 VITALS — BP 138/86 | HR 70 | Temp 98.2°F | Ht 65.0 in | Wt 271.4 lb

## 2025-01-03 DIAGNOSIS — I10 Essential (primary) hypertension: Secondary | ICD-10-CM | POA: Diagnosis not present

## 2025-01-03 DIAGNOSIS — R7303 Prediabetes: Secondary | ICD-10-CM

## 2025-01-03 DIAGNOSIS — Z23 Encounter for immunization: Secondary | ICD-10-CM

## 2025-01-03 DIAGNOSIS — Z0001 Encounter for general adult medical examination with abnormal findings: Secondary | ICD-10-CM | POA: Diagnosis not present

## 2025-01-03 DIAGNOSIS — Z0000 Encounter for general adult medical examination without abnormal findings: Secondary | ICD-10-CM

## 2025-01-03 NOTE — Addendum Note (Signed)
 Addended by: ANGELENA RONAL BRADLEY K on: 01/03/2025 02:54 PM   Modules accepted: Orders

## 2025-01-03 NOTE — Progress Notes (Signed)
 "  Subjective:    Patient ID: Brian Newton, male    DOB: May 24, 1966, 59 y.o.   MRN: [id number] Patient is a very pleasant 59 year old Caucasian male here today for complete physical exam.  His last colonoscopy was in 2019 and is due again in 2026.  His PSA was recently checked and was normal.  He has had his flu shot and his shingles shot at a different facility.  He is due for his pneumonia vaccine.  Diabetic foot exam was performed today and was normal.  Patient complains of bilateral shoulder pain and bilateral knee pain.  He has a history of a gastric bypass however he has gained weight over the last year due to inactivity brought on by his orthopedic surgery Immunization History  Administered Date(s) Administered   Influenza Inj Mdck Quad Pf 10/02/2018, 10/06/2018   Influenza,inj,Quad PF,6+ Mos 09/19/2014, 10/02/2015, 10/07/2016, 10/04/2017, 08/16/2019, 10/29/2020   Influenza-Unspecified 09/19/2018, 11/06/2021, 09/23/2023   Moderna Covid-19 Vaccine Bivalent Booster 58yrs & up 11/06/2021   Moderna Sars-Covid-2 Vaccination 09/23/2023   Tdap 06/20/2007, 11/02/2018   Lab on 12/27/2024  Component Date Value Ref Range Status   WBC 12/27/2024 8.1  3.8 - 10.8 Thousand/uL Final   RBC 12/27/2024 5.08  4.20 - 5.80 Million/uL Final   Hemoglobin 12/27/2024 15.4  13.2 - 17.1 g/dL Final   HCT 98/91/7973 47.2  39.4 - 51.1 % Final   MCV 12/27/2024 92.9  81.4 - 101.7 fL Final   MCH 12/27/2024 30.3  27.0 - 33.0 pg Final   MCHC 12/27/2024 32.6  31.6 - 35.4 g/dL Final   RDW 98/91/7973 13.2  11.0 - 15.0 % Final   Platelets 12/27/2024 347  140 - 400 Thousand/uL Final   MPV 12/27/2024 9.8  7.5 - 12.5 fL Final   Neutro Abs 12/27/2024 4,277  1,500 - 7,800 cells/uL Final   Absolute Lymphocytes 12/27/2024 2,689  850 - 3,900 cells/uL Final   Absolute Monocytes 12/27/2024 842  200 - 950 cells/uL Final   Eosinophils Absolute 12/27/2024 251  15 - 500 cells/uL Final   Basophils Absolute 12/27/2024 41  0 - 200  cells/uL Final   Neutrophils Relative % 12/27/2024 52.8  % Final   Total Lymphocyte 12/27/2024 33.2  % Final   Monocytes Relative 12/27/2024 10.4  % Final   Eosinophils Relative 12/27/2024 3.1  % Final   Basophils Relative 12/27/2024 0.5  % Final   Glucose, Bld 12/27/2024 88  65 - 99 mg/dL Final   Comment: .            Fasting reference interval .    BUN 12/27/2024 25  7 - 25 mg/dL Final   Creat 98/91/7973 0.99  0.70 - 1.35 mg/dL Final   BUN/Creatinine Ratio 12/27/2024 SEE NOTE:  6 - 22 (calc) Final   Comment:    Not Reported: BUN and Creatinine are within    reference range. .    Sodium 12/27/2024 140  135 - 146 mmol/L Final   Potassium 12/27/2024 4.0  3.5 - 5.3 mmol/L Final   Chloride 12/27/2024 101  98 - 110 mmol/L Final   CO2 12/27/2024 29  20 - 32 mmol/L Final   Calcium 12/27/2024 9.2  8.6 - 10.3 mg/dL Final   Total Protein 98/91/7973 6.7  6.1 - 8.1 g/dL Final   Albumin 98/91/7973 4.5  3.6 - 5.1 g/dL Final   Globulin 98/91/7973 2.2  1.9 - 3.7 g/dL (calc) Final   AG Ratio 12/27/2024 2.0  1.0 - 2.5 (  calc) Final   Total Bilirubin 12/27/2024 0.5  0.2 - 1.2 mg/dL Final   Alkaline phosphatase (APISO) 12/27/2024 86  35 - 144 U/L Final   AST 12/27/2024 21  10 - 35 U/L Final   ALT 12/27/2024 25  9 - 46 U/L Final   Cholesterol 12/27/2024 163  <200 mg/dL Final   HDL 98/91/7973 50  > OR = 40 mg/dL Final   Triglycerides 98/91/7973 96  <150 mg/dL Final   LDL Cholesterol (Calc) 12/27/2024 94  mg/dL (calc) Final   Comment: Reference range: <100 . Desirable range <100 mg/dL for primary prevention;   <70 mg/dL for patients with CHD or diabetic patients  with > or = 2 CHD risk factors. SABRA LDL-C is now calculated using the Martin-Hopkins  calculation, which is a validated novel method providing  better accuracy than the Friedewald equation in the  estimation of LDL-C.  Gladis APPLETHWAITE et al. SANDREA. 7986;689(80): 2061-2068  (http://education.[facility].com/faq/FAQ164)    Total CHOL/HDL  Ratio 12/27/2024 3.3  <5.0 (calc) Final   Non-HDL Cholesterol (Calc) 12/27/2024 113  <130 mg/dL (calc) Final   Comment: For patients with diabetes plus 1 major ASCVD risk  factor, treating to a non-HDL-C goal of <100 mg/dL  (LDL-C of <29 mg/dL) is considered a therapeutic  option.    PSA 12/27/2024 0.47  < OR = 4.00 ng/mL Final   Comment: The total PSA value from this assay system is  standardized against the WHO standard. The test  result will be approximately 20% lower when compared  to the equimolar-standardized total PSA (Beckman  Coulter). Comparison of serial PSA results should be  interpreted with this fact in mind. . This test was performed using the Siemens  chemiluminescent method. Values obtained from  different assay methods cannot be used interchangeably. PSA levels, regardless of value, should not be interpreted as absolute evidence of the presence or absence of disease.    Hgb A1c MFr Bld 12/27/2024 5.8 (H)  <5.7 % Final   Comment: For someone without known diabetes, a hemoglobin  A1c value between 5.7% and 6.4% is consistent with prediabetes and should be confirmed with a  follow-up test. . For someone with known diabetes, a value <7% indicates that their diabetes is well controlled. A1c targets should be individualized based on duration of diabetes, age, comorbid conditions, and other considerations. . This assay result is consistent with an increased risk of diabetes. . Currently, no consensus exists regarding use of hemoglobin A1c for diagnosis of diabetes for children. .    Mean Plasma Glucose 12/27/2024 120  mg/dL Final   eAG (mmol/L) 98/91/7973 6.6  mmol/L Final   TEST NAME: 12/27/2024 HEMOGLOBIN A1c WITH eAG   Final   TEST CODE: 12/27/2024 [id number]   Final   CLIENT CONTACT: 12/27/2024 SUZEN RUMPS   Final   REPORT ALWAYS MESSAGE SIGNATURE 12/27/2024    Final   Comment: . The laboratory testing on this patient was verbally requested or  confirmed by the ordering physician or his or her authorized representative after contact with an employee of [employer]. Federal regulations require that we maintain on file written authorization for all laboratory testing.  Accordingly we are asking that the ordering physician or his or her authorized representative sign a copy of this report and promptly return it to the client service representative. . . Signature:____________________________________________________ . Please fax this signed page to [phone] or return it via your [employer] courier.     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 180 capsule 1   aspirin -acetaminophen -caffeine  (EXCEDRIN MIGRAINE) 250-250-65 MG tablet Take 1 tablet by mouth every 6 (six) hours as needed for headache.     butalbital -acetaminophen -caffeine  (FIORICET) 50-325-40 MG tablet Take 1 tablet by mouth every 4 (four) hours as needed for headache. 20 tablet 0   Calcium Carb-Cholecalciferol (CALCIUM 600 + D PO) Take 1 tablet by mouth in the morning, at noon, and at bedtime.     Cholecalciferol (VITAMIN D ) 50 MCG (2000 UT) tablet Take 2,000 Units by mouth daily.     clotrimazole -betamethasone  (LOTRISONE ) cream APPLY TO THE AFFECTED AREA TOPICALLY TWICE DAILY. (Patient taking differently: Apply 1 Application topically 2 (two) times daily as needed (rash).) 45 g 0   Cyanocobalamin  (B-12) 5000 MCG CAPS Take 5,000 mcg by mouth daily.     cyclobenzaprine  (FLEXERIL ) 10 MG tablet Take 1 tablet (10 mg total) by  mouth 3 (three) times daily as needed. 30 tablet 0   diclofenac  (VOLTAREN ) 75 MG EC tablet Take 1 tablet (75 mg total) by mouth 2 (two) times daily. 60 tablet 3   diclofenac  Sodium (VOLTAREN ) 1 % GEL Apply 1 Application topically 4 (four) times daily as needed (pain).     ferrous sulfate 325 (65 FE) MG tablet Take 325 mg by mouth daily with breakfast.     hydrochlorothiazide  (HYDRODIURIL ) 25 MG tablet Take 1 tablet (25 mg total) by mouth daily. 90 tablet 0   loratadine (CLARITIN) 10 MG tablet Take 10 mg by mouth every morning.     Magnesium 400 MG TABS Take 400 mg by mouth daily.     Multiple Vitamins-Minerals (MULTIVITAMIN WITH MINERALS) tablet Take 1 tablet by mouth every morning.      sildenafil  (VIAGRA ) 100 MG tablet Take 0.5-1 tablets (50-100 mg total) by mouth daily as needed for erectile dysfunction. 5 tablet 11   No current facility-administered medications on file prior to visit.   Allergies  Allergen Reactions   Amoxicillin Rash   Social History   Socioeconomic History   Marital status: Married    Spouse name: Not on file   Number of children: 2   Years of education: HS   Highest education level: Some college, no degree  Occupational History   Not on file  Tobacco Use   Smoking status: Never   Smokeless tobacco: Never  Substance and Sexual Activity   Alcohol use: No    Alcohol/week: 0.0 standard drinks of alcohol   Drug use: No   Sexual activity: Yes  Other Topics Concern   Not on file  Social History Narrative   Denies caffeine  use    Social Drivers of Health   Tobacco Use: Low Risk (05/03/2024)   Patient History    Smoking Tobacco Use: Never    Smokeless Tobacco Use: Never    Passive Exposure: Not on file  Financial Resource Strain: Low Risk (01/02/2025)   Overall Financial Resource Strain (CARDIA)    Difficulty of Paying Living Expenses: Not  hard at all  Food Insecurity: No Food Insecurity (01/02/2025)   Epic    Worried About Programme Researcher, Broadcasting/film/video in the  Last Year: Never true    Ran Out of Food in the Last Year: Never true  Transportation Needs: No Transportation Needs (01/02/2025)   Epic    Lack of Transportation (Medical): No    Lack of Transportation (Non-Medical): No  Physical Activity: Inactive (01/02/2025)   Exercise Vital Sign    Days of Exercise per Week: 0 days    Minutes of Exercise per Session: Not on file  Stress: No Stress Concern Present (01/02/2025)   [employer] of Occupational Health - Occupational Stress Questionnaire    Feeling of Stress: Not at all  Social Connections: Socially Integrated (01/02/2025)   Social Connection and Isolation Panel    Frequency of Communication with Friends and Family: More than three times a week    Frequency of Social Gatherings with Friends and Family: Once a week    Attends Religious Services: More than 4 times per year    Active Member of [employer] or Organizations: Yes    Attends Banker Meetings: More than 4 times per year    Marital Status: Married  Catering Manager Violence: Not on file  Depression (PHQ2-9): Low Risk (01/03/2025)   Depression (PHQ2-9)    PHQ-2 Score: 0  Alcohol Screen: Not on file  Housing: Low Risk (01/02/2025)   Epic    Unable to Pay for Housing in the Last Year: No    Number of Times Moved in the Last Year: 0    Homeless in the Last Year: No  Utilities: Not on file  Health Literacy: Not on file      Review of Systems  All other systems reviewed and are negative.      Objective:   Physical Exam Vitals reviewed.  Constitutional:      General: He is not in acute distress.    Appearance: He is well-developed. He is not diaphoretic.  HENT:     Head: Normocephalic and atraumatic.     Right Ear: External ear normal.     Left Ear: External ear normal.     Nose: Nose normal.     Mouth/Throat:     Pharynx: No oropharyngeal exudate.  Eyes:     General: No scleral icterus.       Right eye: No discharge.        Left eye: No discharge.      Conjunctiva/sclera: Conjunctivae normal.     Pupils: Pupils are equal, round, and reactive to light.  Neck:     Thyroid : No thyromegaly.     Vascular: No JVD.     Trachea: No tracheal deviation.  Cardiovascular:     Rate and Rhythm: Normal rate and regular rhythm.     Heart sounds: Normal heart sounds. No murmur heard.    No friction rub. No gallop.  Pulmonary:     Effort: Pulmonary effort is normal. No respiratory distress.     Breath sounds: Normal breath sounds. No wheezing or rales.  Chest:     Chest wall: No tenderness.  Abdominal:     General: Bowel sounds are normal. There is no distension.     Palpations: Abdomen is soft. There is no mass.     Tenderness: There is no abdominal tenderness. There is no guarding or rebound.  Musculoskeletal:        General: No tenderness or deformity. Normal range  of motion.     Cervical back: Neck supple.  Lymphadenopathy:     Cervical: No cervical adenopathy.  Skin:    General: Skin is warm.     Coloration: Skin is not pale.     Findings: No erythema or rash.  Neurological:     Mental Status: He is alert and oriented to person, place, and time.     Cranial Nerves: No cranial nerve deficit.     Motor: No abnormal muscle tone.     Coordination: Coordination normal.     Deep Tendon Reflexes: Reflexes are normal and symmetric.  Psychiatric:        Behavior: Behavior normal.        Thought Content: Thought content normal.        Judgment: Judgment normal.         Assessment & Plan:  General medical exam  Prediabetes  Benign essential HTN Patient's physical exam today is normal outside of his elevated BMI.-My biggest concern for this patient.  His blood pressure and cholesterol and diabetes are well-controlled.  Recommended exercise diet and weight loss.  We discussed GLP-1 medication but he defers that at the present time.  He received Prevnar 20 today.  The remainder of his vaccinations are up-to-date.  He is waiting on his  gastroenterologist to contact him this year for his colonoscopy as it is due.  PSA is normal "

## 2025-09-05 ENCOUNTER — Telehealth: Admitting: Adult Health

## 2026-01-03 ENCOUNTER — Other Ambulatory Visit

## 2026-01-06 ENCOUNTER — Encounter: Admitting: Family Medicine
